# Patient Record
Sex: Male | Born: 1960 | Race: Black or African American | Hispanic: No | Marital: Single | Smoking: Current some day smoker
Health system: Southern US, Community
[De-identification: ages and names within clinical notes are randomized; demographics above are authoritative.]

## PROBLEM LIST (undated history)

## (undated) DIAGNOSIS — G4730 Sleep apnea, unspecified: Secondary | ICD-10-CM

## (undated) DIAGNOSIS — J45909 Unspecified asthma, uncomplicated: Secondary | ICD-10-CM

## (undated) DIAGNOSIS — Z789 Other specified health status: Secondary | ICD-10-CM

## (undated) HISTORY — PX: APPENDECTOMY: SHX54

---
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
## 2003-09-15 ENCOUNTER — Emergency Department (HOSPITAL_COMMUNITY): Admission: AD | Admit: 2003-09-15 | Discharge: 2003-09-15 | Payer: Self-pay | Admitting: Family Medicine

## 2003-09-17 ENCOUNTER — Emergency Department (HOSPITAL_COMMUNITY): Admission: EM | Admit: 2003-09-17 | Discharge: 2003-09-17 | Payer: Self-pay | Admitting: Emergency Medicine

## 2008-10-30 ENCOUNTER — Emergency Department (HOSPITAL_COMMUNITY): Admission: EM | Admit: 2008-10-30 | Discharge: 2008-10-30 | Payer: Self-pay | Admitting: Emergency Medicine

## 2010-12-15 ENCOUNTER — Emergency Department (HOSPITAL_BASED_OUTPATIENT_CLINIC_OR_DEPARTMENT_OTHER)
Admission: EM | Admit: 2010-12-15 | Discharge: 2010-12-15 | Disposition: A | Discharge status: Home / Self Care | Payer: Self-pay | Attending: Emergency Medicine | Admitting: Emergency Medicine

## 2010-12-15 DIAGNOSIS — J039 Acute tonsillitis, unspecified: Secondary | ICD-10-CM | POA: Insufficient documentation

## 2011-05-09 ENCOUNTER — Encounter: Payer: Self-pay | Admitting: *Deleted

## 2011-05-09 ENCOUNTER — Emergency Department (HOSPITAL_BASED_OUTPATIENT_CLINIC_OR_DEPARTMENT_OTHER)
Admission: EM | Admit: 2011-05-09 | Discharge: 2011-05-09 | Disposition: A | Discharge status: Home / Self Care | Payer: Self-pay | Attending: Emergency Medicine | Admitting: Emergency Medicine

## 2011-05-09 DIAGNOSIS — M7060 Trochanteric bursitis, unspecified hip: Secondary | ICD-10-CM

## 2011-05-09 DIAGNOSIS — M76899 Other specified enthesopathies of unspecified lower limb, excluding foot: Secondary | ICD-10-CM | POA: Insufficient documentation

## 2011-05-09 DIAGNOSIS — M549 Dorsalgia, unspecified: Secondary | ICD-10-CM | POA: Insufficient documentation

## 2011-05-09 DIAGNOSIS — A5903 Trichomonal cystitis and urethritis: Secondary | ICD-10-CM

## 2011-05-09 DIAGNOSIS — M5430 Sciatica, unspecified side: Secondary | ICD-10-CM | POA: Insufficient documentation

## 2011-05-09 DIAGNOSIS — M25559 Pain in unspecified hip: Secondary | ICD-10-CM | POA: Insufficient documentation

## 2011-05-09 DIAGNOSIS — N342 Other urethritis: Secondary | ICD-10-CM | POA: Insufficient documentation

## 2011-05-09 LAB — URINE MICROSCOPIC-ADD ON

## 2011-05-09 LAB — URINALYSIS, ROUTINE W REFLEX MICROSCOPIC
Nitrite: NEGATIVE
Specific Gravity, Urine: 1.015 (ref 1.005–1.030)
Urobilinogen, UA: 0.2 mg/dL (ref 0.0–1.0)
pH: 5.5 (ref 5.0–8.0)

## 2011-05-09 MED ORDER — HYDROCODONE-ACETAMINOPHEN 10-325 MG PO TABS: 1.0000 | ORAL_TABLET | Freq: Four times a day (QID) | ORAL | Status: AC | PRN

## 2011-05-09 MED ORDER — NAPROXEN SODIUM 220 MG PO TABS: 440.0000 mg | ORAL_TABLET | Freq: Two times a day (BID) | ORAL | Status: DC

## 2011-05-09 MED ORDER — METRONIDAZOLE 500 MG PO TABS
ORAL_TABLET | ORAL | Status: AC
  Filled 2011-05-09: qty 4

## 2011-05-09 MED ORDER — METRONIDAZOLE 500 MG PO TABS
2000.0000 mg | ORAL_TABLET | Freq: Once | ORAL | Status: AC
  Administered 2011-05-09: 2000 mg via ORAL

## 2011-05-09 NOTE — ED Provider Notes (Addendum)
History     Chief Complaint  Patient presents with  . Hip Pain  . Back Pain   Patient is a 50 y.o. male presenting with back pain. The history is provided by the patient.  Back Pain  This is a new problem. The current episode started more than 2 days ago. The problem has been gradually worsening. The pain is associated with no known injury. The pain is present in the sacro-iliac joint. The quality of the pain is described as shooting. Radiates to: right buttock and thigh. Pain severity now: moderate to severe. Exacerbated by: lying still, walking, palpation. Pertinent negatives include no numbness, no perianal numbness, no bladder incontinence, no dysuria and no weakness. He has tried NSAIDs for the symptoms. The treatment provided no relief.    History reviewed. No pertinent past medical history.  Past Surgical History  Procedure Date  . Appendectomy     History reviewed. No pertinent family history.  History  Substance Use Topics  . Smoking status: Passive Smoker  . Smokeless tobacco: Not on file  . Alcohol Use: Yes      Review of Systems  Genitourinary: Positive for discharge. Negative for bladder incontinence and dysuria.  Musculoskeletal: Positive for back pain.       Right greater trochanteric pain, improving  Neurological: Negative for weakness and numbness.  All other systems reviewed and are negative.    Physical Exam  BP 159/97  Pulse 79  Temp(Src) 98 F (36.7 C) (Oral)  Resp 18  Ht 6\' 2"  (1.88 m)  Wt 230 lb (104.327 kg)  BMI 29.53 kg/m2  SpO2 100%  Physical Exam  Constitutional: He appears well-developed and well-nourished.  HENT:  Head: Normocephalic and atraumatic.  Eyes: EOM are normal. Pupils are equal, round, and reactive to light.  Neck: Normal range of motion. Neck supple.  Cardiovascular: Normal rate and regular rhythm.   Pulmonary/Chest: Effort normal. No respiratory distress.  Abdominal: Soft. He exhibits no distension.  Genitourinary:  Uncircumcised. Discharge found.  Musculoskeletal: Normal range of motion.       Right SI joint tenderness; mild right greater trochanteric tenderness  Neurological: He is alert. Coordination normal.  Skin: Skin is warm and dry.    ED Course  Procedures  MDM       Hanley Seamen, MD 05/09/11 [phone]  Trichomoniasis noted on U/A--will treat and advised him to have partner(s) treated.   Hanley Seamen, MD 05/09/11 0425

## 2011-05-09 NOTE — ED Notes (Signed)
C/O lower back pain and right hip pain since Thursday, denies injury

## 2011-05-10 LAB — GC/CHLAMYDIA PROBE AMP, GENITAL: Chlamydia, DNA Probe: NEGATIVE

## 2011-08-05 LAB — GC/CHLAMYDIA PROBE AMP, GENITAL: Chlamydia, DNA Probe: NEGATIVE

## 2011-08-05 LAB — RPR: RPR Ser Ql: NONREACTIVE

## 2011-08-16 ENCOUNTER — Emergency Department (HOSPITAL_COMMUNITY)
Admission: EM | Admit: 2011-08-16 | Discharge: 2011-08-16 | Disposition: A | Discharge status: Home / Self Care | Payer: Self-pay | Attending: Emergency Medicine | Admitting: Emergency Medicine

## 2011-08-16 DIAGNOSIS — M549 Dorsalgia, unspecified: Secondary | ICD-10-CM | POA: Insufficient documentation

## 2011-08-18 ENCOUNTER — Ambulatory Visit (INDEPENDENT_AMBULATORY_CARE_PROVIDER_SITE_OTHER): Payer: Self-pay | Admitting: Family Medicine

## 2011-08-18 ENCOUNTER — Ambulatory Visit (HOSPITAL_BASED_OUTPATIENT_CLINIC_OR_DEPARTMENT_OTHER)
Admission: RE | Admit: 2011-08-18 | Discharge: 2011-08-18 | Disposition: A | Discharge status: Home / Self Care | Payer: Self-pay | Source: Ambulatory Visit | Attending: Family Medicine | Admitting: Family Medicine

## 2011-08-18 ENCOUNTER — Encounter: Payer: Self-pay | Admitting: Family Medicine

## 2011-08-18 VITALS — BP 149/85 | HR 93 | Temp 98.5°F | Ht 74.0 in | Wt 220.0 lb

## 2011-08-18 DIAGNOSIS — M545 Low back pain: Secondary | ICD-10-CM

## 2011-08-18 DIAGNOSIS — M549 Dorsalgia, unspecified: Secondary | ICD-10-CM

## 2011-08-18 DIAGNOSIS — M47817 Spondylosis without myelopathy or radiculopathy, lumbosacral region: Secondary | ICD-10-CM | POA: Insufficient documentation

## 2011-08-18 MED ORDER — CYCLOBENZAPRINE HCL 10 MG PO TABS: 10.0000 mg | ORAL_TABLET | Freq: Three times a day (TID) | ORAL | Status: AC | PRN

## 2011-08-18 MED ORDER — MELOXICAM 15 MG PO TABS: 15.0000 mg | ORAL_TABLET | Freq: Every day | ORAL | Status: AC

## 2011-08-18 MED ORDER — HYDROCODONE-ACETAMINOPHEN 5-500 MG PO TABS: 1.0000 | ORAL_TABLET | Freq: Four times a day (QID) | ORAL | Status: AC | PRN

## 2011-08-18 NOTE — Patient Instructions (Signed)
Your exam and history is consistent with sacroiliitis but you also have signs of an irritated nerve in your low back. Continue the prednisone as you have been. After this is gone, start meloxicam 15mg  daily with food. Take flexeril as needed for muscle spasms. Vicodin as needed for pain (no driving on this medicine). Start physical therapy for both your SI joint and irritated nerve in your low back. Strengthening of low back muscles, abdominal musculature are key for long term pain relief. Follow up with me in 1 month. If not improving, will consider further imaging (MRI) and/or other medications (neurontin, lyrica, nortriptyline) that help with pain.

## 2011-08-21 ENCOUNTER — Encounter: Payer: Self-pay | Admitting: Family Medicine

## 2011-08-21 NOTE — Progress Notes (Signed)
  Subjective:    Patient ID: Allen Sawyer, male    DOB: 1961/02/17, 50 y.o.   MRN: [id number]  PCP: None  HPI 50 yo M here with right low back, hip pain.  Patient reports off and on low back pain since age 50. Has become more intense and worsened over past 2 years. Feels pain mostly in right low back into right hip. Pain is better sleeping on his right side though. Some numbness into right leg posteriorly. No bowel/bladder dysfunction. Had x-rays on back a long time ago - questionable 'tiny fractures' in tailbone Has not done PT, had back injections. Starting to feel pain going into left buttock now too. Went to ED on 10/16 - given prednisone dose pack and norco for pain.  History reviewed. No pertinent past medical history.  Current Outpatient Prescriptions on File Prior to Visit  Medication Sig Dispense Refill  . ibuprofen (ADVIL,MOTRIN) 800 MG tablet Take 800 mg by mouth every 8 (eight) hours as needed.          Past Surgical History  Procedure Date  . Appendectomy     No Known Allergies  History   Social History  . Marital Status: Married    Spouse Name: N/A    Number of Children: N/A  . Years of Education: N/A   Occupational History  . Not on file.   Social History Main Topics  . Smoking status: Current Everyday Smoker    Types: Cigarettes  . Smokeless tobacco: Not on file   Comment: 3 cigarettes per day  . Alcohol Use: Yes  . Drug Use: Yes    Special: Marijuana  . Sexually Active: Not on file   Other Topics Concern  . Not on file   Social History Narrative  . No narrative on file    Family History  Problem Relation Age of Onset  . Hyperlipidemia Mother   . Hypertension Mother   . Hyperlipidemia Father   . Hypertension Father   . Heart attack Father   . Hyperlipidemia Sister   . Hypertension Sister   . Hypertension Brother   . Hyperlipidemia Brother   . Diabetes Brother   . Heart attack Paternal Uncle   . Sudden death Neg Hx     BP  149/85  Pulse 93  Temp(Src) 98.5 F (36.9 C) (Oral)  Ht 6\' 2"  (1.88 m)  Wt 220 lb (99.791 kg)  BMI 28.25 kg/m2   Review of Systems See HPI above.    Objective:   Physical Exam Gen: NAD Back: No gross deformity, scoliosis. TTP R lumbar paraspinal region and buttock.  TTP at R SI joint.  No midline or bony TTP.  No trochanteric TTP.  No groin or other TTP. FROM with mild pain on full flexion and extension. Strength LEs 5/5 all muscle groups.   2+ MSRs in patellar and achilles tendons, equal bilaterally. Negative SLRs. Sensation intact to light touch bilaterally. Negative logroll bilateral hips Fabers reproduces pain at [facility] joint on right.  Negative left fabers and bilateral piriformis stretches.    Assessment & Plan:  1. Low back pain - most consistent with sacroiliac dysfunction though he also describes some symptoms of lumbar radiculopathy.  Continue with prednisone dose pack then transition to mobic.  Flexeril for spasms and vicodin for severe pain.  Start PT for both of the above.  F/u in 1 month for recheck.  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

## 2011-08-22 ENCOUNTER — Ambulatory Visit: Payer: Self-pay | Admitting: Physical Therapy

## 2011-08-22 DIAGNOSIS — M549 Dorsalgia, unspecified: Secondary | ICD-10-CM | POA: Insufficient documentation

## 2011-08-22 NOTE — Assessment & Plan Note (Signed)
most consistent with sacroiliac dysfunction though he also describes some symptoms of lumbar radiculopathy.  Continue with prednisone dose pack then transition to mobic.  Flexeril for spasms and vicodin for severe pain.  Start PT for both of the above.  F/u in 1 month for recheck.  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

## 2012-02-03 ENCOUNTER — Encounter (HOSPITAL_BASED_OUTPATIENT_CLINIC_OR_DEPARTMENT_OTHER): Payer: Self-pay | Admitting: Emergency Medicine

## 2012-02-03 ENCOUNTER — Emergency Department (HOSPITAL_BASED_OUTPATIENT_CLINIC_OR_DEPARTMENT_OTHER)
Admission: EM | Admit: 2012-02-03 | Discharge: 2012-02-03 | Disposition: A | Discharge status: Home / Self Care | Payer: Self-pay | Attending: Emergency Medicine | Admitting: Emergency Medicine

## 2012-02-03 ENCOUNTER — Emergency Department (INDEPENDENT_AMBULATORY_CARE_PROVIDER_SITE_OTHER): Payer: Self-pay

## 2012-02-03 DIAGNOSIS — R05 Cough: Secondary | ICD-10-CM

## 2012-02-03 DIAGNOSIS — J3489 Other specified disorders of nose and nasal sinuses: Secondary | ICD-10-CM

## 2012-02-03 DIAGNOSIS — J40 Bronchitis, not specified as acute or chronic: Secondary | ICD-10-CM | POA: Insufficient documentation

## 2012-02-03 DIAGNOSIS — R0989 Other specified symptoms and signs involving the circulatory and respiratory systems: Secondary | ICD-10-CM

## 2012-02-03 MED ORDER — IPRATROPIUM BROMIDE 0.02 % IN SOLN
0.5000 mg | Freq: Once | RESPIRATORY_TRACT | Status: AC
  Administered 2012-02-03: 0.5 mg via RESPIRATORY_TRACT
  Filled 2012-02-03: qty 2.5

## 2012-02-03 MED ORDER — ALBUTEROL SULFATE (5 MG/ML) 0.5% IN NEBU
5.0000 mg | INHALATION_SOLUTION | Freq: Once | RESPIRATORY_TRACT | Status: AC
  Administered 2012-02-03: 5 mg via RESPIRATORY_TRACT
  Filled 2012-02-03: qty 1

## 2012-02-03 MED ORDER — ALBUTEROL SULFATE HFA 108 (90 BASE) MCG/ACT IN AERS
2.0000 | INHALATION_SPRAY | RESPIRATORY_TRACT | Status: DC | PRN
  Administered 2012-02-03: 2 via RESPIRATORY_TRACT
  Filled 2012-02-03: qty 6.7

## 2012-02-03 NOTE — Discharge Instructions (Signed)
Bronchitis Bronchitis is the body's way of reacting to injury and/or infection (inflammation) of the bronchi. Bronchi are the air tubes that extend from the windpipe into the lungs. If the inflammation becomes severe, it may cause shortness of breath. CAUSES  Inflammation may be caused by:  A virus.   Germs (bacteria).   Dust.   Allergens.   Pollutants and many other irritants.  The cells lining the bronchial tree are covered with tiny hairs (cilia). These constantly beat upward, away from the lungs, toward the mouth. This keeps the lungs free of pollutants. When these cells become too irritated and are unable to do their job, mucus begins to develop. This causes the characteristic cough of bronchitis. The cough clears the lungs when the cilia are unable to do their job. Without either of these protective mechanisms, the mucus would settle in the lungs. Then you would develop pneumonia. Smoking is a common cause of bronchitis and can contribute to pneumonia. Stopping this habit is the single most important thing you can do to help yourself. TREATMENT   Your caregiver may prescribe an antibiotic if the cough is caused by bacteria. Also, medicines that open up your airways make it easier to breathe. Your caregiver may also recommend or prescribe an expectorant. It will loosen the mucus to be coughed up. Only take over-the-counter or prescription medicines for pain, discomfort, or fever as directed by your caregiver.   Removing whatever causes the problem (smoking, for example) is critical to preventing the problem from getting worse.   Cough suppressants may be prescribed for relief of cough symptoms.   Inhaled medicines may be prescribed to help with symptoms now and to help prevent problems from returning.   For those with recurrent (chronic) bronchitis, there may be a need for steroid medicines.  SEEK IMMEDIATE MEDICAL CARE IF:   During treatment, you develop more pus-like mucus  (purulent sputum).   You become progressively more ill.   You have increased difficulty breathing, wheezing, or shortness of breath.   MAKE SURE YOU:   Understand these instructions.   Will watch your condition.   Will get help right away if you are not doing well or get worse.  Document Released: 10/17/2005 Document Revised: 10/06/2011 Document Reviewed: 08/26/2008 [employer] Patient Information 2012 [employer]. 

## 2012-02-03 NOTE — ED Provider Notes (Addendum)
History     CSN: [id number]  Arrival date & time 02/03/12  0247   First MD Initiated Contact with Patient 02/03/12 0255      Chief Complaint  Patient presents with  . Cough    (Consider location/radiation/quality/duration/timing/severity/associated sxs/prior treatment) HPI Is a 51 year old black male. He states he had the flu in December. He states that most the symptoms resolved but since then he's had persistent postnasal drip and a cough. He also become short of breath at times although he has not noticed that this is exacerbated by exertion. He has tried Mucinex without relief. The symptoms are moderate but not severe. He denies productive cough. He is a smoker but states he only smokes a few cigarettes every day.  History reviewed. No pertinent past medical history.  Past Surgical History  Procedure Date  . Appendectomy     Family History  Problem Relation Age of Onset  . Hyperlipidemia Mother   . Hypertension Mother   . Hyperlipidemia Father   . Hypertension Father   . Heart attack Father   . Hyperlipidemia Sister   . Hypertension Sister   . Hypertension Brother   . Hyperlipidemia Brother   . Diabetes Brother   . Heart attack Paternal Uncle   . Sudden death Neg Hx     History  Substance Use Topics  . Smoking status: Current Everyday Smoker    Types: Cigarettes  . Smokeless tobacco: Not on file   Comment: 3 cigarettes per day  . Alcohol Use: Yes      Review of Systems  All other systems reviewed and are negative.    Allergies  Review of patient's allergies indicates no known allergies.  Home Medications   Current Outpatient Rx  Name Route Sig Dispense Refill  . IBUPROFEN 800 MG PO TABS Oral Take 800 mg by mouth every 8 (eight) hours as needed.      . CYCLOBENZAPRINE HCL 10 MG PO TABS Oral Take 1 tablet (10 mg total) by mouth every 8 (eight) hours as needed for muscle spasms. 60 tablet 1  . MELOXICAM 15 MG PO TABS Oral Take 1 tablet (15 mg total)  by mouth daily. With food.  Start AFTER finishing prednisone. 30 tablet 1    BP 143/96  Pulse 75  Temp(Src) 98.2 F (36.8 C) (Oral)  Resp 20  Wt 215 lb (97.523 kg)  SpO2 98%  Physical Exam General: Well-developed, well-nourished male in no acute distress; appearance consistent with age of record HENT: normocephalic, atraumatic; no pharyngeal erythema or exudate; normal-appearing nasal mucosa Eyes: pupils equal round and reactive to light; extraocular muscles intact Neck: supple Heart: regular rate and rhythm Lungs: Wheezing on inspiration and expiration Abdomen: soft; nondistended Extremities: No deformity; full range of motion Neurologic: Awake, alert and oriented; motor function intact in all extremities and symmetric; no facial droop Skin: Warm and dry Psychiatric: Normal mood and affect    ED Course  Procedures (including critical care time)     MDM  3:49 AM Lungs clear after albuterol and Atrovent neb treatment.   Dg Chest 2 View  02/03/2012  *RADIOLOGY REPORT*  Clinical Data: Cough, congestion, sinus drainage.  CHEST - 2 VIEW  Comparison: 10/30/2008  Findings: Normal heart size and pulmonary vascularity.  Lungs appear clear expanded.  No focal airspace consolidation.  No blunting of costophrenic angles.  No pneumothorax.  Mild hyperinflation.  Degenerative changes in the spine.  No significant change since previous study.  IMPRESSION: No evidence of  active pulmonary disease.  Original Report Authenticated By: Marlon Pel, M.D.       Hanley Seamen, MD 02/03/12 0345  Hanley Seamen, MD 02/03/12 [phone]

## 2012-02-03 NOTE — ED Notes (Signed)
Pt c/o sinus drainage and cough since jan.

## 2014-02-28 ENCOUNTER — Emergency Department (HOSPITAL_BASED_OUTPATIENT_CLINIC_OR_DEPARTMENT_OTHER): Payer: No Typology Code available for payment source

## 2014-02-28 ENCOUNTER — Emergency Department (HOSPITAL_BASED_OUTPATIENT_CLINIC_OR_DEPARTMENT_OTHER)
Admission: EM | Admit: 2014-02-28 | Discharge: 2014-02-28 | Disposition: A | Discharge status: Home / Self Care | Payer: No Typology Code available for payment source | Attending: Emergency Medicine | Admitting: Emergency Medicine

## 2014-02-28 ENCOUNTER — Encounter (HOSPITAL_BASED_OUTPATIENT_CLINIC_OR_DEPARTMENT_OTHER): Payer: Self-pay | Admitting: Emergency Medicine

## 2014-02-28 DIAGNOSIS — J40 Bronchitis, not specified as acute or chronic: Secondary | ICD-10-CM

## 2014-02-28 DIAGNOSIS — J209 Acute bronchitis, unspecified: Secondary | ICD-10-CM | POA: Insufficient documentation

## 2014-02-28 DIAGNOSIS — Z87891 Personal history of nicotine dependence: Secondary | ICD-10-CM | POA: Insufficient documentation

## 2014-02-28 MED ORDER — AZITHROMYCIN 250 MG PO TABS: ORAL_TABLET | ORAL | Status: DC

## 2014-02-28 MED ORDER — PREDNISONE 50 MG PO TABS
60.0000 mg | ORAL_TABLET | Freq: Once | ORAL | Status: AC
  Administered 2014-02-28: 60 mg via ORAL
  Filled 2014-02-28 (×2): qty 1

## 2014-02-28 MED ORDER — PREDNISONE 20 MG PO TABS: ORAL_TABLET | ORAL | Status: DC

## 2014-02-28 MED ORDER — ALBUTEROL SULFATE HFA 108 (90 BASE) MCG/ACT IN AERS
2.0000 | INHALATION_SPRAY | Freq: Once | RESPIRATORY_TRACT | Status: AC
Start: 2014-02-28 — End: 2014-02-28
  Administered 2014-02-28: 2 via RESPIRATORY_TRACT
  Filled 2014-02-28: qty 6.7

## 2014-02-28 NOTE — ED Provider Notes (Signed)
CSN: [id number]     Arrival date & time 02/28/14  1400 History   First MD Initiated Contact with Patient 02/28/14 1412     Chief Complaint  Patient presents with  . Cough     (Consider location/radiation/quality/duration/timing/severity/associated sxs/prior Treatment) HPI Comments: Patient presents with cough and congestion. He states it's been gone on for 2-3 weeks. His cough is productive of some white sputum. He denies any chest pain. He has some shortness of breath during coughing spells. He's also noticed some wheezing. He smokes occasionally. He denies any fevers or chills. He denies any vomiting or diarrhea. He has had some problems with allergies and is been using Allegra for that. This includes rhinorrhea and postnasal drip. He denies a sore throat.  Patient is a 53 y.o. male presenting with cough.  Cough Associated symptoms: rhinorrhea and wheezing   Associated symptoms: no chest pain, no chills, no diaphoresis, no fever, no headaches, no rash and no shortness of breath     History reviewed. No pertinent past medical history. Past Surgical History  Procedure Laterality Date  . Appendectomy     Family History  Problem Relation Age of Onset  . Hyperlipidemia Mother   . Hypertension Mother   . Hyperlipidemia Father   . Hypertension Father   . Heart attack Father   . Hyperlipidemia Sister   . Hypertension Sister   . Hypertension Brother   . Hyperlipidemia Brother   . Diabetes Brother   . Heart attack Paternal Uncle   . Sudden death Neg Hx    History  Substance Use Topics  . Smoking status: Former Games developermoker  . Smokeless tobacco: Not on file     Comment: 3 cigarettes per day  . Alcohol Use: Yes    Review of Systems  Constitutional: Negative for fever, chills, diaphoresis and fatigue.  HENT: Positive for congestion, postnasal drip, rhinorrhea and sneezing.   Eyes: Negative.   Respiratory: Positive for cough and wheezing. Negative for chest tightness and shortness of  breath.   Cardiovascular: Negative for chest pain and leg swelling.  Gastrointestinal: Negative for nausea, vomiting, abdominal pain, diarrhea and blood in stool.  Genitourinary: Negative for frequency, hematuria, flank pain and difficulty urinating.  Musculoskeletal: Negative for arthralgias and back pain.  Skin: Negative for rash.  Neurological: Negative for dizziness, speech difficulty, weakness, numbness and headaches.      Allergies  Review of patient's allergies indicates no known allergies.  Home Medications   Prior to Admission medications   Medication Sig Start Date End Date Taking? Authorizing Provider  Fexofenadine-Pseudoephedrine (ALLEGRA-D PO) Take by mouth.   Yes Historical Provider, MD  ibuprofen (ADVIL,MOTRIN) 800 MG tablet Take 800 mg by mouth every 8 (eight) hours as needed.      Historical Provider, MD   BP 165/97  Pulse 88  Temp(Src) 98.2 F (36.8 C) (Oral)  Resp 18  Ht 6\' 2"  (1.88 m)  Wt 215 lb (97.523 kg)  BMI 27.59 kg/m2  SpO2 95% Physical Exam  Constitutional: He is oriented to person, place, and time. He appears well-developed and well-nourished.  HENT:  Head: Normocephalic and atraumatic.  Mouth/Throat: Oropharynx is clear and moist.  Eyes: Pupils are equal, round, and reactive to light.  Neck: Normal range of motion. Neck supple.  Cardiovascular: Normal rate, regular rhythm and normal heart sounds.   Pulmonary/Chest: Effort normal. No respiratory distress. He has wheezes (few scarce wheezes). He has no rales. He exhibits no tenderness.  Abdominal: Soft. Bowel sounds are  normal. There is no tenderness. There is no rebound and no guarding.  Musculoskeletal: Normal range of motion. He exhibits no edema.  No calf tenderness  Lymphadenopathy:    He has no cervical adenopathy.  Neurological: He is alert and oriented to person, place, and time.  Skin: Skin is warm and dry. No rash noted.  Psychiatric: He has a normal mood and affect.    ED Course   Procedures (including critical care time) Labs Review Labs Reviewed - No data to display  Imaging Review Dg Chest 2 View  02/28/2014   CLINICAL DATA:  Cough and congestion.  EXAM: CHEST  2 VIEW  COMPARISON:  02/03/2012 and prior chest radiographs  FINDINGS: The cardiomediastinal silhouette is unremarkable.  Lungs are clear.  There is no evidence of focal airspace disease, pulmonary edema, suspicious pulmonary nodule/mass, pleural effusion, or pneumothorax. No acute bony abnormalities are identified.  IMPRESSION: No active cardiopulmonary disease.   Electronically Signed   By: Laveda AbbeJeff  Hu M.D.   On: 02/28/2014 15:14     EKG Interpretation None      MDM   Final diagnoses:  Bronchitis    Patient presents with a 2 to three-week history of worsening cough and wheezing. He had some mild wheezing on exam and he was given an albuterol inhaler to use at home. He was given a five-day course of prednisone. His chest x-ray is negative for pneumonia but given the duration of symptoms, we'll go ahead and treated with Zithromax for possible atypical pneumonia or bronchitis. He was discharged in good condition I personally called the primary care physician or return here as needed if his symptoms worsen.    Rolan BuccoMelanie Angelie Kram, MD 02/28/14 [phone]

## 2014-02-28 NOTE — ED Notes (Signed)
C/o prod cough x 2-3 weeks

## 2014-02-28 NOTE — ED Notes (Signed)
MD at bedside. 

## 2014-02-28 NOTE — Discharge Instructions (Signed)

## 2015-03-17 ENCOUNTER — Emergency Department (HOSPITAL_BASED_OUTPATIENT_CLINIC_OR_DEPARTMENT_OTHER)
Admission: EM | Admit: 2015-03-17 | Discharge: 2015-03-17 | Discharge status: Against Medical Advice | Payer: No Typology Code available for payment source | Attending: Emergency Medicine | Admitting: Emergency Medicine

## 2015-03-17 ENCOUNTER — Encounter (HOSPITAL_BASED_OUTPATIENT_CLINIC_OR_DEPARTMENT_OTHER): Payer: Self-pay

## 2015-03-17 DIAGNOSIS — R062 Wheezing: Secondary | ICD-10-CM | POA: Insufficient documentation

## 2015-03-17 DIAGNOSIS — R059 Cough, unspecified: Secondary | ICD-10-CM

## 2015-03-17 DIAGNOSIS — Z8709 Personal history of other diseases of the respiratory system: Secondary | ICD-10-CM | POA: Insufficient documentation

## 2015-03-17 DIAGNOSIS — R05 Cough: Secondary | ICD-10-CM | POA: Insufficient documentation

## 2015-03-17 DIAGNOSIS — Z87891 Personal history of nicotine dependence: Secondary | ICD-10-CM | POA: Insufficient documentation

## 2015-03-17 MED ORDER — ALBUTEROL SULFATE HFA 108 (90 BASE) MCG/ACT IN AERS
2.0000 | INHALATION_SPRAY | Freq: Once | RESPIRATORY_TRACT | Status: AC
Start: 2015-03-17 — End: 2015-03-17
  Administered 2015-03-17: 2 via RESPIRATORY_TRACT
  Filled 2015-03-17: qty 6.7

## 2015-03-17 NOTE — ED Notes (Signed)
Pt observed walking out doors of ED. This RN calls registration, Wonda AmisJohnny Mae states "he just walked right past us out the doors, got in his car and left."

## 2015-03-17 NOTE — ED Provider Notes (Signed)
CSN: [id number]     Arrival date & time 03/17/15  1059 History   First MD Initiated Contact with Patient 03/17/15 1307     Chief Complaint  Patient presents with  . URI     (Consider location/radiation/quality/duration/timing/severity/associated sxs/prior Treatment) Patient is a 54 y.o. male presenting with cough.  Cough Cough characteristics:  Productive Sputum characteristics:  Clear Severity:  Moderate Onset quality:  Gradual Duration:  3 weeks Timing:  Intermittent Progression:  Unchanged Chronicity:  Recurrent Context: upper respiratory infection (3 weeks ago, these symptoms have resolved)   Relieved by: better with albuterol inhaler that he had left over. Worsened by:  Nothing tried Associated symptoms: no chest pain, no fever, no myalgias, no shortness of breath and no sinus congestion     History reviewed. No pertinent past medical history. Past Surgical History  Procedure Laterality Date  . Appendectomy     Family History  Problem Relation Age of Onset  . Hyperlipidemia Mother   . Hypertension Mother   . Hyperlipidemia Father   . Hypertension Father   . Heart attack Father   . Hyperlipidemia Sister   . Hypertension Sister   . Hypertension Brother   . Hyperlipidemia Brother   . Diabetes Brother   . Heart attack Paternal Uncle   . Sudden death Neg Hx    History  Substance Use Topics  . Smoking status: Former Games developermoker  . Smokeless tobacco: Not on file  . Alcohol Use: Yes    Review of Systems  Constitutional: Negative for fever.  Respiratory: Positive for cough. Negative for shortness of breath.   Cardiovascular: Negative for chest pain.  Musculoskeletal: Negative for myalgias.  All other systems reviewed and are negative.     Allergies  Review of patient's allergies indicates no known allergies.  Home Medications   Prior to Admission medications   Medication Sig Start Date End Date Taking? Authorizing Provider  Loratadine-Pseudoephedrine  (CLARITIN-D 24 HOUR PO) Take by mouth.   Yes Historical Provider, MD   BP 156/84 mmHg  Pulse 80  Temp(Src) 98.4 F (36.9 C) (Oral)  Resp 18  Ht 6\' 2"  (1.88 m)  Wt 225 lb (102.059 kg)  BMI 28.88 kg/m2  SpO2 98% Physical Exam  Constitutional: He is oriented to person, place, and time. He appears well-developed and well-nourished. No distress.  HENT:  Head: Normocephalic and atraumatic.  Mouth/Throat: Oropharynx is clear and moist.  Eyes: Conjunctivae are normal. Pupils are equal, round, and reactive to light. No scleral icterus.  Neck: Neck supple.  Cardiovascular: Normal rate, regular rhythm, normal heart sounds and intact distal pulses.   No murmur heard. Pulmonary/Chest: Effort normal. No stridor. No respiratory distress. He has wheezes (mild diffuse). He has no rales.  Abdominal: Soft. He exhibits no distension. There is no tenderness.  Musculoskeletal: Normal range of motion. He exhibits no edema.  Neurological: He is alert and oriented to person, place, and time.  Skin: Skin is warm and dry. No rash noted.  Psychiatric: He has a normal mood and affect. His behavior is normal.  Nursing note and vitals reviewed.   ED Course  Procedures (including critical care time) Labs Review Labs Reviewed - No data to display  Imaging Review No results found.   EKG Interpretation None      MDM   Final diagnoses:  Cough  Wheezing    54 yo male with persistent cough after a URI three weeks ago.  URI symptoms have resolved, cough has remained.  He  has some wheezing on exam.  He used an old inhaler, which helped, but he did not want to keep using it without Dr's order.  Very well appearing on exam.  No increased WOB.  Don't think he needs CXR or labs.  Will try inhaler.    Pt left after getting inhaler prior to re-eval.      Blake DivineJohn Amandajo Gonder, MD 03/18/15 [phone]

## 2015-03-17 NOTE — ED Notes (Signed)
C/o head and chest congestion with cough x 3 weeks

## 2015-05-17 ENCOUNTER — Emergency Department (HOSPITAL_BASED_OUTPATIENT_CLINIC_OR_DEPARTMENT_OTHER): Payer: No Typology Code available for payment source

## 2015-05-17 ENCOUNTER — Encounter (HOSPITAL_BASED_OUTPATIENT_CLINIC_OR_DEPARTMENT_OTHER): Payer: Self-pay | Admitting: *Deleted

## 2015-05-17 ENCOUNTER — Emergency Department (HOSPITAL_BASED_OUTPATIENT_CLINIC_OR_DEPARTMENT_OTHER)
Admission: EM | Admit: 2015-05-17 | Discharge: 2015-05-17 | Disposition: A | Discharge status: Home / Self Care | Payer: No Typology Code available for payment source | Attending: Emergency Medicine | Admitting: Emergency Medicine

## 2015-05-17 DIAGNOSIS — Z87891 Personal history of nicotine dependence: Secondary | ICD-10-CM | POA: Insufficient documentation

## 2015-05-17 DIAGNOSIS — R05 Cough: Secondary | ICD-10-CM

## 2015-05-17 DIAGNOSIS — R059 Cough, unspecified: Secondary | ICD-10-CM

## 2015-05-17 DIAGNOSIS — R61 Generalized hyperhidrosis: Secondary | ICD-10-CM | POA: Insufficient documentation

## 2015-05-17 DIAGNOSIS — J9801 Acute bronchospasm: Secondary | ICD-10-CM | POA: Insufficient documentation

## 2015-05-17 MED ORDER — ALBUTEROL SULFATE HFA 108 (90 BASE) MCG/ACT IN AERS
4.0000 | INHALATION_SPRAY | Freq: Once | RESPIRATORY_TRACT | Status: AC
  Administered 2015-05-17: 4 via RESPIRATORY_TRACT
  Filled 2015-05-17: qty 6.7

## 2015-05-17 MED ORDER — FLUTICASONE PROPIONATE 50 MCG/ACT NA SUSP: 2.0000 | Freq: Every day | NASAL | Status: DC

## 2015-05-17 MED ORDER — LORATADINE 10 MG PO TABS: 10.0000 mg | ORAL_TABLET | Freq: Every day | ORAL | Status: DC

## 2015-05-17 MED ORDER — PREDNISONE 20 MG PO TABS: 40.0000 mg | ORAL_TABLET | Freq: Every day | ORAL | Status: DC

## 2015-05-17 NOTE — ED Notes (Signed)
Pt states has chest congestion, started during the beginning of allergy season, in May, having some night sweats

## 2015-05-17 NOTE — ED Notes (Signed)
Patient transported to X-ray 

## 2015-05-17 NOTE — ED Notes (Signed)
Pt teaching done re: 3 Rx given to pt from EDP, reinforced teaching on use of MDI w/ spacer.

## 2015-05-17 NOTE — ED Notes (Signed)
MD at bedside. 

## 2015-05-17 NOTE — ED Provider Notes (Signed)
CSN: [id number]     [id number] date & time 05/17/15  [id number] History   First MD Initiated Contact with Patient 05/17/15 0703     Chief Complaint  Patient presents with  . Nasal Congestion     (Consider location/radiation/quality/duration/timing/severity/associated sxs/prior Treatment) HPI  54 year old male presents with congestion and cough for the past 2 months. Patient states the cough has white mucus. Occasionally gets sweaty but has not noticed any fevers. There is no chest pain and no shortness of breath. He states he is still active and still runs without getting short of breath. However he feels a lot of postnasal drip and the cough is bothersome. He states that every year starting around May he gets the symptoms. The patient states that last year he had very similar symptoms and was given an antibiotic and almost immediately felt better. Has tried mucinex with no relief. Former smoker. No PCP.  History reviewed. No pertinent past medical history. Past Surgical History  Procedure Laterality Date  . Appendectomy     Family History  Problem Relation Age of Onset  . Hyperlipidemia Mother   . Hypertension Mother   . Hyperlipidemia Father   . Hypertension Father   . Heart attack Father   . Hyperlipidemia Sister   . Hypertension Sister   . Hypertension Brother   . Hyperlipidemia Brother   . Diabetes Brother   . Heart attack Paternal Uncle   . Sudden death Neg Hx    History  Substance Use Topics  . Smoking status: Former Games developermoker  . Smokeless tobacco: Not on file  . Alcohol Use: Yes    Review of Systems  Constitutional: Positive for diaphoresis. Negative for fever.  HENT: Positive for congestion.   Respiratory: Positive for cough. Negative for chest tightness and shortness of breath.   Cardiovascular: Negative for chest pain.  All other systems reviewed and are negative.     Allergies  Review of patient's allergies indicates no known allergies.  Home Medications   Prior  to Admission medications   Medication Sig Start Date End Date Taking? Authorizing Provider  Loratadine-Pseudoephedrine (CLARITIN-D 24 HOUR PO) Take by mouth.    Historical Provider, MD   BP 135/80 mmHg  Pulse 81  Temp(Src) 98.1 F (36.7 C) (Oral)  Resp 18  Ht 6\' 2"  (1.88 m)  Wt 217 lb (98.431 kg)  BMI 27.85 kg/m2  SpO2 98% Physical Exam  Constitutional: He is oriented to person, place, and time. He appears well-developed and well-nourished. No distress.  HENT:  Head: Normocephalic and atraumatic.  Right Ear: External ear normal.  Left Ear: External ear normal.  Nose: Nose normal.  Eyes: Right eye exhibits no discharge. Left eye exhibits no discharge.  Neck: Neck supple.  Cardiovascular: Normal rate, regular rhythm, normal heart sounds and intact distal pulses.   Pulmonary/Chest: Effort normal. He has wheezes (diffuse expiratory wheezes).  Speaks in complete sentences without difficulty breathing  Abdominal: Soft. There is no tenderness.  Musculoskeletal: He exhibits no edema.  Neurological: He is alert and oriented to person, place, and time.  Skin: Skin is warm and dry. He is not diaphoretic.  Nursing note and vitals reviewed.   ED Course  Procedures (including critical care time) Labs Review Labs Reviewed - No data to display  Imaging Review Dg Chest 2 View  05/17/2015   CLINICAL DATA:  Cough, congestion.  Night sweats  EXAM: CHEST  2 VIEW  COMPARISON:  02/28/2014  FINDINGS: The heart size and mediastinal contours are  within normal limits. Both lungs are clear. The visualized skeletal structures are unremarkable.  IMPRESSION: No active cardiopulmonary disease.   Electronically Signed   By: Signa Kell M.D.   On: 05/17/2015 07:34     EKG Interpretation None      MDM   Final diagnoses:  Cough  Bronchospasm    Patient with subacute respiratory symptoms. Feels much better after albuterol. Most likely allergic in nature, likely some component of emphysema given  smoking history, wheezing and CXR findings. No obvious pneumonia. I do not think antibiotics are warranted. However, given his wheezing and symptoms for months, will try a prednisone burst and see if this improves symptoms. Will also recommend other treatments for allergic rhinitis. Recommend that he get a PCP, referral sheet given. Likely will need PFTs.    Pricilla Loveless, MD 05/17/15 [id number]

## 2016-12-20 ENCOUNTER — Emergency Department (HOSPITAL_BASED_OUTPATIENT_CLINIC_OR_DEPARTMENT_OTHER): Payer: No Typology Code available for payment source

## 2016-12-20 ENCOUNTER — Emergency Department (HOSPITAL_BASED_OUTPATIENT_CLINIC_OR_DEPARTMENT_OTHER)
Admission: EM | Admit: 2016-12-20 | Discharge: 2016-12-20 | Disposition: A | Discharge status: Home / Self Care | Payer: No Typology Code available for payment source | Attending: Emergency Medicine | Admitting: Emergency Medicine

## 2016-12-20 ENCOUNTER — Encounter (HOSPITAL_BASED_OUTPATIENT_CLINIC_OR_DEPARTMENT_OTHER): Payer: Self-pay

## 2016-12-20 DIAGNOSIS — Z87891 Personal history of nicotine dependence: Secondary | ICD-10-CM | POA: Insufficient documentation

## 2016-12-20 DIAGNOSIS — R12 Heartburn: Secondary | ICD-10-CM

## 2016-12-20 DIAGNOSIS — J9801 Acute bronchospasm: Secondary | ICD-10-CM | POA: Insufficient documentation

## 2016-12-20 MED ORDER — PREDNISONE 20 MG PO TABS: 40.0000 mg | ORAL_TABLET | Freq: Every day | ORAL | 0 refills | Status: DC

## 2016-12-20 MED ORDER — ALBUTEROL SULFATE 4 MG PO TABS: 2.0000 mg | ORAL_TABLET | Freq: Three times a day (TID) | ORAL | 0 refills | Status: DC | PRN

## 2016-12-20 NOTE — ED Triage Notes (Addendum)
C/o flu like sx x 2 weeks-NAD-steady gait-states was seen by PCP dx with sinusitis-rx zithromax-completed abx

## 2016-12-20 NOTE — Discharge Instructions (Signed)
For your heart burn try omeprazole 20 mg with sodium bicarbonate. [facility]. That is the generic of the brand medication Zegerid. Patient over the dietary modifications. I have listed. Heartburn can contribute to cough. Otherwise, take the medications. I have prescribed. There is no sign of infection such as pneumonia. Follow up with a primary care physician regarding all of your preventative care and long-term health needs.

## 2016-12-20 NOTE — ED Provider Notes (Signed)
[facility]   CSN: [id number] Arrival date & time: 12/20/16  1534  .By signing my name below, I, Freida Busman, attest that this documentation has been prepared under the direction and in the presence of Arthor Captain, PA-C . Electronically Signed: Freida Busman, Scribe. 12/20/2016. 7:44 PM.   History   Chief Complaint Chief Complaint  Patient presents with  . Cough   The history is provided by the patient. No language interpreter was used.     HPI Comments:  Allen Sawyer is a 56 y.o. male who presents to the Emergency Department complaining of a persistent cough x 2 weeks. He was treated with Zpack by his PCP recently but states the cough is still "intense". He notes his cough is worse at night. He has also been taking zyrtec and mucinex with little relief. Pt denies h/o asthma and smoking.   Hernia Heartburn at night  History reviewed. No pertinent past medical history.  Patient Active Problem List   Diagnosis Date Noted  . Back pain 08/22/2011    Past Surgical History:  Procedure Laterality Date  . APPENDECTOMY         Home Medications    Prior to Admission medications   Medication Sig Start Date End Date Taking? Authorizing Provider  albuterol (PROVENTIL) 4 MG tablet Take 0.5-1 tablets (2-4 mg total) by mouth 3 (three) times daily as needed (coughing and shortness of breath). 12/20/16   Arthor Captain, PA-C  predniSONE (DELTASONE) 20 MG tablet Take 2 tablets (40 mg total) by mouth daily. 12/20/16   Arthor Captain, PA-C    Family History Family History  Problem Relation Age of Onset  . Hyperlipidemia Mother   . Hypertension Mother   . Hyperlipidemia Father   . Hypertension Father   . Heart attack Father   . Hyperlipidemia Sister   . Hypertension Sister   . Hypertension Brother   . Hyperlipidemia Brother   . Diabetes Brother   . Heart attack Paternal Uncle   . Sudden death Neg Hx     Social History Social History  Substance  Use Topics  . Smoking status: Former Games developer  . Smokeless tobacco: Never Used  . Alcohol use Yes     Comment: occ     Allergies   Patient has no known allergies.   Review of Systems Review of Systems  Constitutional: Negative for fever.  Respiratory: Positive for cough.      Physical Exam Updated Vital Signs BP 123/86 (BP Location: Left Arm)   Pulse 82   Temp 98.6 F (37 C) (Oral)   Resp 18   Ht 6\' 2"  (1.88 m)   Wt 107.5 kg   SpO2 97%   BMI 30.43 kg/m   Physical Exam  Constitutional: He is oriented to person, place, and time. He appears well-developed and well-nourished. No distress.  HENT:  Head: Normocephalic and atraumatic.  Eyes: Conjunctivae are normal. No scleral icterus.  Neck: Normal range of motion. Neck supple.  Cardiovascular: Normal rate, regular rhythm and normal heart sounds.   Pulmonary/Chest: Effort normal and breath sounds normal. No respiratory distress. He has no wheezes. He has no rales.  Abdominal: Soft. He exhibits no distension. There is no tenderness.  Musculoskeletal: He exhibits no edema.  Neurological: He is alert and oriented to person, place, and time.  Skin: Skin is warm and dry. He is not diaphoretic.  Psychiatric: He has a normal mood and affect. His behavior is normal.  Nursing note and  vitals reviewed.    ED Treatments / Results  DIAGNOSTIC STUDIES:  Oxygen Saturation is 97% on RA, normal by my interpretation.    COORDINATION OF CARE:  6:03 PM Discussed treatment plan with pt at bedside and pt agreed to plan.  Labs (all labs ordered are listed, but only abnormal results are displayed) Labs Reviewed - No data to display  EKG  EKG Interpretation None       Radiology Dg Chest 2 View  Result Date: 12/20/2016 CLINICAL DATA:  Cough, congestion and sinus pressure for 2 weeks. EXAM: CHEST  2 VIEW COMPARISON:  05/17/2015 FINDINGS: The heart size and mediastinal contours are within normal limits. Both lungs are mildly  hyperexpanded but clear. No pleural effusion or pneumothorax. The visualized skeletal structures are unremarkable. IMPRESSION: No active cardiopulmonary disease. Electronically Signed   By: Amie Portlandavid  Ormond M.D.   On: 12/20/2016 17:25    Procedures Procedures (including critical care time)  Medications Ordered in ED Medications - No data to display   Initial Impression / Assessment and Plan / ED Course  I have reviewed the triage vital signs and the nursing notes.  Pertinent labs & imaging results that were available during my care of the patient were reviewed by me and considered in my medical decision making (see chart for details).     Patient with reactive airway after URI infection. Patient will be discharged with albuterol tablets and prednisone. I discussed also dietary modifications medications for treatment of reflux. Patient is advised to follow-up with her PCP for all his preventative care as well as primary care visits. Discussed return precautions.  Final Clinical Impressions(s) / ED Diagnoses   Final diagnoses:  Post-infection bronchospasm  Heartburn    New Prescriptions Discharge Medication List as of 12/20/2016  6:20 PM    START taking these medications   Details  albuterol (PROVENTIL) 4 MG tablet Take 0.5-1 tablets (2-4 mg total) by mouth 3 (three) times daily as needed (coughing and shortness of breath)., Starting Tue 12/20/2016, Print    predniSONE (DELTASONE) 20 MG tablet Take 2 tablets (40 mg total) by mouth daily., Starting Tue 12/20/2016, Print        I personally performed the services described in this documentation, which was scribed in my presence. The recorded information has been reviewed and is accurate.       Arthor Captainbigail Ronelle Smallman, PA-C 12/20/16 1945    Linwood DibblesJon Knapp, MD 12/22/16 2056

## 2017-03-11 ENCOUNTER — Emergency Department (HOSPITAL_BASED_OUTPATIENT_CLINIC_OR_DEPARTMENT_OTHER): Payer: Self-pay

## 2017-03-11 ENCOUNTER — Encounter (HOSPITAL_BASED_OUTPATIENT_CLINIC_OR_DEPARTMENT_OTHER): Payer: Self-pay | Admitting: *Deleted

## 2017-03-11 ENCOUNTER — Emergency Department (HOSPITAL_BASED_OUTPATIENT_CLINIC_OR_DEPARTMENT_OTHER)
Admission: EM | Admit: 2017-03-11 | Discharge: 2017-03-11 | Disposition: A | Discharge status: Home / Self Care | Payer: Self-pay | Attending: Emergency Medicine | Admitting: Emergency Medicine

## 2017-03-11 DIAGNOSIS — Z87891 Personal history of nicotine dependence: Secondary | ICD-10-CM | POA: Insufficient documentation

## 2017-03-11 DIAGNOSIS — R062 Wheezing: Secondary | ICD-10-CM | POA: Insufficient documentation

## 2017-03-11 DIAGNOSIS — R0982 Postnasal drip: Secondary | ICD-10-CM | POA: Insufficient documentation

## 2017-03-11 DIAGNOSIS — R05 Cough: Secondary | ICD-10-CM | POA: Insufficient documentation

## 2017-03-11 MED ORDER — IPRATROPIUM BROMIDE 0.06 % NA SOLN: 2.0000 | Freq: Four times a day (QID) | NASAL | 12 refills | Status: DC

## 2017-03-11 MED ORDER — ALBUTEROL SULFATE (2.5 MG/3ML) 0.083% IN NEBU
2.5000 mg | INHALATION_SOLUTION | Freq: Once | RESPIRATORY_TRACT | Status: AC
Start: 2017-03-11 — End: 2017-03-11
  Administered 2017-03-11: 2.5 mg via RESPIRATORY_TRACT
  Filled 2017-03-11: qty 3

## 2017-03-11 MED ORDER — PREDNISONE 50 MG PO TABS
60.0000 mg | ORAL_TABLET | Freq: Once | ORAL | Status: AC
  Administered 2017-03-11: 60 mg via ORAL
  Filled 2017-03-11: qty 1

## 2017-03-11 MED ORDER — ALBUTEROL SULFATE HFA 108 (90 BASE) MCG/ACT IN AERS: 1.0000 | INHALATION_SPRAY | Freq: Four times a day (QID) | RESPIRATORY_TRACT | 0 refills | Status: DC | PRN

## 2017-03-11 MED ORDER — PREDNISONE 20 MG PO TABS: ORAL_TABLET | ORAL | 0 refills | Status: DC

## 2017-03-11 MED ORDER — IPRATROPIUM-ALBUTEROL 0.5-2.5 (3) MG/3ML IN SOLN
3.0000 mL | Freq: Once | RESPIRATORY_TRACT | Status: AC
  Administered 2017-03-11: 3 mL via RESPIRATORY_TRACT
  Filled 2017-03-11: qty 3

## 2017-03-11 NOTE — ED Provider Notes (Signed)
[facility]   CSN: [id number] Arrival date & time: 03/11/17  0117     History   Chief Complaint Chief Complaint  Patient presents with  . Shortness of Breath    HPI Allen Sawyer is a 56 y.o. male.  The history is provided by the patient.  URI   This is a chronic problem. The current episode started more than 1 week ago. The problem has not changed since onset.There has been no fever. Associated symptoms include congestion, cough and wheezing. Pertinent negatives include no chest pain, no sneezing, no sore throat and no swollen glands. Associated symptoms comments: Drip down the back of the throat. Treatments tried: flonase. The treatment provided no relief.    History reviewed. No pertinent past medical history.  Patient Active Problem List   Diagnosis Date Noted  . Back pain 08/22/2011    Past Surgical History:  Procedure Laterality Date  . APPENDECTOMY         Home Medications    Prior to Admission medications   Medication Sig Start Date End Date Taking? Authorizing Provider  fluticasone (FLONASE) 50 MCG/ACT nasal spray Place into both nostrils daily.   Yes [provider]  loratadine (CLARITIN) 10 MG tablet Take 10 mg by mouth daily.   Yes [provider]  albuterol (PROVENTIL HFA;VENTOLIN HFA) 108 (90 Base) MCG/ACT inhaler Inhale 1-2 puffs into the lungs every 6 (six) hours as needed for wheezing or shortness of breath. 03/11/17   Asa Baudoin, MD  ipratropium (ATROVENT) 0.06 % nasal spray Place 2 sprays into both nostrils 4 (four) times daily. 03/11/17   Dhanya Bogle, MD  predniSONE (DELTASONE) 20 MG tablet 3 tabs po day one, then 2 po daily x 4 days 03/11/17   Nicanor AlconPalumbo, Helios Kohlmann, MD    Family History Family History  Problem Relation Age of Onset  . Hyperlipidemia Mother   . Hypertension Mother   . Hyperlipidemia Father   . Hypertension Father   . Heart attack Father   . Hyperlipidemia Sister   . Hypertension  Sister   . Hypertension Brother   . Hyperlipidemia Brother   . Diabetes Brother   . Heart attack Paternal Uncle   . Sudden death Neg Hx     Social History Social History  Substance Use Topics  . Smoking status: Former Games developermoker  . Smokeless tobacco: Never Used  . Alcohol use Yes     Comment: occ     Allergies   Patient has no known allergies.   Review of Systems Review of Systems  HENT: Positive for congestion and postnasal drip. Negative for sneezing and sore throat.   Respiratory: Positive for cough and wheezing. Negative for shortness of breath.   Cardiovascular: Negative for chest pain, palpitations and leg swelling.  All other systems reviewed and are negative.    Physical Exam Updated Vital Signs BP (!) 130/91 (BP Location: Right Arm)   Pulse 84   Temp 98.2 F (36.8 C) (Oral)   Resp (!) 21   Ht 6\' 2"  (1.88 m)   Wt 240 lb (108.9 kg)   SpO2 95%   BMI 30.81 kg/m   Physical Exam  Constitutional: He is oriented to person, place, and time. He appears well-developed and well-nourished. No distress.  HENT:  Head: Normocephalic and atraumatic.  Mouth/Throat: No oropharyngeal exudate.  Clear colorless post nasal drainage  Eyes: Conjunctivae are normal. Pupils are equal, round, and reactive to light.  Neck: Normal range of motion. Neck  supple.  Cardiovascular: Normal rate, regular rhythm, normal heart sounds and intact distal pulses.   Pulmonary/Chest: Effort normal and breath sounds normal. No stridor. He has no wheezes.  Abdominal: Soft. Bowel sounds are normal. He exhibits no mass. There is no tenderness. There is no rebound and no guarding.  Musculoskeletal: Normal range of motion. He exhibits no tenderness.  Neurological: He is alert and oriented to person, place, and time.  Skin: Skin is warm and dry. Capillary refill takes less than 2 seconds.  Psychiatric: He has a normal mood and affect.     ED Treatments / Results   Vitals:   03/11/17 0129  BP: (!)  130/91  Pulse: 84  Resp: (!) 21  Temp: 98.2 F (36.8 C)   Labs (all labs ordered are listed, but only abnormal results are displayed) Labs Reviewed - No data to display  EKG  EKG Interpretation  Date/Time:  Saturday Mar 11 2017 01:27:46 EDT Ventricular Rate:  85 PR Interval:    QRS Duration: 104 QT Interval:  375 QTC Calculation: 446 R Axis:   60 Text Interpretation:  Sinus rhythm Confirmed by Keavon Sensing ([id number]) on 03/11/2017 1:35:00 AM       Radiology Dg Chest 2 View  Result Date: 03/11/2017 CLINICAL DATA:  Wheezing.  Cough and congestion. EXAM: CHEST  2 VIEW COMPARISON:  12/20/2016, 05/17/2015 FINDINGS: Stable hyperinflation. The cardiomediastinal contours are normal. The lungs are clear. Pulmonary vasculature is normal. No consolidation, pleural effusion, or pneumothorax. No acute osseous abnormalities are seen. IMPRESSION: Stable chronic hyperinflation.  No acute abnormality. Electronically Signed   By: Rubye Oaks M.D.   On: 03/11/2017 01:53    Procedures Procedures (including critical care time)  Medications Ordered in ED Medications  predniSONE (DELTASONE) tablet 60 mg (not administered)  ipratropium-albuterol (DUONEB) 0.5-2.5 (3) MG/3ML nebulizer solution 3 mL (3 mLs Nebulization Given 03/11/17 0155)  albuterol (PROVENTIL) (2.5 MG/3ML) 0.083% nebulizer solution 2.5 mg (2.5 mg Nebulization Given 03/11/17 0155)     Final Clinical Impressions(s) / ED Diagnoses   Final diagnoses:  Post-nasal drip   Follow up with your doctor.  The patient is nontoxic-appearing on exam and vital signs are within normal limits.   I have reviewed the triage vital signs and the nursing notes. Pertinent labs &imaging results that were available during my care of the patient were reviewed by me and considered in my medical decision making (see chart for details). The patient is nontoxic-appearing on exam and vital signs are within normal limits. Return for fevers, chest pain  with exertion, weakness, numbness, neck pain or stiffness, inability to make or understand speech or any concerns.   After history, exam, and medical workup I feel the patient has been appropriately medically screened and is safe for discharge home. Pertinent diagnoses were discussed with the patient. Patient was given return precautions.   New Prescriptions New Prescriptions   ALBUTEROL (PROVENTIL HFA;VENTOLIN HFA) 108 (90 BASE) MCG/ACT INHALER    Inhale 1-2 puffs into the lungs every 6 (six) hours as needed for wheezing or shortness of breath.   IPRATROPIUM (ATROVENT) 0.06 % NASAL SPRAY    Place 2 sprays into both nostrils 4 (four) times daily.   PREDNISONE (DELTASONE) 20 MG TABLET    3 tabs po day one, then 2 po daily x 4 days     Adalene Gulotta, MD 03/11/17 6045

## 2017-03-11 NOTE — ED Triage Notes (Signed)
c/o congestion, cough  X 4 months  Head congestion

## 2017-09-18 ENCOUNTER — Emergency Department (HOSPITAL_BASED_OUTPATIENT_CLINIC_OR_DEPARTMENT_OTHER)
Admission: EM | Admit: 2017-09-18 | Discharge: 2017-09-18 | Disposition: A | Discharge status: Home / Self Care | Payer: No Typology Code available for payment source | Attending: Emergency Medicine | Admitting: Emergency Medicine

## 2017-09-18 ENCOUNTER — Other Ambulatory Visit: Payer: Self-pay

## 2017-09-18 ENCOUNTER — Encounter (HOSPITAL_BASED_OUTPATIENT_CLINIC_OR_DEPARTMENT_OTHER): Payer: Self-pay | Admitting: *Deleted

## 2017-09-18 DIAGNOSIS — Z87891 Personal history of nicotine dependence: Secondary | ICD-10-CM | POA: Insufficient documentation

## 2017-09-18 DIAGNOSIS — Z79899 Other long term (current) drug therapy: Secondary | ICD-10-CM | POA: Insufficient documentation

## 2017-09-18 DIAGNOSIS — J069 Acute upper respiratory infection, unspecified: Secondary | ICD-10-CM | POA: Insufficient documentation

## 2017-09-18 MED ORDER — ALBUTEROL SULFATE HFA 108 (90 BASE) MCG/ACT IN AERS: 1.0000 | INHALATION_SPRAY | Freq: Four times a day (QID) | RESPIRATORY_TRACT | 0 refills | Status: DC | PRN

## 2017-09-18 NOTE — ED Notes (Signed)
Correction on pt's symptoms. Pt states they started x 1 week ago. Pt reports some ShOB. Pt used an old albuterol MDI and had some relief. Pt reports productive cough.

## 2017-09-18 NOTE — ED Provider Notes (Signed)
[facility] EMERGENCY DEPARTMENT Provider Note   CSN: [id number] Arrival date & time: 09/18/17  1312     History   Chief Complaint Chief Complaint  Patient presents with  . URI    HPI Allen GarbeJohn Sawyer is a 56 y.o. male.  HPI 56 year old man presents today complaining of cough and congestion.  He states approximately 1 week ago he began having some nasal congestion, followed by sore throat, followed by some coughing.  He has had some bloating and decreased appetite but no fever, vomiting, or diarrhea.  He states he had some night sweats at the first couple of nights.  He has an albuterol inhaler and used it after feeling somewhat dyspneic.  He had good relief.  He is a former smoker.  He has not had a productive cough.  He denies any significant past medical history.  He states he felt somewhat bloated and constipated yesterday and took mag citrate.  He suspects he may be somewhat dehydrated secondary to this. History reviewed. No pertinent past medical history.  Patient Active Problem List   Diagnosis Date Noted  . Back pain 08/22/2011    Past Surgical History:  Procedure Laterality Date  . APPENDECTOMY         Home Medications    Prior to Admission medications   Medication Sig Start Date End Date Taking? Authorizing Provider  ibuprofen (ADVIL,MOTRIN) 400 MG tablet Take 400 mg every 6 (six) hours as needed by mouth.   Yes [provider]  albuterol (PROVENTIL HFA;VENTOLIN HFA) 108 (90 Base) MCG/ACT inhaler Inhale 1-2 puffs every 6 (six) hours as needed into the lungs for wheezing or shortness of breath. 09/18/17   Margarita Grizzleay, Saide Lanuza, MD  loratadine (CLARITIN) 10 MG tablet Take 10 mg by mouth daily.    [provider]    Family History Family History  Problem Relation Age of Onset  . Hyperlipidemia Mother   . Hypertension Mother   . Hyperlipidemia Father   . Hypertension Father   . Heart attack Father   . Hyperlipidemia Sister   . Hypertension  Sister   . Hypertension Brother   . Hyperlipidemia Brother   . Diabetes Brother   . Heart attack Paternal Uncle   . Sudden death Neg Hx     Social History Social History   Tobacco Use  . Smoking status: Former Games developermoker  . Smokeless tobacco: Never Used  Substance Use Topics  . Alcohol use: Yes    Comment: occ  . Drug use: No     Allergies   Patient has no known allergies.   Review of Systems Review of Systems  All other systems reviewed and are negative.    Physical Exam Updated Vital Signs BP (!) 158/86   Pulse 81   Temp 98.5 F (36.9 C) (Oral)   Resp 16   Ht 1.88 m (6\' 2" )   Wt 108.9 kg (240 lb)   SpO2 100%   BMI 30.81 kg/m   Physical Exam  Constitutional: He is oriented to person, place, and time. He appears well-developed.  HENT:  Head: Normocephalic and atraumatic.  Right Ear: External ear normal.  Left Ear: External ear normal.  Nose: Nose normal.  Mouth/Throat: Oropharynx is clear and moist.  Eyes: Conjunctivae and EOM are normal. Pupils are equal, round, and reactive to light.  Neck: Normal range of motion. Neck supple. No tracheal deviation present.  Cardiovascular: Normal rate, regular rhythm and normal heart sounds.  Pulmonary/Chest: Effort normal and breath  sounds normal. No respiratory distress. He has no wheezes. He has no rales.  Musculoskeletal: Normal range of motion.  Neurological: He is alert and oriented to person, place, and time.  Skin: Skin is warm and dry.  Psychiatric: He has a normal mood and affect. His behavior is normal.  Nursing note and vitals reviewed.    ED Treatments / Results  Labs (all labs ordered are listed, but only abnormal results are displayed) Labs Reviewed - No data to display  EKG  EKG Interpretation None       Radiology No results found.  Procedures Procedures (including critical care time)  Medications Ordered in ED Medications - No data to display   Initial Impression / Assessment and  Plan / ED Course  I have reviewed the triage vital signs and the nursing notes.  Pertinent labs & imaging results that were available during my care of the patient were reviewed by me and considered in my medical decision making (see chart for details).      Final Clinical Impressions(s) / ED Diagnoses   Final diagnoses:  Upper respiratory tract infection, unspecified type    ED Discharge Orders        Ordered    albuterol (PROVENTIL HFA;VENTOLIN HFA) 108 (90 Base) MCG/ACT inhaler  Every 6 hours PRN     09/18/17 1357       Margarita Grizzleay, Lamonta Cypress, MD 09/18/17 1359

## 2017-09-18 NOTE — ED Triage Notes (Signed)
Pt c/o URi symptoms x 1 month

## 2017-09-18 NOTE — Discharge Instructions (Addendum)
Recheck of your blood pressure with your doctor next week.

## 2019-09-03 ENCOUNTER — Ambulatory Visit (INDEPENDENT_AMBULATORY_CARE_PROVIDER_SITE_OTHER): Payer: Managed Care, Other (non HMO) | Admitting: Family Medicine

## 2019-09-03 ENCOUNTER — Encounter: Payer: Self-pay | Admitting: Family Medicine

## 2019-09-03 ENCOUNTER — Other Ambulatory Visit: Payer: Self-pay

## 2019-09-03 VITALS — BP 138/86 | HR 88 | Temp 98.5°F | Ht 74.0 in | Wt 224.4 lb

## 2019-09-03 DIAGNOSIS — Z1159 Encounter for screening for other viral diseases: Secondary | ICD-10-CM

## 2019-09-03 DIAGNOSIS — S46811A Strain of other muscles, fascia and tendons at shoulder and upper arm level, right arm, initial encounter: Secondary | ICD-10-CM

## 2019-09-03 DIAGNOSIS — L989 Disorder of the skin and subcutaneous tissue, unspecified: Secondary | ICD-10-CM

## 2019-09-03 DIAGNOSIS — Z125 Encounter for screening for malignant neoplasm of prostate: Secondary | ICD-10-CM

## 2019-09-03 DIAGNOSIS — M79652 Pain in left thigh: Secondary | ICD-10-CM

## 2019-09-03 DIAGNOSIS — Z114 Encounter for screening for human immunodeficiency virus [HIV]: Secondary | ICD-10-CM

## 2019-09-03 DIAGNOSIS — Z1211 Encounter for screening for malignant neoplasm of colon: Secondary | ICD-10-CM

## 2019-09-03 DIAGNOSIS — Z0000 Encounter for general adult medical examination without abnormal findings: Secondary | ICD-10-CM

## 2019-09-03 NOTE — Patient Instructions (Addendum)
Give [place] 2-3 business days to get the results of your labs back.   Keep the diet clean and stay active.  The new Shingrix vaccine (for shingles) is a 2 shot series. It can make people feel low energy, achy and almost like they have the flu for 48 hours after injection. Please plan accordingly when deciding on when to get this shot. Call our office for a nurse visit appointment to get this. The second shot of the series is less severe regarding the side effects, but it still lasts 48 hours.   If you do not hear anything about your referral in the next 1-2 weeks, call our office and ask for an update.  Put Vaseline at least twice daily on your leg. This should get better with time.   Let us know if you need anything.  Quadriceps Strain Rehab It is normal to feel mild stretching, pulling, tightness, or discomfort as you do these exercises, but you should stop right away if you feel sudden pain or your pain gets worse. Stretching and range of motion exercises These exercises warm up your muscles and joints and improve the movement and flexibility of your thigh. These exercises can also help to relieve stiffness or swelling. Exercise A: Heel slides   1. Lie on your back with both knees straight. If this causes back discomfort, bend the knee of your healthy leg, placing your foot flat on the floor. 2. Slowly slide your left / right heel back toward your buttocks until you feel a gentle stretch in the front of your knee or thigh. 3. Hold for 30 seconds. Then slowly slide your heel back to the starting position. Repeat 2 times. Complete this exercise 3 times a week. Exercise B: Quadriceps stretch, prone   1. Lie on your abdomen on a firm surface, such as a bed or padded floor. 2. Bend your left / right knee and hold your ankle. If you cannot reach your ankle or pant leg, loop a belt around your foot and grab the belt instead. 3. Gently pull your heel toward your buttocks. Your knee should not  slide out to the side. You should feel a stretch in the front of your thigh and knee. 4. Hold this position for 30 seconds. Repeat 2 times. Complete this exercise 3 times a week. Strengthening exercises These exercises build strength and endurance in your thigh. Endurance is the ability to use your muscles for a long time, even after your muscles get tired. Exercise C: Straight leg raises (quadriceps and hip flexors) Quality counts! Watch for signs that the quadriceps muscle is working to ensure that you are strengthening the correct muscles and not cheating by using healthier muscles. 1. Lie on your back with your left / right leg extended and your other knee bent. 2. Tense the muscles in the front of your left / right thigh. You should see your kneecap slide up or see increased dimpling just above the knee. 3. Tighten these muscles even more and raise your leg 4-6 inches (10-15 cm) off the floor. 4. Hold for 3 seconds. 5. Keep the thigh muscles tense as you lower your leg. 6. Relax the muscles slowly and completely after each repetition. Repeat 2 times. Complete this exercise 3 times a week. Exercise D: Straight leg raises (hip extensors) 1. Lie on your belly on a bed or a firm surface with a pillow under your hips. 2. Bend your left / right knee so your foot is straight  up in the air. 3. Tense your buttock muscles and lift your left / right thigh off the bed. Do not let your back arch. 4. Hold this position for 3 seconds. 5. Slowly return to the starting position. Let your muscles relax completely before doing another repetition. Repeat 2 times. Complete this exercise 3 times a week. Exercise E: Wall sits   Follow the directions for form closely. If you do not place your feet and knees properly, this can lead to knee pain. 1. Lean back against a smooth wall or door and walk your feet out 18-24 inches (46-61 cm) from it. Place your feet hip-width apart. 2. Slowly slide down the wall or  door until your knees bend  60-90 degrees. Keep your weight back and over your heels, not over your toes. Keep your thighs straight or pointing slightly outward. 3. Hold for 1 second. 4. Use your thigh and buttock muscles to push you back up to a standing position. Keep your weight through your heels while you do this. 5. Rest for 5 seconds in between repetitions. Repeat 2 times. Complete this exercise 3 times a week. Make sure you discuss any questions you have with your health care provider. Document Released: 10/17/2005 Document Revised: 06/23/2016 Document Reviewed: 07/21/2015 Elsevier Interactive Patient Education  [employer].

## 2019-09-03 NOTE — Progress Notes (Signed)
Chief Complaint  Patient presents with  . New Patient (Initial Visit)    needs physical/spider bite    Well Male Allen Sawyer is here for a complete physical.   His last physical was >1 year ago.  Current diet: in general, a "healthy" diet.  Current exercise: active at work, walking, goes to [employer] Weight trend: stable Daytime fatigue? No. Seat belt? Yes.    Health maintenance Shingrix- No Colonoscopy- No Tetanus- Yes HIV- No Hep C- No   2 weeks ago, he thinks he got bitten by a spider on his right lower extremity.  Is currently scaly and somewhat itchy.  He is not putting anything on it.  There is no pain, drainage, spreading redness, or fevers.  The patient has a history of intermittent right shoulder pain.  No current issues now.  Feels more like a cramp.  He wants to make sure does not related to his heart.  The patient has a history of left distal thigh pain.  No injury or change in activity.  He is not lift weights currently as he is not going to the gym consistently.  There is no injury or change in activity.  He has not tried anything for this.  Past Medical History:  Diagnosis Date  . No known health problems       Past Surgical History:  Procedure Laterality Date  . APPENDECTOMY      Medications  Takes no meds routinely.   Allergies No Known Allergies  Family History Family History  Problem Relation Age of Onset  . Hyperlipidemia Mother   . Hypertension Mother   . Hyperlipidemia Father   . Hypertension Father   . Heart attack Father   . Hyperlipidemia Sister   . Hypertension Sister   . Hypertension Brother   . Hyperlipidemia Brother   . Diabetes Brother   . Heart attack Paternal Uncle   . Sudden death Neg Hx     Review of Systems: Constitutional:  no fevers Eye:  no recent significant change in vision Ear/Nose/Mouth/Throat:  Ears:  no hearing loss Nose/Mouth/Throat:  no complaints of nasal congestion, no sore  throat Cardiovascular:  no chest pain, no palpitations Respiratory:  no cough and no shortness of breath Gastrointestinal:  no abdominal pain, no change in bowel habits GU:  Male: negative for dysuria, frequency, and incontinence and negative for prostate symptoms Musculoskeletal/Extremities: +intermittent L thigh pain; otherwise no pain, redness, or swelling of the joints Integumentary (Skin/Breast): + Lesion on right lower extremity; otherwise no abnormal skin lesions reported Neurologic:  no headaches Endocrine: No unexpected weight changes Hematologic/Lymphatic:  no abnormal bleeding  Exam BP 138/86 (BP Location: Left Arm, Patient Position: Sitting, Cuff Size: Normal)   Pulse 88   Temp 98.5 F (36.9 C) (Temporal)   Ht 6\' 2"  (1.88 m)   Wt 224 lb 6 oz (101.8 kg)   SpO2 98%   BMI 28.81 kg/m  General:  well developed, well nourished, in no apparent distress Skin: Patch of hyperkeratinization and scaling over the anterior portion of the right lower extremity midway down the tibia, no pain, erythema, drainage, or fluctuance; no significant moles, warts, or growths Head:  no masses, lesions, or tenderness Eyes:  pupils equal and round, sclera anicteric without injection Ears:  canals without lesions, TMs shiny without retraction, no obvious effusion, no erythema Nose:  nares patent, septum midline, mucosa normal Throat/Pharynx:  lips and gingiva without lesion; tongue and uvula midline; non-inflamed pharynx; no exudates  or postnasal drainage Neck: neck supple without adenopathy, thyromegaly, or masses Lungs:  clear to auscultation, breath sounds equal bilaterally, no respiratory distress Rectal: Deferred Musculoskeletal: + Tenderness over the distal L quad; no ttp over R trap but he does not that is where the pain is when he gets it; otherwise symmetrical muscle groups noted without atrophy or deformity Extremities:  no clubbing, cyanosis, or edema, no deformities, no skin  discoloration Neuro:  gait normal; deep tendon reflexes normal and symmetric Psych: well oriented with normal range of affect and appropriate judgment/insight  Assessment and Plan  Well adult exam - Plan: CBC, Comprehensive metabolic panel, Lipid panel  Screen for colon cancer - Plan: Ambulatory referral to Gastroenterology  Screening for prostate cancer - Plan: PSA  Screening for HIV (human immunodeficiency virus) - Plan: HIV Antibody (routine testing w rflx)  Encounter for hepatitis C screening test for low risk patient - Plan: Hepatitis C antibody  Skin lesion  Pain of left thigh  Strain of right trapezius muscle, initial encounter   Well 58 y.o. male. Counseled on diet and exercise. Counseled on risks and benefits of prostate cancer screening with PSA. The patient agrees to undergo testing. For the area of hyperkeratinization, use Vaseline at least twice daily over the next several weeks.  I think this will self resolve.  If it does not, he will return to the clinic. For the thigh, will provide him with stretches and exercises for the quadricep.  If no improvement, will consider physical therapy. I believe he has a history of a trapezius muscle strain.  We will hold off on any further management as he is not having issues. Immunizations, labs, and further orders as above.  Information about the shingles vaccine verbalized and written down. Follow up in 1 yr or prn. The patient voiced understanding and agreement to the plan.  [place], DO 09/03/19 2:13 PM

## 2019-09-12 NOTE — Addendum Note (Signed)
Addended by: Caffie Pinto on: 09/12/2019 09:22 AM   Modules accepted: Orders

## 2019-09-13 ENCOUNTER — Other Ambulatory Visit (INDEPENDENT_AMBULATORY_CARE_PROVIDER_SITE_OTHER): Payer: Managed Care, Other (non HMO)

## 2019-09-13 ENCOUNTER — Other Ambulatory Visit: Payer: Self-pay

## 2019-09-13 DIAGNOSIS — Z125 Encounter for screening for malignant neoplasm of prostate: Secondary | ICD-10-CM | POA: Diagnosis not present

## 2019-09-13 DIAGNOSIS — Z114 Encounter for screening for human immunodeficiency virus [HIV]: Secondary | ICD-10-CM

## 2019-09-13 DIAGNOSIS — Z0000 Encounter for general adult medical examination without abnormal findings: Secondary | ICD-10-CM

## 2019-09-13 DIAGNOSIS — Z1159 Encounter for screening for other viral diseases: Secondary | ICD-10-CM

## 2019-09-13 LAB — PSA: PSA: 0.5 ng/mL (ref 0.10–4.00)

## 2019-09-13 LAB — LIPID PANEL
Cholesterol: 146 mg/dL (ref 0–200)
HDL: 55.9 mg/dL (ref >39.00)
LDL Cholesterol: 71 mg/dL (ref 0–99)
NonHDL: 90.06
Total CHOL/HDL Ratio: 3
Triglycerides: 96 mg/dL (ref 0.0–149.0)
VLDL: 19.2 mg/dL (ref 0.0–40.0)

## 2019-09-13 LAB — COMPREHENSIVE METABOLIC PANEL
ALT: 16 U/L (ref 0–53)
AST: 18 U/L (ref 0–37)
Albumin: 4.1 g/dL (ref 3.5–5.2)
Alkaline Phosphatase: 70 U/L (ref 39–117)
BUN: 12 mg/dL (ref 6–23)
CO2: 29 mEq/L (ref 19–32)
Calcium: 9.1 mg/dL (ref 8.4–10.5)
Chloride: 105 mEq/L (ref 96–112)
Creatinine, Ser: 0.97 mg/dL (ref 0.40–1.50)
GFR: 96.75 mL/min (ref >60.00)
Glucose, Bld: 90 mg/dL (ref 70–99)
Potassium: 4.5 mEq/L (ref 3.5–5.1)
Sodium: 141 mEq/L (ref 135–145)
Total Bilirubin: 0.4 mg/dL (ref 0.2–1.2)
Total Protein: 7.7 g/dL (ref 6.0–8.3)

## 2019-09-13 LAB — CBC
HCT: 37.1 % — ABNORMAL LOW (ref 39.0–52.0)
Hemoglobin: 11.5 g/dL — ABNORMAL LOW (ref 13.0–17.0)
MCHC: 31.2 g/dL (ref 30.0–36.0)
MCV: 78.3 fl (ref 78.0–100.0)
Platelets: 199 10*3/uL (ref 150.0–400.0)
RBC: 4.73 Mil/uL (ref 4.22–5.81)
RDW: 22.4 % — ABNORMAL HIGH (ref 11.5–15.5)
WBC: 4.2 10*3/uL (ref 4.0–10.5)

## 2019-09-16 LAB — HEPATITIS C ANTIBODY
Hepatitis C Ab: NONREACTIVE
SIGNAL TO CUT-OFF: 0.01 (ref <1.00)

## 2019-09-16 LAB — HIV ANTIBODY (ROUTINE TESTING W REFLEX): HIV 1&2 Ab, 4th Generation: NONREACTIVE

## 2019-09-17 ENCOUNTER — Other Ambulatory Visit: Payer: Managed Care, Other (non HMO)

## 2019-09-17 DIAGNOSIS — R031 Nonspecific low blood-pressure reading: Secondary | ICD-10-CM

## 2019-09-18 LAB — IRON, TOTAL/TOTAL IRON BINDING CAP
%SAT: 10 % (calc) — ABNORMAL LOW (ref 20–48)
Iron: 41 ug/dL — ABNORMAL LOW (ref 50–180)
TIBC: 420 mcg/dL (calc) (ref 250–425)

## 2019-10-10 ENCOUNTER — Encounter: Payer: Self-pay | Admitting: Family Medicine

## 2019-10-15 ENCOUNTER — Encounter: Payer: Self-pay | Admitting: Family Medicine

## 2020-01-10 ENCOUNTER — Encounter: Payer: Self-pay | Admitting: Family Medicine

## 2020-01-10 ENCOUNTER — Ambulatory Visit: Payer: Managed Care, Other (non HMO) | Attending: Internal Medicine

## 2020-01-10 ENCOUNTER — Ambulatory Visit (INDEPENDENT_AMBULATORY_CARE_PROVIDER_SITE_OTHER): Payer: Managed Care, Other (non HMO) | Admitting: Family Medicine

## 2020-01-10 ENCOUNTER — Other Ambulatory Visit: Payer: Self-pay

## 2020-01-10 VITALS — BP 132/84 | HR 71 | Temp 96.9°F | Ht 74.0 in | Wt 230.0 lb

## 2020-01-10 DIAGNOSIS — R197 Diarrhea, unspecified: Secondary | ICD-10-CM | POA: Diagnosis not present

## 2020-01-10 DIAGNOSIS — J069 Acute upper respiratory infection, unspecified: Secondary | ICD-10-CM

## 2020-01-10 DIAGNOSIS — Z20822 Contact with and (suspected) exposure to covid-19: Secondary | ICD-10-CM

## 2020-01-10 NOTE — Progress Notes (Signed)
Chief Complaint  Patient presents with  . Follow-up    Chills, diarrhea and eye drainage over the weekend    Allen Sawyer here for URI complaints.  Duration: 5 days  Associated symptoms: subjective fever, sinus congestion, rhinorrhea and cough, diarrhea, abd cramping Denies: sinus pain, itchy watery eyes, ear pain, ear drainage, sore throat, wheezing, shortness of breath, myalgia and N/V Treatment to date: none Sick contacts: None that he knows of, though he works at [employer] better overall, wondering when he is safe to return to work  ROS:  Const: Denies current fevers HEENT: As noted in HPI Lungs: No SOB  Past Medical History:  Diagnosis Date  . No known health problems     BP 132/84 (BP Location: Left Arm, Patient Position: Sitting, Cuff Size: Normal)   Pulse 71   Temp (!) 96.9 F (36.1 C) (Temporal)   Ht 6\' 2"  (1.88 m)   Wt 230 lb (104.3 kg)   SpO2 98%   BMI 29.53 kg/m  General: Awake, alert, appears stated age HEENT: AT, Jay, ears patent b/l and TM's neg, nares patent w/o discharge, pharynx pink and without exudates, MMM Neck: No masses or asymmetry Heart: RRR Lungs: CTAB, no accessory muscle use Abd: BS+, S, NT, ND Psych: Age appropriate judgment and insight, normal mood and affect  Diarrhea, unspecified type  Upper respiratory tract infection, unspecified type  Rec covid testing. Info provided in AVS.  Sounds like most of s/s's resolved. If test neg, can return to work. Otherwise would need to wait 10 full days if positive.  Letter for work excusing through 3/17 provided.  F/u prn. If starting to experience irreplaceale fluid loss, shaking, or shortness of breath, seek immediate care. Pt voiced understanding and agreement to the plan.  4/17 [place], DO 01/10/20 7:38 AM

## 2020-01-10 NOTE — Patient Instructions (Addendum)
You can go to [email or web address] for testing or text [phone] or go to [facility] website and make an appt. Urgent care is another option.  You can go back Wednesday otherwise.  Continue to push fluids, practice good hand hygiene, and cover your mouth if you cough.  If you start having fevers, shaking or shortness of breath, seek immediate care.  Let us know if you need anything.

## 2020-01-11 LAB — NOVEL CORONAVIRUS, NAA: SARS-CoV-2, NAA: NOT DETECTED

## 2020-01-15 ENCOUNTER — Encounter: Payer: Self-pay | Admitting: Family Medicine

## 2020-02-02 ENCOUNTER — Other Ambulatory Visit: Payer: Self-pay

## 2020-02-02 ENCOUNTER — Encounter (HOSPITAL_BASED_OUTPATIENT_CLINIC_OR_DEPARTMENT_OTHER): Payer: Self-pay | Admitting: Emergency Medicine

## 2020-02-02 ENCOUNTER — Emergency Department (HOSPITAL_BASED_OUTPATIENT_CLINIC_OR_DEPARTMENT_OTHER)
Admission: EM | Admit: 2020-02-02 | Discharge: 2020-02-02 | Disposition: A | Discharge status: Home / Self Care | Payer: No Typology Code available for payment source | Attending: Emergency Medicine | Admitting: Emergency Medicine

## 2020-02-02 DIAGNOSIS — S3992XA Unspecified injury of lower back, initial encounter: Secondary | ICD-10-CM | POA: Diagnosis present

## 2020-02-02 DIAGNOSIS — S29019A Strain of muscle and tendon of unspecified wall of thorax, initial encounter: Secondary | ICD-10-CM

## 2020-02-02 DIAGNOSIS — X501XXA Overexertion from prolonged static or awkward postures, initial encounter: Secondary | ICD-10-CM | POA: Diagnosis not present

## 2020-02-02 DIAGNOSIS — S29012A Strain of muscle and tendon of back wall of thorax, initial encounter: Secondary | ICD-10-CM | POA: Insufficient documentation

## 2020-02-02 DIAGNOSIS — Y929 Unspecified place or not applicable: Secondary | ICD-10-CM | POA: Diagnosis not present

## 2020-02-02 DIAGNOSIS — Y939 Activity, unspecified: Secondary | ICD-10-CM | POA: Diagnosis not present

## 2020-02-02 DIAGNOSIS — R030 Elevated blood-pressure reading, without diagnosis of hypertension: Secondary | ICD-10-CM

## 2020-02-02 DIAGNOSIS — Y990 Civilian activity done for income or pay: Secondary | ICD-10-CM | POA: Insufficient documentation

## 2020-02-02 DIAGNOSIS — S39012A Strain of muscle, fascia and tendon of lower back, initial encounter: Secondary | ICD-10-CM | POA: Diagnosis not present

## 2020-02-02 DIAGNOSIS — Z87891 Personal history of nicotine dependence: Secondary | ICD-10-CM | POA: Diagnosis not present

## 2020-02-02 MED ORDER — METHOCARBAMOL 500 MG PO TABS: 500.0000 mg | ORAL_TABLET | Freq: Two times a day (BID) | ORAL | 0 refills | Status: DC

## 2020-02-02 MED ORDER — IBUPROFEN 800 MG PO TABS
800.0000 mg | ORAL_TABLET | Freq: Once | ORAL | Status: AC
  Administered 2020-02-02: 800 mg via ORAL
  Filled 2020-02-02: qty 1

## 2020-02-02 MED ORDER — NAPROXEN 500 MG PO TABS: 500.0000 mg | ORAL_TABLET | Freq: Two times a day (BID) | ORAL | 0 refills | Status: DC

## 2020-02-02 NOTE — Discharge Instructions (Addendum)
As discussed, your back pain is likely due to a strained muscle.  I am sending you home with 2 medications.  Naproxen is a pain medication.  You may take it twice a day as needed for pain.  Also sending you home with a muscle relaxer called Robaxin.  You may also take it twice a day as needed for pain.  Robaxin can cause drowsiness and do not drive or operate machinery while on the medication.  You may also purchase over-the-counter Voltaren gel and Lidoderm patches as needed for added pain relief.  If symptoms do not improve over the next few weeks, please follow-up with PCP for further evaluation.  Return to the ER for new or worsening symptoms.  Please have your blood pressure recheck by your PCP within the next week since it was elevated today.

## 2020-02-02 NOTE — ED Provider Notes (Signed)
[facility] EMERGENCY DEPARTMENT Provider Note   CSN: [id number] Arrival date & time: 02/02/20  1117     History Chief Complaint  Patient presents with  . Back Pain    Allen Sawyer is a 59 y.o. male with no significant past medical history who presents to the ED for evaluation of mid to low back pain that started earlier this morning.  Patient states he was at work and twisted his back  and felt a "pop".  Patient denies falling on his back.  Denies saddle paresthesias, bowel/bladder incontinence, lower extremity numbness/tingling, lower extremity weakness, IV drug use, fever/chills, and history of cancer.  Patient notes pain is nonradiating and worse with movement.  He describes the pain as a "muscle spasm".  He has tried Tylenol with moderate relief.  No history of chronic low back pain.  Denies abdominal pain and urinary symptoms.  History obtained from patient and past medical records. No interpreter used during encounter.      Past Medical History:  Diagnosis Date  . No known health problems     Patient Active Problem List   Diagnosis Date Noted  . Back pain 08/22/2011    Past Surgical History:  Procedure Laterality Date  . APPENDECTOMY         Family History  Problem Relation Age of Onset  . Hyperlipidemia Mother   . Hypertension Mother   . Hyperlipidemia Father   . Hypertension Father   . Heart attack Father   . Hyperlipidemia Sister   . Hypertension Sister   . Hypertension Brother   . Hyperlipidemia Brother   . Diabetes Brother   . Heart attack Paternal Uncle   . Sudden death Neg Hx     Social History   Tobacco Use  . Smoking status: Former Games developer  . Smokeless tobacco: Never Used  Substance Use Topics  . Alcohol use: Yes    Comment: occ  . Drug use: No    Home Medications Prior to Admission medications   Medication Sig Start Date End Date Taking? Authorizing Provider  methocarbamol (ROBAXIN) 500 MG tablet Take 1 tablet (500 mg total)  by mouth 2 (two) times daily. 02/02/20   Mannie Stabile, PA-C  naproxen (NAPROSYN) 500 MG tablet Take 1 tablet (500 mg total) by mouth 2 (two) times daily. 02/02/20   Mannie Stabile, PA-C    Allergies    Patient has no known allergies.  Review of Systems   Review of Systems  Constitutional: Negative for chills and fever.  Gastrointestinal: Negative for abdominal pain.  Genitourinary: Negative for dysuria.  Musculoskeletal: Positive for back pain and gait problem.  Neurological: Negative for numbness.    Physical Exam Updated Vital Signs BP (!) 153/97 (BP Location: Right Arm)   Pulse 72   Temp (!) 97.5 F (36.4 C) (Oral)   Resp 18   Ht 6\' 2"  (1.88 m)   Wt 106.6 kg   SpO2 100%   BMI 30.17 kg/m   Physical Exam Vitals and nursing note reviewed.  Constitutional:      General: He is not in acute distress.    Appearance: He is not ill-appearing.  HENT:     Head: Normocephalic.  Eyes:     Pupils: Pupils are equal, round, and reactive to light.  Cardiovascular:     Rate and Rhythm: Normal rate and regular rhythm.     Pulses: Normal pulses.     Heart sounds: Normal heart sounds. No murmur. No friction  rub. No gallop.   Pulmonary:     Effort: Pulmonary effort is normal.     Breath sounds: Normal breath sounds.  Abdominal:     General: Abdomen is flat. Bowel sounds are normal. There is no distension.     Palpations: Abdomen is soft.     Tenderness: There is no abdominal tenderness. There is no guarding or rebound.  Musculoskeletal:     Cervical back: Neck supple.     Comments: No T-spine and L-spine midline tenderness, no stepoff or deformity, reproducible thoracic/lumbar paraspinal tenderness No leg edema bilaterally Patient moves all extremities without difficulty. DP/PT pulses 2+ and equal bilaterally Sensation grossly intact bilaterally Strength of knee flexion and extension is 5/5 Plantar and dorsiflexion of ankle 5/5 Able to ambulate without difficulty     Skin:    General: Skin is warm and dry.  Neurological:     General: No focal deficit present.     Mental Status: He is alert.  Psychiatric:        Mood and Affect: Mood normal.        Behavior: Behavior normal.     ED Results / Procedures / Treatments   Labs (all labs ordered are listed, but only abnormal results are displayed) Labs Reviewed - No data to display  EKG None  Radiology No results found.  Procedures Procedures (including critical care time)  Medications Ordered in ED Medications  ibuprofen (ADVIL) tablet 800 mg (800 mg Oral Given 02/02/20 1239)    ED Course  I have reviewed the triage vital signs and the nursing notes.  Pertinent labs & imaging results that were available during my care of the patient were reviewed by me and considered in my medical decision making (see chart for details).    MDM Rules/Calculators/A&P                     59 year old male presents to the ED for an evaluation of back pain that started earlier this morning at work when he twisted his back.  No history of chronic back pain.  Denies falling directly on his back.  Stable vitals.  Patient has mildly elevated BP without any symptoms of hypertensive emergency/emergency.  Elevated BP likely due to pain.  Blood pressure has already improved while in the ED.  Patient no acute distress and non-ill-appearing.  Reproducible bilateral thoracic and lumbar paraspinal tenderness.  No midline tenderness. History without red flags (cancer, IVDU, weakness, saddle anesthesia, weight loss) and physical exam most consistent with muscular strain. Doubt cauda equina or disc herniation due to lack of saddle anesthesia/bowel or bladder incontinence or urinary retention, normal gait and reassuring physical examination without neurologic deficits.   Broad differential for back pain considered includes malignancy, disc herniation, spinal epidural abscess, spinal fracture, cauda equina, pyelonephritis, kidney  stone, AAA, AD, pancreatitis, PE and PTX. History is not supportive of kidney stone, AAA, AD, pancreatitis, PE or PTX. Patient has no CVA tenderness or urinary symptoms to suggest pyelonephritis or kidney stone.   Will manage patient conservatively at this time. NSAIDs, back exercises/stretches, heat therapy and follow up with PCP if symptoms do not resolve in 3-4 weeks. Patient offered muscle relaxer for comfort at night. Counseled on need to return to ED for fever, worsening or concerning symptoms. Strict ED precautions discussed with patient. Patient states understanding and agrees to plan. Patient discharged home in no acute distress and stable vitals  Final Clinical Impression(s) / ED Diagnoses Final diagnoses:  Strain of lumbar region, initial encounter  Thoracic myofascial strain, initial encounter  Elevated blood pressure reading    Rx / DC Orders ED Discharge Orders         Ordered    naproxen (NAPROSYN) 500 MG tablet  2 times daily     02/02/20 1226    methocarbamol (ROBAXIN) 500 MG tablet  2 times daily     02/02/20 [address] [place] 02/02/20 1248    Rolan Bucco, MD 02/02/20 1254

## 2020-02-02 NOTE — ED Triage Notes (Signed)
Patient states that he fell at work and has mid back pain since then. He took tylenol about an hour ago for the pain

## 2020-02-05 ENCOUNTER — Encounter: Payer: Self-pay | Admitting: Family Medicine

## 2020-02-05 ENCOUNTER — Ambulatory Visit (INDEPENDENT_AMBULATORY_CARE_PROVIDER_SITE_OTHER): Payer: Managed Care, Other (non HMO) | Admitting: Family Medicine

## 2020-02-05 ENCOUNTER — Other Ambulatory Visit: Payer: Self-pay

## 2020-02-05 VITALS — BP 148/84 | HR 92 | Temp 97.3°F | Ht 74.0 in | Wt 230.0 lb

## 2020-02-05 DIAGNOSIS — R030 Elevated blood-pressure reading, without diagnosis of hypertension: Secondary | ICD-10-CM | POA: Diagnosis not present

## 2020-02-05 DIAGNOSIS — M545 Low back pain, unspecified: Secondary | ICD-10-CM

## 2020-02-05 NOTE — Progress Notes (Signed)
Musculoskeletal Exam  Patient: Allen Sawyer DOB: 1961/04/04  DOS: 02/05/2020  SUBJECTIVE:  Chief Complaint:   Chief Complaint  Patient presents with  . Back Injury    feel on ice at work  . Back Pain    Allen Sawyer is a 59 y.o.  male for evaluation and treatment of his back pain.   Onset: 4 days ago; was falling off of a forklift and wrenched his lower back feeling immediate pain Location: lower Character:  aching and sharp  Progression of issue:  has improved Associated symptoms: Will have random spasms when he is walking; denies bruising, redness, or swelling Denies bowel/bladder incontinence or weakness Treatment: to date has been ice, prescription NSAIDS and muscle relaxers.   Neurovascular symptoms: no Reports he has not been sleeping due to the pain.  Past Medical History:  Diagnosis Date  . No known health problems     Objective:  VITAL SIGNS: BP (!) 148/84 (BP Location: Left Arm, Patient Position: Sitting, Cuff Size: Large)   Pulse 92   Temp (!) 97.3 F (36.3 C) (Temporal)   Ht 6\' 2"  (1.88 m)   Wt 230 lb (104.3 kg)   SpO2 98%   BMI 29.53 kg/m  Constitutional: Well formed, well developed. No acute distress. HENT: Normocephalic, atraumatic.  Thorax & Lungs:  No accessory muscle use Skin: Warm. Dry. No erythema. No rash.  Musculoskeletal: low back.   Tenderness to palpation: Yes over thoracolumbar midline Deformity: no Ecchymosis: no Straight leg test: negative for Poor hamstring flexibility b/l. Neurologic: Normal sensory function. No focal deficits noted. DTR's equal and symmetric in LE's. No clonus. Psychiatric: Normal mood. Age appropriate judgment and insight. Alert & oriented x 3.    Assessment:  Acute midline low back pain without sciatica  Elevated blood pressure reading  Plan: Stretches/exercises, heat, ice, Tylenol, NSAIDs.  Letter for work given.  I believe he will continue to improve, stretches and exercises should expedite the  process. He has not slept and is currently in pain.  He will monitor his blood pressure moving forward and if it does not remain under 140/90, he will schedule a follow-up appointment. F/u prn otherwise. The patient voiced understanding and agreement to the plan.   [place], DO 02/05/20  2:25 PM

## 2020-02-05 NOTE — Patient Instructions (Addendum)

## 2020-03-27 ENCOUNTER — Encounter: Payer: Self-pay | Admitting: Family Medicine

## 2020-03-27 ENCOUNTER — Ambulatory Visit (INDEPENDENT_AMBULATORY_CARE_PROVIDER_SITE_OTHER): Payer: Managed Care, Other (non HMO) | Admitting: Family Medicine

## 2020-03-27 ENCOUNTER — Other Ambulatory Visit: Payer: Self-pay

## 2020-03-27 VITALS — BP 138/84 | HR 74 | Temp 96.8°F | Ht 74.0 in | Wt 216.0 lb

## 2020-03-27 DIAGNOSIS — R5383 Other fatigue: Secondary | ICD-10-CM | POA: Diagnosis not present

## 2020-03-27 DIAGNOSIS — R0683 Snoring: Secondary | ICD-10-CM | POA: Diagnosis not present

## 2020-03-27 NOTE — Patient Instructions (Addendum)
If you do not hear anything about your referral in the next 1-2 weeks, call our office and ask for an update.  Sleep Hygiene Tips:  Do not watch TV or look at screens within 1 hour of going to bed. If you do, make sure there is a blue light filter (nighttime mode) involved.  Try to go to bed around the same time every night. Wake up at the same time within 1 hour of regular time. Ex: If you wake up at 7 AM for work, do not sleep past 8 AM on days that you don't work.  Do not drink alcohol before bedtime.  Do not consume caffeine-containing beverages after noon or within 9 hours of intended bedtime.  Get regular exercise/physical activity in your life, but not within 2 hours of planned bedtime.  Do not take naps.   Do not eat within 2 hours of planned bedtime.  Melatonin, 3-5 mg 30-60 minutes before planned bedtime may be helpful.   The bed should be for sleep or sex only. If after 20-30 minutes you are unable to fall asleep, get up and do something relaxing. Do this until you feel ready to go to sleep again.   Very strong work with your weight loss and improvement in diet/exercise.  Let us know if you need anything.

## 2020-03-27 NOTE — Progress Notes (Signed)
Chief Complaint  Patient presents with  . Insomnia    Subjective: Patient is a 59 y.o. male here for sleep issues.  Has a longstanding history of snoring and poor sleep. Has gotten worse over past 2 weeks. +famhx of snoring and gasping for air. Was scheduled for sleep study, but never had it done. Very tired during day, particularly in afternoon. No hx of lung dz or smoking.   Past Medical History:  Diagnosis Date  . No known health problems    Objective: BP 138/84 (BP Location: Left Arm, Patient Position: Sitting, Cuff Size: Large)   Pulse 74   Temp (!) 96.8 F (36 C) (Temporal)   Ht 6\' 2"  (1.88 m)   Wt 216 lb (98 kg)   SpO2 99%   BMI 27.73 kg/m  General: Awake, appears stated age HEENT: MMM, EOMi, Mallampati 4 Heart: RRR Lungs: CTAB, no rales, wheezes or rhonchi. No accessory muscle use Psych: Age appropriate judgment and insight, normal affect and mood  Assessment and Plan: Snoring - Plan: Ambulatory referral to Pulmonology  Fatigue, unspecified type - Plan: Ambulatory referral to Pulmonology  Likely OSA. Letter for work provided.  F/u as originally scheduled.  The patient voiced understanding and agreement to the plan.  [place], DO 03/27/20  11:57 AM

## 2020-05-29 ENCOUNTER — Encounter: Payer: Self-pay | Admitting: Family Medicine

## 2020-05-29 ENCOUNTER — Other Ambulatory Visit: Payer: Self-pay

## 2020-05-29 ENCOUNTER — Ambulatory Visit (INDEPENDENT_AMBULATORY_CARE_PROVIDER_SITE_OTHER): Payer: Managed Care, Other (non HMO) | Admitting: Family Medicine

## 2020-05-29 VITALS — BP 142/86 | HR 72 | Temp 98.3°F | Ht 74.0 in | Wt 221.1 lb

## 2020-05-29 DIAGNOSIS — R5383 Other fatigue: Secondary | ICD-10-CM | POA: Diagnosis not present

## 2020-05-29 NOTE — Patient Instructions (Addendum)
I think this is due to your likely sleep apnea.   Try to elevate the head of the bed at night. This can help.   Let us know if you need anything.

## 2020-05-29 NOTE — Progress Notes (Signed)
Chief Complaint  Patient presents with   Insomnia    Subjective: Patient is a 60 y.o. male here for fatigue.  Has home sleep study on 8/9. Has been very tired as his sleep is interrupted by his own snoring. Would like a letter for a day to rest.    Past Medical History:  Diagnosis Date   No known health problems     Objective: BP (!) 142/86 (BP Location: Left Arm, Patient Position: Sitting, Cuff Size: Normal)    Pulse 72    Temp 98.3 F (36.8 C) (Oral)    Ht 6\' 2"  (1.88 m)    Wt (!) 221 lb 2 oz (100.3 kg)    SpO2 98%    BMI 28.39 kg/m  General: Awake, appears stated age Heart: RRR, no LE edema Lungs: CTAB, no rales, wheezes or rhonchi. No accessory muscle use Psych: Age appropriate judgment and insight, normal affect and mood  Assessment and Plan: Fatigue, unspecified type  Letter for work given. BP noted, will wait to see if OSA and tx that as this will likely come down.  F/u as originally scheduled.  The patient voiced understanding and agreement to the plan.  [place], DO 05/29/20  10:24 AM

## 2020-06-08 ENCOUNTER — Other Ambulatory Visit: Payer: Self-pay

## 2020-06-08 ENCOUNTER — Ambulatory Visit (INDEPENDENT_AMBULATORY_CARE_PROVIDER_SITE_OTHER): Payer: Managed Care, Other (non HMO) | Admitting: Family Medicine

## 2020-06-08 ENCOUNTER — Encounter: Payer: Self-pay | Admitting: Family Medicine

## 2020-06-08 VITALS — BP 130/82 | HR 79 | Temp 98.1°F | Ht 74.0 in | Wt 216.4 lb

## 2020-06-08 DIAGNOSIS — R0681 Apnea, not elsewhere classified: Secondary | ICD-10-CM | POA: Diagnosis not present

## 2020-06-08 NOTE — Progress Notes (Signed)
Chief Complaint  Patient presents with  . Insomnia    Subjective: Patient is a 59 y.o. male here for insomnia.  Pt saw sleep team last week and had HST. Going over results this PM with them. They think he likely has OSA. 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 Requesting letter that states such so he can transfer to another dept.   Past Medical History:  Diagnosis Date  . No known health problems     Objective: BP 130/82 (BP Location: Left Arm, Patient Position: Sitting, Cuff Size: Large)   Pulse 79   Temp 98.1 F (36.7 C) (Oral)   Ht 6\' 2"  (1.88 m)   Wt 216 lb 6 oz (98.1 kg)   SpO2 99%   BMI 27.78 kg/m  General: Awake, appears stated age Lungs: No accessory muscle use Psych: Age appropriate judgment and insight, normal affect and mood  Assessment and Plan: Witnessed apneic spells  Will write out for work again for a few days. Letter stating his health would benefit in normal climate provided. F/u as originally scheduled.  The patient voiced understanding and agreement to the plan.  [place], DO 06/08/20  10:10 AM

## 2020-06-08 NOTE — Patient Instructions (Signed)
Let us know if you need anything.  

## 2020-07-27 ENCOUNTER — Emergency Department (HOSPITAL_BASED_OUTPATIENT_CLINIC_OR_DEPARTMENT_OTHER): Payer: Managed Care, Other (non HMO)

## 2020-07-27 ENCOUNTER — Other Ambulatory Visit: Payer: Self-pay

## 2020-07-27 ENCOUNTER — Emergency Department (HOSPITAL_BASED_OUTPATIENT_CLINIC_OR_DEPARTMENT_OTHER)
Admission: EM | Admit: 2020-07-27 | Discharge: 2020-07-27 | Disposition: A | Discharge status: Home / Self Care | Payer: Managed Care, Other (non HMO) | Attending: Emergency Medicine | Admitting: Emergency Medicine

## 2020-07-27 ENCOUNTER — Encounter (HOSPITAL_BASED_OUTPATIENT_CLINIC_OR_DEPARTMENT_OTHER): Payer: Self-pay | Admitting: Emergency Medicine

## 2020-07-27 DIAGNOSIS — Z87891 Personal history of nicotine dependence: Secondary | ICD-10-CM | POA: Diagnosis not present

## 2020-07-27 DIAGNOSIS — M545 Low back pain, unspecified: Secondary | ICD-10-CM

## 2020-07-27 DIAGNOSIS — M549 Dorsalgia, unspecified: Secondary | ICD-10-CM | POA: Diagnosis present

## 2020-07-27 HISTORY — DX: Sleep apnea, unspecified: G47.30

## 2020-07-27 MED ORDER — CYCLOBENZAPRINE HCL 10 MG PO TABS: 10.0000 mg | ORAL_TABLET | Freq: Two times a day (BID) | ORAL | 0 refills | Status: DC | PRN

## 2020-07-27 MED ORDER — METHYLPREDNISOLONE 4 MG PO TBPK: ORAL_TABLET | ORAL | 0 refills | Status: DC

## 2020-07-27 MED ORDER — KETOROLAC TROMETHAMINE 15 MG/ML IJ SOLN
15.0000 mg | Freq: Once | INTRAMUSCULAR | Status: AC
  Administered 2020-07-27: 18:00:00 15 mg via INTRAMUSCULAR
  Filled 2020-07-27: qty 1

## 2020-07-27 NOTE — ED Triage Notes (Signed)
States has had back pain since week. Pain worse when going from a sitting to standing . Pain to mid back. Denies urinary s/s, has had tried any meds OTC for pain

## 2020-07-27 NOTE — Discharge Instructions (Signed)
Your work-up has been reassuring in the emergency department today.  Your x-ray does show some degenerative changes.  Your symptoms could be consistent with a bulging disc.  We will give you a short course of steroids and muscle relaxer to take at home.  You can apply topical lidocaine patches.  Perform stretches.  Will need follow-up with orthopedic doctor as you may need further management with MRI.  Return the ER if you develop any loss of your bowel or bladder inability to urinate or numbness in your groin area.

## 2020-07-27 NOTE — ED Provider Notes (Signed)
[facility] EMERGENCY DEPARTMENT Provider Note   CSN: [id number] Arrival date & time: 07/27/20  1511     History Chief Complaint  Patient presents with  . Back Pain    Allen Sawyer is a 59 y.o. male.  HPI 59 year old male presents to the ER for evaluation of 1 week of back pain.  Patient reports that he is a fairly active and athletic person.  Patient reports he is having some back pain to his mid spine over the past several weeks.  Patient reports that he has had no known trauma to the area.  No loss of bowel or bladder, saddle paresthesias, urinary retention, history of IV drug use, history of cancer.  He is taken no medications for symptoms prior to arrival.  It is worse when he goes from a sitting to standing position also when he is sleeping at night.  Patient has had no fevers, night sweats or weight loss.  Patient does have a history of back pain but has never seen anybody for management.    Past Medical History:  Diagnosis Date  . No known health problems   . Sleep apnea     Patient Active Problem List   Diagnosis Date Noted  . Back pain 08/22/2011    Past Surgical History:  Procedure Laterality Date  . APPENDECTOMY         Family History  Problem Relation Age of Onset  . Hyperlipidemia Mother   . Hypertension Mother   . Hyperlipidemia Father   . Hypertension Father   . Heart attack Father   . Hyperlipidemia Sister   . Hypertension Sister   . Hypertension Brother   . Hyperlipidemia Brother   . Diabetes Brother   . Heart attack Paternal Uncle   . Sudden death Neg Hx     Social History   Tobacco Use  . Smoking status: Former Games developer  . Smokeless tobacco: Never Used  Substance Use Topics  . Alcohol use: Yes    Comment: occ  . Drug use: No    Home Medications Prior to Admission medications   Medication Sig Start Date End Date Taking? Authorizing Provider  cyclobenzaprine (FLEXERIL) 10 MG tablet Take 1 tablet (10 mg total) by mouth 2  (two) times daily as needed for muscle spasms. 07/27/20   Rise Mu, PA-C  methylPREDNISolone (MEDROL DOSEPAK) 4 MG TBPK tablet Take as directed 07/27/20   Rise Mu, PA-C    Allergies    Patient has no known allergies.  Review of Systems   Review of Systems  Constitutional: Negative for chills and fever.  HENT: Negative for congestion.   Eyes: Negative for discharge.  Respiratory: Negative for cough.   Genitourinary: Negative for difficulty urinating, dysuria, frequency, hematuria and urgency.  Musculoskeletal: Positive for back pain.  Skin: Negative for color change.  Neurological: Negative for weakness and numbness.  Psychiatric/Behavioral: Negative for confusion.    Physical Exam Updated Vital Signs BP (!) 151/84 (BP Location: Left Arm)   Pulse 81   Temp 98.8 F (37.1 C) (Oral)   Resp 18   Ht 6\' 2"  (1.88 m)   Wt 100.1 kg   SpO2 98%   BMI 28.34 kg/m   Physical Exam Vitals and nursing note reviewed.  Constitutional:      General: He is not in acute distress.    Appearance: He is well-developed.  HENT:     Head: Normocephalic and atraumatic.  Eyes:     General: No  scleral icterus.       Right eye: No discharge.        Left eye: No discharge.  Cardiovascular:     Pulses: Normal pulses.  Pulmonary:     Effort: No respiratory distress.  Musculoskeletal:        General: Normal range of motion.     Cervical back: Normal range of motion.     Comments: Patient with midline L-spine tenderness without any deformity step-offs noted.  There is no paraspinal musculature tenderness.  No rashes appreciated.  No CVA tenderness.  Skin:    General: Skin is warm and dry.     Capillary Refill: Capillary refill takes less than 2 seconds.     Coloration: Skin is not pale.  Neurological:     Mental Status: He is alert.     Sensory: No sensory deficit.     Motor: No weakness.  Psychiatric:        Behavior: Behavior normal.        Thought Content: Thought  content normal.        Judgment: Judgment normal.     ED Results / Procedures / Treatments   Labs (all labs ordered are listed, but only abnormal results are displayed) Labs Reviewed - No data to display  EKG None  Radiology DG Lumbar Spine Complete  Result Date: 07/27/2020 CLINICAL DATA:  Low back pain EXAM: LUMBAR SPINE - COMPLETE 4+ VIEW COMPARISON:  08/18/2011 FINDINGS: Lumbar alignment within normal limits. Vertebral body heights are maintained. Diffuse degenerative changes throughout the lumbar spine with moderate disease at L4-L5 and L5-S1. Facet degenerative changes at multiple levels. 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 Findings are progressed since 2012 comparison. Electronically Signed   By: Jasmine Pang M.D.   On: 07/27/2020 17:52    Procedures Procedures (including critical care time)  Medications Ordered in ED Medications  ketorolac (TORADOL) 15 MG/ML injection 15 mg (15 mg Intramuscular Given 07/27/20 1756)    ED Course  I have reviewed the triage vital signs and the nursing notes.  Pertinent labs & imaging results that were available during my care of the patient were reviewed by me and considered in my medical decision making (see chart for details).    MDM Rules/Calculators/A&P                          Patient with back pain.  No neurological deficits and normal neuro exam.  Patient can walk but states is painful.  No loss of bowel or bladder control.  No concern for cauda equina.  No fever, night sweats, weight loss, h/o cancer, IVDU.  RICE protocol and pain medicine indicated and discussed with patient.   Final Clinical Impression(s) / ED Diagnoses Final diagnoses:  Acute bilateral low back pain without sciatica    Rx / DC Orders ED Discharge Orders         Ordered    methylPREDNISolone (MEDROL DOSEPAK) 4 MG TBPK tablet        07/27/20 1800    cyclobenzaprine (FLEXERIL) 10 MG tablet  2 times daily PRN         07/27/20 1800           Rise Mu, PA-C 07/27/20 1805    [place], Ambrose [place], MD 07/28/20 [phone]

## 2020-07-31 ENCOUNTER — Encounter: Payer: Self-pay | Admitting: Family Medicine

## 2020-07-31 ENCOUNTER — Telehealth (INDEPENDENT_AMBULATORY_CARE_PROVIDER_SITE_OTHER): Payer: Managed Care, Other (non HMO) | Admitting: Family Medicine

## 2020-07-31 ENCOUNTER — Other Ambulatory Visit: Payer: Self-pay

## 2020-07-31 DIAGNOSIS — J0140 Acute pansinusitis, unspecified: Secondary | ICD-10-CM

## 2020-07-31 MED ORDER — FLUTICASONE PROPIONATE 50 MCG/ACT NA SUSP: 2.0000 | Freq: Every day | NASAL | 6 refills | Status: DC

## 2020-07-31 MED ORDER — AMOXICILLIN-POT CLAVULANATE 875-125 MG PO TABS: 1.0000 | ORAL_TABLET | Freq: Two times a day (BID) | ORAL | 0 refills | Status: DC

## 2020-07-31 NOTE — Progress Notes (Signed)
Virtual Visit via Video Note  I connected with Allen Sawyer on 07/31/20 at 11:20 AM EDT by a video enabled telemedicine application and verified that I am speaking with the correct person using two identifiers.  Location: Patient: home alone  Provider: office    I discussed the limitations of evaluation and management by telemedicine and the availability of in person appointments. The patient expressed understanding and agreed to proceed.  History of Present Illness: Pt is home c/o sinus congestion / pressure    Pt has taken tylenol extra strength    Past Medical History:  Diagnosis Date  . No known health problems   . Sleep apnea    Outpatient Encounter Medications as of 07/31/2020  Medication Sig  . cyclobenzaprine (FLEXERIL) 10 MG tablet Take 1 tablet (10 mg total) by mouth 2 (two) times daily as needed for muscle spasms.  Marland Kitchen amoxicillin-clavulanate (AUGMENTIN) 875-125 MG tablet Take 1 tablet by mouth 2 (two) times daily.  . fluticasone (FLONASE) 50 MCG/ACT nasal spray Place 2 sprays into both nostrils daily.  . methylPREDNISolone (MEDROL DOSEPAK) 4 MG TBPK tablet Take as directed (Patient not taking: Reported on 07/31/2020)   No facility-administered encounter medications on file as of 07/31/2020.   Observations/Objective: There were no vitals filed for this visit. No fever  Pt is in NAD, no sob  Assessment and Plan: 1. Acute non-recurrent pansinusitis abx per orders and flonase Pt will get covid test if no improvement or if symptoms worsen  - amoxicillin-clavulanate (AUGMENTIN) 875-125 MG tablet; Take 1 tablet by mouth 2 (two) times daily.  Dispense: 20 tablet; Refill: 0 - fluticasone (FLONASE) 50 MCG/ACT nasal spray; Place 2 sprays into both nostrils daily.  Dispense: 16 g; Refill: 6   Follow Up Instructions:    I discussed the assessment and treatment plan with the patient. The patient was provided an opportunity to ask questions and all were answered. The patient agreed  with the plan and demonstrated an understanding of the instructions.   The patient was advised to call back or seek an in-person evaluation if the symptoms worsen or if the condition fails to improve as anticipated.  I provided 25 minutes of non-face-to-face time during this encounter.   Donato Schultz, DO

## 2020-08-04 ENCOUNTER — Telehealth: Payer: Self-pay | Admitting: Family Medicine

## 2020-08-04 NOTE — Telephone Encounter (Signed)
Called pt to offer ED follow up care Appt, he states is feeling much better & declined.  --fyi.  -glh

## 2020-08-05 ENCOUNTER — Other Ambulatory Visit: Payer: Self-pay

## 2020-08-05 ENCOUNTER — Encounter: Payer: Self-pay | Admitting: Family Medicine

## 2020-08-05 ENCOUNTER — Ambulatory Visit (INDEPENDENT_AMBULATORY_CARE_PROVIDER_SITE_OTHER): Payer: Managed Care, Other (non HMO) | Admitting: Family Medicine

## 2020-08-05 DIAGNOSIS — M545 Low back pain, unspecified: Secondary | ICD-10-CM | POA: Diagnosis not present

## 2020-08-05 NOTE — Progress Notes (Signed)
Allen Sawyer - 59 y.o. male MRN [id number]  Date of birth: [birth year]  SUBJECTIVE:  Including CC & ROS.  Chief Complaint  Patient presents with  . Back Pain    bilateral low back x 2 weeks    Allen Sawyer is a 59 y.o. male that is presenting with acute on chronic low back pain.  He feels much improved compared to what he fell earlier.  He has had occurrences of low back pain at different points.  He has never had this severe.  Denies any radicular component.  No history of surgery..  Independent review of the lumbar spine x-ray from 9/27 shows degenerative disc changes at L5-S1 as well as facet changes.   Review of Systems See HPI   HISTORY: Past Medical, Surgical, Social, and Family History Reviewed & Updated per EMR.   Pertinent Historical Findings include:  Past Medical History:  Diagnosis Date  . No known health problems   . Sleep apnea     Past Surgical History:  Procedure Laterality Date  . APPENDECTOMY      Family History  Problem Relation Age of Onset  . Hyperlipidemia Mother   . Hypertension Mother   . Hyperlipidemia Father   . Hypertension Father   . Heart attack Father   . Hyperlipidemia Sister   . Hypertension Sister   . Hypertension Brother   . Hyperlipidemia Brother   . Diabetes Brother   . Heart attack Paternal Uncle   . Sudden death Neg Hx     Social History   Socioeconomic History  . Marital status: Married    Spouse name: Not on file  . Number of children: Not on file  . Years of education: Not on file  . Highest education level: Not on file  Occupational History  . Not on file  Tobacco Use  . Smoking status: Former Games developer  . Smokeless tobacco: Never Used  Substance and Sexual Activity  . Alcohol use: Yes    Comment: occ  . Drug use: No  . Sexual activity: Not on file  Other Topics Concern  . Not on file  Social History Narrative  . Not on file   Social Determinants of Health   Financial Resource Strain:   . Difficulty of  Paying Living Expenses: Not on file  Food Insecurity:   . Worried About Programme researcher, broadcasting/film/video in the Last Year: Not on file  . Ran Out of Food in the Last Year: Not on file  Transportation Needs:   . Lack of Transportation (Medical): Not on file  . Lack of Transportation (Non-Medical): Not on file  Physical Activity:   . Days of Exercise per Week: Not on file  . Minutes of Exercise per Session: Not on file  Stress:   . Feeling of Stress : Not on file  Social Connections:   . Frequency of Communication with Friends and Family: Not on file  . Frequency of Social Gatherings with Friends and Family: Not on file  . Attends Religious Services: Not on file  . Active Member of Clubs or Organizations: Not on file  . Attends Banker Meetings: Not on file  . Marital Status: Not on file  Intimate Partner Violence:   . Fear of Current or Ex-Partner: Not on file  . Emotionally Abused: Not on file  . Physically Abused: Not on file  . Sexually Abused: Not on file     PHYSICAL EXAM:  VS: BP (!) 147/92  Pulse 65   Ht 6\' 2"  (1.88 m)   Wt 217 lb (98.4 kg)   BMI 27.86 kg/m  Physical Exam Gen: NAD, alert, cooperative with exam, well-appearing MSK:  Back: Normal flexion extension. Normal strength resistance. Neurovascular intact     ASSESSMENT & PLAN:   Low back pain Acute on chronic in nature.  Has had improvement from his most recent exacerbation.  He does have degenerative changes appreciated on x-ray. -Counseled on home exercise therapy and supportive care. -Counseled on compression and heat. -Provided work note. -Could consider physical therapy.

## 2020-08-05 NOTE — Assessment & Plan Note (Addendum)
Acute on chronic in nature.  Has had improvement from his most recent exacerbation.  He does have degenerative changes appreciated on x-ray. -Counseled on home exercise therapy and supportive care. -Counseled on compression and heat. -Provided work note. -Could consider physical therapy.

## 2020-08-05 NOTE — Patient Instructions (Signed)
Nice to meet you Please try compression if needed  Please try heat  Please try the exercises  Please send me a message in MyChart with any questions or updates.  Please see me back in 4 weeks.   --Dr. Jordan Likes

## 2020-08-18 ENCOUNTER — Ambulatory Visit (HOSPITAL_BASED_OUTPATIENT_CLINIC_OR_DEPARTMENT_OTHER)
Admission: RE | Admit: 2020-08-18 | Discharge: 2020-08-18 | Disposition: A | Discharge status: Home / Self Care | Payer: Managed Care, Other (non HMO) | Source: Ambulatory Visit | Attending: Family Medicine | Admitting: Family Medicine

## 2020-08-18 ENCOUNTER — Other Ambulatory Visit: Payer: Self-pay

## 2020-08-18 ENCOUNTER — Encounter: Payer: Self-pay | Admitting: Family Medicine

## 2020-08-18 ENCOUNTER — Ambulatory Visit (INDEPENDENT_AMBULATORY_CARE_PROVIDER_SITE_OTHER): Payer: Managed Care, Other (non HMO) | Admitting: Family Medicine

## 2020-08-18 VITALS — BP 151/94 | HR 64 | Ht 74.0 in | Wt 220.0 lb

## 2020-08-18 DIAGNOSIS — M546 Pain in thoracic spine: Secondary | ICD-10-CM | POA: Insufficient documentation

## 2020-08-18 NOTE — Progress Notes (Signed)
Fox Salminen - 59 y.o. male MRN [id number]  Date of birth: 03/24/1961  SUBJECTIVE:  Including CC & ROS.  Chief Complaint  Patient presents with  . Back Pain    mid back    Teague Goynes is a 59 y.o. male that is presenting with acute midthoracic back pain.  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  Pain is localized to the mid back.   Review of Systems See HPI   HISTORY: Past Medical, Surgical, Social, and Family History Reviewed & Updated per EMR.   Pertinent Historical Findings include:  Past Medical History:  Diagnosis Date  . No known health problems   . Sleep apnea     Past Surgical History:  Procedure Laterality Date  . APPENDECTOMY      Family History  Problem Relation Age of Onset  . Hyperlipidemia Mother   . Hypertension Mother   . Hyperlipidemia Father   . Hypertension Father   . Heart attack Father   . Hyperlipidemia Sister   . Hypertension Sister   . Hypertension Brother   . Hyperlipidemia Brother   . Diabetes Brother   . Heart attack Paternal Uncle   . Sudden death Neg Hx     Social History   Socioeconomic History  . Marital status: Married    Spouse name: Not on file  . Number of children: Not on file  . Years of education: Not on file  . Highest education level: Not on file  Occupational History  . Not on file  Tobacco Use  . Smoking status: Former Games developer  . Smokeless tobacco: Never Used  Substance and Sexual Activity  . Alcohol use: Yes    Comment: occ  . Drug use: No  . Sexual activity: Not on file  Other Topics Concern  . Not on file  Social History Narrative  . Not on file   Social Determinants of Health   Financial Resource Strain:   . Difficulty of Paying Living Expenses: Not on file  Food Insecurity:   . Worried About Programme researcher, broadcasting/film/video in the Last Year: Not on file  . Ran Out of Food in the Last Year: Not on file  Transportation Needs:   . Lack of Transportation (Medical): Not on file  .  Lack of Transportation (Non-Medical): Not on file  Physical Activity:   . Days of Exercise per Week: Not on file  . Minutes of Exercise per Session: Not on file  Stress:   . Feeling of Stress : Not on file  Social Connections:   . Frequency of Communication with Friends and Family: Not on file  . Frequency of Social Gatherings with Friends and Family: Not on file  . Attends Religious Services: Not on file  . Active Member of Clubs or Organizations: Not on file  . Attends Banker Meetings: Not on file  . Marital Status: Not on file  Intimate Partner Violence:   . Fear of Current or Ex-Partner: Not on file  . Emotionally Abused: Not on file  . Physically Abused: Not on file  . Sexually Abused: Not on file     PHYSICAL EXAM:  VS: BP (!) 151/94   Pulse 64   Ht 6\' 2"  (1.88 m)   Wt 220 lb (99.8 kg)   BMI 28.25 kg/m  Physical Exam Gen: NAD, alert, cooperative with exam, well-appearing MSK:  Back: Normal range of motion. Tenderness palpation of the midline  spine. Neurovascular intact     ASSESSMENT & PLAN:   Acute midline thoracic back pain Pain occurring in the mid thoracic back.  Concern for compression fracture given trauma. -Counseled on home exercise therapy and supportive care. -X-ray. -Referral to physical therapy. -Provided work note.

## 2020-08-18 NOTE — Patient Instructions (Addendum)
Good to see you  Please try heat  Physical therapy will give you a call. I will call with the results from today   Please send me a message in MyChart with any questions or updates.  Please see me back in 4 weeks.   --Dr. Jordan Likes

## 2020-08-19 ENCOUNTER — Telehealth: Payer: Self-pay | Admitting: Family Medicine

## 2020-08-19 ENCOUNTER — Ambulatory Visit: Payer: Managed Care, Other (non HMO) | Admitting: Family Medicine

## 2020-08-19 DIAGNOSIS — M546 Pain in thoracic spine: Secondary | ICD-10-CM | POA: Insufficient documentation

## 2020-08-19 NOTE — Telephone Encounter (Signed)
Spoke to patient and gave result information as provided by the physician. 

## 2020-08-19 NOTE — Assessment & Plan Note (Signed)
Pain occurring in the mid thoracic back.  Concern for compression fracture given trauma. -Counseled on home exercise therapy and supportive care. -X-ray. -Referral to physical therapy. -Provided work note.

## 2020-08-19 NOTE — Telephone Encounter (Signed)
Patient called states he missed provider's call w./X-ray results and ask someone to call him back @ [phone].   Forwarding message to med asst:   Left VM for patient. If he calls back please have him speak with a nurse/CMA and inform that his xray shows degenerative changes at multiple levels of the thoracic spine.   If any questions then please take the best time and phone number to call and I will try to call him back.   Myra Rude, MD [facility] 08/19/2020, 10:19 AM  --Fausto Skillern

## 2020-08-19 NOTE — Telephone Encounter (Signed)
Left VM for patient. If he calls back please have him speak with a nurse/CMA and inform that his xray shows degenerative changes at multiple levels of the thoracic spine.   If any questions then please take the best time and phone number to call and I will try to call him back.   Myra Rude, MD [facility] 08/19/2020, 10:19 AM

## 2020-08-31 ENCOUNTER — Other Ambulatory Visit: Payer: Self-pay

## 2020-08-31 ENCOUNTER — Ambulatory Visit (INDEPENDENT_AMBULATORY_CARE_PROVIDER_SITE_OTHER): Payer: Managed Care, Other (non HMO) | Admitting: Family Medicine

## 2020-08-31 ENCOUNTER — Encounter: Payer: Self-pay | Admitting: Family Medicine

## 2020-08-31 VITALS — BP 158/93 | HR 68 | Ht 74.0 in | Wt 220.0 lb

## 2020-08-31 DIAGNOSIS — M545 Low back pain, unspecified: Secondary | ICD-10-CM

## 2020-08-31 DIAGNOSIS — M546 Pain in thoracic spine: Secondary | ICD-10-CM

## 2020-08-31 NOTE — Progress Notes (Signed)
Allen Sawyer - 59 y.o. male MRN [id number]  Date of birth: 17-Aug-1961  SUBJECTIVE:  Including CC & ROS.  Chief Complaint  Patient presents with  . Follow-up    midline thoracic back    Allen Sawyer is a 59 y.o. male that is following up for his low back and mid back pain.  His symptoms are still ongoing.  This could be exacerbated with certain movements of reaching out or reaching up.   Review of Systems See HPI   HISTORY: Past Medical, Surgical, Social, and Family History Reviewed & Updated per EMR.   Pertinent Historical Findings include:  Past Medical History:  Diagnosis Date  . No known health problems   . Sleep apnea     Past Surgical History:  Procedure Laterality Date  . APPENDECTOMY      Family History  Problem Relation Age of Onset  . Hyperlipidemia Mother   . Hypertension Mother   . Hyperlipidemia Father   . Hypertension Father   . Heart attack Father   . Hyperlipidemia Sister   . Hypertension Sister   . Hypertension Brother   . Hyperlipidemia Brother   . Diabetes Brother   . Heart attack Paternal Uncle   . Sudden death Neg Hx     Social History   Socioeconomic History  . Marital status: Married    Spouse name: Not on file  . Number of children: Not on file  . Years of education: Not on file  . Highest education level: Not on file  Occupational History  . Not on file  Tobacco Use  . Smoking status: Former Games developer  . Smokeless tobacco: Never Used  Substance and Sexual Activity  . Alcohol use: Yes    Comment: occ  . Drug use: No  . Sexual activity: Not on file  Other Topics Concern  . Not on file  Social History Narrative  . Not on file   Social Determinants of Health   Financial Resource Strain:   . Difficulty of Paying Living Expenses: Not on file  Food Insecurity:   . Worried About Programme researcher, broadcasting/film/video in the Last Year: Not on file  . Ran Out of Food in the Last Year: Not on file  Transportation Needs:   . Lack of Transportation  (Medical): Not on file  . Lack of Transportation (Non-Medical): Not on file  Physical Activity:   . Days of Exercise per Week: Not on file  . Minutes of Exercise per Session: Not on file  Stress:   . Feeling of Stress : Not on file  Social Connections:   . Frequency of Communication with Friends and Family: Not on file  . Frequency of Social Gatherings with Friends and Family: Not on file  . Attends Religious Services: Not on file  . Active Member of Clubs or Organizations: Not on file  . Attends Banker Meetings: Not on file  . Marital Status: Not on file  Intimate Partner Violence:   . Fear of Current or Ex-Partner: Not on file  . Emotionally Abused: Not on file  . Physically Abused: Not on file  . Sexually Abused: Not on file     PHYSICAL EXAM:  VS: BP (!) 158/93   Pulse 68   Ht 6\' 2"  (1.88 m)   Wt 220 lb (99.8 kg)   BMI 28.25 kg/m  Physical Exam Gen: NAD, alert, cooperative with exam, well-appearing    ASSESSMENT & PLAN:   Acute midline thoracic back  pain Previous imaging has demonstrated minor degenerative changes of thoracic spine. -Counseled on home exercise therapy and supportive care. -Provided work note. -Referral to physical therapy.  Low back pain Has had ongoing pain.  Intermittent in nature pain exacerbated with certain movements -Counseled on home exercise therapy and supportive care. -Referral to physical therapy.

## 2020-08-31 NOTE — Assessment & Plan Note (Addendum)
Previous imaging has demonstrated minor degenerative changes of thoracic spine. -Counseled on home exercise therapy and supportive care. -Provided work note. -Referral to physical therapy.

## 2020-08-31 NOTE — Patient Instructions (Signed)
Good to see you Please try heat  Please continue the exercises  Physical therapy will give you a call   Please send me a message in MyChart with any questions or updates.  Please see me back in 6-8 weeks.   --Dr. Jordan Likes

## 2020-08-31 NOTE — Assessment & Plan Note (Signed)
Has had ongoing pain.  Intermittent in nature pain exacerbated with certain movements -Counseled on home exercise therapy and supportive care. -Referral to physical therapy.

## 2020-09-02 ENCOUNTER — Ambulatory Visit: Payer: Managed Care, Other (non HMO) | Admitting: Family Medicine

## 2020-09-04 ENCOUNTER — Other Ambulatory Visit: Payer: Self-pay

## 2020-09-04 ENCOUNTER — Ambulatory Visit (INDEPENDENT_AMBULATORY_CARE_PROVIDER_SITE_OTHER): Payer: Managed Care, Other (non HMO) | Admitting: Family Medicine

## 2020-09-04 ENCOUNTER — Encounter: Payer: Self-pay | Admitting: Family Medicine

## 2020-09-04 VITALS — BP 122/82 | HR 71 | Temp 98.8°F | Ht 74.0 in | Wt 221.2 lb

## 2020-09-04 DIAGNOSIS — Z1211 Encounter for screening for malignant neoplasm of colon: Secondary | ICD-10-CM | POA: Diagnosis not present

## 2020-09-04 DIAGNOSIS — D509 Iron deficiency anemia, unspecified: Secondary | ICD-10-CM | POA: Diagnosis not present

## 2020-09-04 DIAGNOSIS — Z0000 Encounter for general adult medical examination without abnormal findings: Secondary | ICD-10-CM | POA: Diagnosis not present

## 2020-09-04 DIAGNOSIS — Z125 Encounter for screening for malignant neoplasm of prostate: Secondary | ICD-10-CM

## 2020-09-04 DIAGNOSIS — E663 Overweight: Secondary | ICD-10-CM

## 2020-09-04 NOTE — Progress Notes (Signed)
Chief Complaint  Patient presents with  . Annual Exam    Well Male Allen Sawyer is here for a complete physical.   His last physical was last year.  Current diet: in general, a "healthy" diet.  Current exercise: rehabbing his thoracic; stretching Weight trend: stable Fatigue out of ordinary? No. Seat belt? Yes.    Health maintenance Shingrix- No Colonoscopy- No Tetanus- Yes HIV- Yes Hep C- Yes   Past Medical History:  Diagnosis Date  . Sleep apnea       Past Surgical History:  Procedure Laterality Date  . APPENDECTOMY      Medications  Takes no meds routinely.    Allergies No Known Allergies  Family History Family History  Problem Relation Age of Onset  . Hyperlipidemia Mother   . Hypertension Mother   . Hyperlipidemia Father   . Hypertension Father   . Heart attack Father   . Hyperlipidemia Sister   . Hypertension Sister   . Hypertension Brother   . Hyperlipidemia Brother   . Diabetes Brother   . Heart attack Paternal Uncle   . Sudden death Neg Hx     Review of Systems: Constitutional:  no fevers Eye:  no recent significant change in vision Ear/Nose/Mouth/Throat:  Ears:  no hearing loss Nose/Mouth/Throat:  no complaints of nasal congestion, no sore throat Cardiovascular:  no chest pain Respiratory:  no shortness of breath Gastrointestinal:  no change in bowel habits GU:  Male: negative for dysuria, frequency Musculoskeletal/Extremities:  no joint pain Integumentary (Skin/Breast):  no abnormal skin lesions reported Neurologic:  no headaches Endocrine: No unexpected weight changes Hematologic/Lymphatic:  no abnormal bleeding  Exam BP 122/82 (BP Location: Left Arm, Patient Position: Sitting, Cuff Size: Normal)   Pulse 71   Temp 98.8 F (37.1 C) (Oral)   Ht 6\' 2"  (1.88 m)   Wt 221 lb 4 oz (100.4 kg)   SpO2 97%   BMI 28.41 kg/m  General:  well developed, well nourished, in no apparent distress Skin:  no significant moles, warts, or  growths Head:  no masses, lesions, or tenderness Eyes:  pupils equal and round, sclera anicteric without injection Ears:  canals without lesions, TMs shiny without retraction, no obvious effusion, no erythema Nose:  nares patent, septum midline, mucosa normal Throat/Pharynx:  lips and gingiva without lesion; tongue and uvula midline; non-inflamed pharynx; no exudates or postnasal drainage Neck: neck supple without adenopathy, thyromegaly, or masses Cardiac: RRR, no bruits, no LE edema Lungs:  clear to auscultation, breath sounds equal bilaterally, no respiratory distress Rectal: Deferred Musculoskeletal:  symmetrical muscle groups noted without atrophy or deformity Neuro:  gait normal; deep tendon reflexes normal and symmetric Psych: well oriented with normal range of affect and appropriate judgment/insight  Assessment and Plan  Well adult exam  Overweight (BMI 25.0-29.9) - Plan: Comprehensive metabolic panel, Lipid panel  Iron deficiency anemia, unspecified iron deficiency anemia type - Plan: CBC  Screen for colon cancer - Plan: Ambulatory referral to Gastroenterology  Screening for prostate cancer - Plan: PSA   Well 59 y.o. male. Counseled on diet and exercise. Counseled on risks and benefits of prostate cancer screening with PSA. The patient agrees to undergo testing. Immunizations, labs, and further orders as above. Follow up in 1 yr. The patient voiced understanding and agreement to the plan.  [address] [place], DO 09/04/20 8:20 AM

## 2020-09-04 NOTE — Patient Instructions (Signed)
Give us 2-3 business days to get the results of your labs back.   Keep the diet clean and stay active.  The new Shingrix vaccine (for shingles) is a 2 shot series. It can make people feel low energy, achy and almost like they have the flu for 48 hours after injection. Please plan accordingly when deciding on when to get this shot. Call our office for a nurse visit appointment to get this. The second shot of the series is less severe regarding the side effects, but it still lasts 48 hours.   If you do not hear anything about your referral in the next 1-2 weeks, call our office and ask for an update.  Let us know if you need anything. 

## 2020-09-05 LAB — LIPID PANEL
Cholesterol: 193 mg/dL (ref <200)
HDL: 63 mg/dL (ref >=40)
LDL Cholesterol (Calc): 108 mg/dL (calc) — ABNORMAL HIGH
Non-HDL Cholesterol (Calc): 130 mg/dL (calc) — ABNORMAL HIGH (ref <130)
Total CHOL/HDL Ratio: 3.1 (calc) (ref <5.0)
Triglycerides: 108 mg/dL (ref <150)

## 2020-09-05 LAB — CBC
HCT: 42.1 % (ref 38.5–50.0)
Hemoglobin: 14.7 g/dL (ref 13.2–17.1)
MCH: 33.5 pg — ABNORMAL HIGH (ref 27.0–33.0)
MCHC: 34.9 g/dL (ref 32.0–36.0)
MCV: 95.9 fL (ref 80.0–100.0)
MPV: 11.5 fL (ref 7.5–12.5)
Platelets: 160 10*3/uL (ref 140–400)
RBC: 4.39 10*6/uL (ref 4.20–5.80)
RDW: 12.6 % (ref 11.0–15.0)
WBC: 4.4 10*3/uL (ref 3.8–10.8)

## 2020-09-05 LAB — COMPREHENSIVE METABOLIC PANEL
AG Ratio: 1.3 (calc) (ref 1.0–2.5)
ALT: 19 U/L (ref 9–46)
AST: 19 U/L (ref 10–35)
Albumin: 4.3 g/dL (ref 3.6–5.1)
Alkaline phosphatase (APISO): 55 U/L (ref 35–144)
BUN: 15 mg/dL (ref 7–25)
CO2: 28 mmol/L (ref 20–32)
Calcium: 9.7 mg/dL (ref 8.6–10.3)
Chloride: 103 mmol/L (ref 98–110)
Creat: 1.08 mg/dL (ref 0.70–1.33)
Globulin: 3.3 g/dL (calc) (ref 1.9–3.7)
Glucose, Bld: 92 mg/dL (ref 65–99)
Potassium: 4.6 mmol/L (ref 3.5–5.3)
Sodium: 140 mmol/L (ref 135–146)
Total Bilirubin: 0.5 mg/dL (ref 0.2–1.2)
Total Protein: 7.6 g/dL (ref 6.1–8.1)

## 2020-09-05 LAB — PSA: PSA: 0.24 ng/mL (ref <=4.0)

## 2020-09-09 ENCOUNTER — Encounter: Payer: Self-pay | Admitting: Gastroenterology

## 2020-09-17 ENCOUNTER — Ambulatory Visit: Payer: Managed Care, Other (non HMO) | Attending: Family Medicine | Admitting: Physical Therapy

## 2020-09-17 ENCOUNTER — Encounter: Payer: Self-pay | Admitting: Physical Therapy

## 2020-09-17 ENCOUNTER — Other Ambulatory Visit: Payer: Self-pay

## 2020-09-17 DIAGNOSIS — G8929 Other chronic pain: Secondary | ICD-10-CM | POA: Diagnosis present

## 2020-09-17 DIAGNOSIS — M545 Low back pain, unspecified: Secondary | ICD-10-CM | POA: Insufficient documentation

## 2020-09-17 DIAGNOSIS — M6281 Muscle weakness (generalized): Secondary | ICD-10-CM | POA: Insufficient documentation

## 2020-09-17 DIAGNOSIS — M62830 Muscle spasm of back: Secondary | ICD-10-CM | POA: Diagnosis present

## 2020-09-17 DIAGNOSIS — M546 Pain in thoracic spine: Secondary | ICD-10-CM | POA: Insufficient documentation

## 2020-09-17 NOTE — Patient Instructions (Signed)
    Home exercise program created by Namon Villarin, PT.  For questions, please contact Shelbia Scinto via phone at [phone] or email at [email or web address]  [facility] [place] [address]  [address] [place], [place], [address] Phone: [phone]   Fax:  [phone]    

## 2020-09-17 NOTE — Therapy (Signed)
[facility] [address]  [address] [place], [place], [address] Phone: [phone]   Fax:  [phone]  Physical Therapy Evaluation  Patient Details  Name: Juandaniel Manfredo MRN: [id number] Date of Birth: 02-03-1961 Referring Provider (PT): Clare Gandy, MD   Encounter Date: 09/17/2020   PT End of Session - 09/17/20 0804    Visit Number 1    Number of Visits 12    Date for PT Re-Evaluation 10/29/20    Authorization Type [employer]    PT Start Time 0804    PT Stop Time 0851    PT Time Calculation (min) 47 min    Activity Tolerance Patient tolerated treatment well    Behavior During Therapy [facility] for tasks assessed/performed           Past Medical History:  Diagnosis Date  . Sleep apnea     Past Surgical History:  Procedure Laterality Date  . APPENDECTOMY      There were no vitals filed for this visit.    Subjective Assessment - 09/17/20 0807    Subjective Pt reports his proximal LEs had gotten weak from not working out for a few years. Weakness was causing his back to hurt. LBP exacerbated by fall at work in July 2021 with L knee pain also triggered by fall.    Diagnostic tests 08/18/20 - Thoracic spine x-ray: Mild thoracic spine scoliosis. Diffuse severe multileveldegenerative change again noted. No acute abnormality identified.   07/24/20 - Lumbar x-ray: Diffuse degenerative changes, most notable at L4-L5 and L5-S1.    Patient Stated Goals "to be painfree and get back to working out"    Currently in Pain? Yes    Pain Score 5    4.5-5/10   Pain Location Back    Pain Orientation Mid;Lower   Thoracic > lumbar   Pain Descriptors / Indicators Spasm    Pain Type Chronic pain    Pain Radiating Towards none currently; reports L LE radiculopathy previously with LBP but better since performing MD prescibed exercises    Pain Onset Other (comment)   ~1 yr ago   Pain Frequency Intermittent    Aggravating Factors  twisting reach     Pain Relieving Factors Ibuprofen, stretches    Effect of Pain on Daily Activities cautious with reaching out to sides    Pain Score 5    Pain Location Knee    Pain Orientation Left;Medial    Pain Descriptors / Indicators --   stiffness   Pain Type Chronic pain    Pain Onset More than a month ago   July 2021 - at time of fal at work   Pain Frequency Intermittent    Aggravating Factors  going down steps    Pain Relieving Factors not necessary    Effect of Pain on Daily Activities difficulty going down stairs              [facility] PT Assessment - 09/17/20 0804      Assessment   Medical Diagnosis Thoracic > lumbar back pain; L knee pain    Referring Provider (PT) Clare Gandy, MD    Onset Date/Surgical Date --   ~1 yr   Hand Dominance Left    Next MD Visit 11/02/20    Prior Therapy none for current problem      Precautions   Precautions None      Restrictions   Weight Bearing Restrictions No      Balance Screen  Has the patient fallen in the past 6 months Yes    How many times? 1    Has the patient had a decrease in activity level because of a fear of falling?  No    Is the patient reluctant to leave their home because of a fear of falling?  No      Home Environment   Living Environment Private residence    Living Arrangements Spouse/significant other    Type of Home Apartment    Home Access Stairs to enter    Entrance Stairs-Number of Steps 3rd floor apt    Home Layout One level      Prior Function   Level of Independence Independent    Vocation Full time employment   out of work until January   Vocation Requirements works at [employer]; MD prescribed HEP at least 3x/wk; gym 4x/wk (cardio & weights)      Cognition   Overall Cognitive Status Within Functional Limits for tasks assessed      Observation/Other Assessments   Focus on Therapeutic Outcomes (FOTO)  Lumbar - 41% (59% limitation); Predicted 58% (42% limitation)      ROM / Strength    AROM / PROM / Strength AROM;Strength      AROM   AROM Assessment Site Lumbar    Lumbar Flexion fingertips to toes - tight    Lumbar Extension 50% limited - pain    Lumbar - Right Side Bend hand to lateral joint line of knee - tight    Lumbar - Left Side Bend hand to lateral joint line of knee - pain    Lumbar - Right Rotation 70% limted - tight    Lumbar - Left Rotation 80% limited - pain & tight      Strength   Strength Assessment Site Hip;Knee;Ankle    Right/Left Hip Right;Left    Right Hip Flexion 4+/5    Right Hip Extension 4+/5    Right Hip External Rotation  4+/5    Right Hip Internal Rotation 4+/5    Right Hip ABduction 4+/5    Right Hip ADduction 4+/5    Left Hip Flexion 4-/5    Left Hip Extension 4-/5    Left Hip External Rotation 4+/5    Left Hip Internal Rotation 4/5    Left Hip ABduction 4-/5    Left Hip ADduction 4/5    Right/Left Knee Right;Left    Right Knee Flexion 4+/5    Right Knee Extension 5/5    Left Knee Flexion 4-/5    Left Knee Extension 4-/5   pain at knee   Right/Left Ankle Right;Left    Right Ankle Dorsiflexion 4+/5    Right Ankle Plantar Flexion 5/5    Left Ankle Dorsiflexion 4/5    Left Ankle Plantar Flexion 4/5      Flexibility   Soft Tissue Assessment /Muscle Length yes    Hamstrings mod tight B    Quadriceps mod tight L>R    ITB mild tight B    Piriformis mod to severe tight L>R    Obturator Internus mod tight B                      Objective measurements completed on examination: See above findings.       [facility] Adult PT Treatment/Exercise - 09/17/20 0804      Exercises   Exercises Lumbar      Lumbar Exercises: Stretches  Passive Hamstring Stretch Left;30 seconds;1 rep   pt to perform bilaterally at home   Passive Hamstring Stretch Limitations supine with strap    Single Knee to Chest Stretch Left;30 seconds;1 rep    Single Knee to Chest Stretch Limitations opp LE straight    Lower Trunk Rotation Limitations 5  x 5"    Hip Flexor Stretch Left;30 seconds;1 rep    Hip Flexor Stretch Limitations mod thomas with strap    Piriformis Stretch Left;30 seconds;1 rep    Piriformis Stretch Limitations supine KTOS    Figure 4 Stretch 30 seconds;1 rep;Supine;With overpressure    Figure 4 Stretch Limitations B LE figure-4 to chest    Other Lumbar Stretch Exercise L sidelying trunk rotation/open book 5 x 5"                  PT Education - 09/17/20 0851    Education Details PT eval findings, anticipated POC and initial HEP    Person(s) Educated Patient    Methods Explanation;Demonstration;Verbal cues;Tactile cues;Handout    Comprehension Verbalized understanding;Verbal cues required;Tactile cues required;Returned demonstration;Need further instruction            PT Short Term Goals - 09/17/20 0851      PT SHORT TERM GOAL #1   Title Patient will be independent with initial HEP    Status New    Target Date 10/01/20      PT SHORT TERM GOAL #2   Title Patient will verbalize/demonstrate understanding of neutral spine posture and proper body mechanics to reduce strain on thoracolumbar spine    Status New    Target Date 10/08/20             PT Long Term Goals - 09/17/20 0851      PT LONG TERM GOAL #1   Title Patient will be independent with ongoing/advanced HEP +/- gym program for self-management at home    Status New    Target Date 10/29/20      PT LONG TERM GOAL #2   Title Patient to demonstrate appropriate posture and body mechanics needed for daily activities including return to work in January    Status New    Target Date 10/29/20      PT LONG TERM GOAL #3   Title Patient to report back and L knee pain reduction in frequency and intensity by >/= 50-75%    Status New    Target Date 10/29/20      PT LONG TERM GOAL #4   Title Patient to improve thoracolumbar AROM to [facility] without pain provocation    Status New    Target Date 10/29/20      PT LONG TERM GOAL #5   Title Patient  will demonstrate improved B LE strength to >/= 4+/5 for improved stability and ease of mobility    Status New    Target Date 10/29/20      PT LONG TERM GOAL #6   Title Patient to report ability to perform ADLs, household, and work-related tasks without increased pain    Status New    Target Date 10/29/20                  Plan - 09/17/20 0851    Clinical Impression Statement Allen Sawyer is a 59 y/o male who presents to OP PT for chronic mid and low back pain originating ~1 yr ago and exacerbated by a fall at work in July 2021. He also reports acute L knee  pain stemming from the fall which continues to persist. Pt attributes origin of back pain to decreasing physical fitness and weight gain and denies any known injury beyond exacerbation of pain from fall. He notes back pain initially accompanied by L LE radiculopathy but states this has resolved, and LBP has decreased since performing MD prescribed HEP. Deficits include decreased lumbar ROM due to pain and tightness with greatest restriction in B rotation, significantly limited proximal LE flexibility, increased muscle tension and ttp in thoracolumbar paraspinals, and proximal LE weakness L>R. [place] will benefit from skilled PT to address above deficits to reduce myofascial pain and tightness and improve flexibility and core strength to decrease LBP pain and allow for increased participation in desired activities with decreased pain interference. Initial instruction in stretching HEP provided today with pt noting full resolution of knee pain following performance of stretches.    Personal Factors and Comorbidities Time since onset of injury/illness/exacerbation;Past/Current Experience;Fitness;Profession;Comorbidity 2    Comorbidities L knee pain, HOH    Examination-Activity Limitations Bend;Lift;Squat;Stairs;Transfers    Examination-Participation Restrictions Cleaning;Community Activity;Interpersonal Relationship;Laundry;Meal Prep;Occupation;Yard Work     Stability/Clinical Decision Making Stable/Uncomplicated    OptometristClinical Decision Making Low    Rehab Potential Excellent    PT Frequency 2x / week    PT Duration 6 weeks    PT Treatment/Interventions ADLs/Self Care Home Management;Cryotherapy;Electrical Stimulation;Iontophoresis 4mg /ml Dexamethasone;Moist Heat;Traction;Ultrasound;Gait training;Stair training;Functional mobility training;Therapeutic activities;Therapeutic exercise;Balance training;Neuromuscular re-education;Patient/family education;Manual techniques;Passive range of motion;Dry needling;Taping;Vasopneumatic Device;Spinal Manipulations;Joint Manipulations    PT Next Visit Plan Review initial HEP; progress thoracolumbar and proximal LE flexibility; introduce lumbopelvic strengthening/stabilization; manual therapy and modalities PRN; posture and body mechanics education    Consulted and Agree with Plan of Care Patient           Patient will benefit from skilled therapeutic intervention in order to improve the following deficits and impairments:     Visit Diagnosis: Pain in thoracic spine  Chronic midline low back pain without sciatica  Muscle spasm of back  Muscle weakness (generalized)     Problem List Patient Active Problem List   Diagnosis Date Noted  . Acute midline thoracic back pain 08/19/2020  . Low back pain 08/22/2011    Marry GuanJoAnne M Dream Nodal, PT, MPT 09/17/2020, 3:36 PM  [facility] [place] [address]  [address] [place], [place], [address] Phone: [phone]   Fax:  [phone]  Name: Kristine GarbeJohn Shiffler MRN: [id number] Date of Birth: 04/02/1961

## 2020-09-21 ENCOUNTER — Encounter: Payer: Self-pay | Admitting: Physical Therapy

## 2020-09-21 ENCOUNTER — Telehealth: Payer: Self-pay | Admitting: Family Medicine

## 2020-09-21 ENCOUNTER — Ambulatory Visit: Payer: Managed Care, Other (non HMO) | Admitting: Physical Therapy

## 2020-09-21 ENCOUNTER — Other Ambulatory Visit: Payer: Self-pay

## 2020-09-21 DIAGNOSIS — G8929 Other chronic pain: Secondary | ICD-10-CM

## 2020-09-21 DIAGNOSIS — M545 Low back pain, unspecified: Secondary | ICD-10-CM

## 2020-09-21 DIAGNOSIS — M546 Pain in thoracic spine: Secondary | ICD-10-CM

## 2020-09-21 DIAGNOSIS — M62830 Muscle spasm of back: Secondary | ICD-10-CM

## 2020-09-21 DIAGNOSIS — M6281 Muscle weakness (generalized): Secondary | ICD-10-CM

## 2020-09-21 NOTE — Telephone Encounter (Signed)
Received letter from [employer] short term disability (STD) requesting form completion & OV notes from 10/15 to present date (attaching req'd notes& forwarding form in doctor box for Completion) will fax back to [phone] as soon as completed by provider  --call them w/any questions @ [phone]  --glh

## 2020-09-21 NOTE — Therapy (Signed)
[facility] [address]  [address] [place], [place], [address] Phone: [phone]   Fax:  [phone]  Physical Therapy Treatment  Patient Details  Name: Allen Sawyer MRN: [id number] Date of Birth: 1961/05/06 Referring Provider (PT): Clare Gandy, MD   Encounter Date: 09/21/2020   PT End of Session - 09/21/20 1307    Visit Number 2    Number of Visits 12    Date for PT Re-Evaluation 10/29/20    Authorization Type [employer]    PT Start Time 1307    PT Stop Time 1353    PT Time Calculation (min) 46 min    Activity Tolerance Patient tolerated treatment well    Behavior During Therapy [facility] for tasks assessed/performed           Past Medical History:  Diagnosis Date  . Sleep apnea     Past Surgical History:  Procedure Laterality Date  . APPENDECTOMY      There were no vitals filed for this visit.   Subjective Assessment - 09/21/20 1310    Subjective Pt denies issues with initial HEP, noting stretches seem to help both his back and knee pain. He reports some muscle soreness from "leg day" at the gym yesterday but no pain.    Diagnostic tests 08/18/20 - Thoracic spine x-ray: Mild thoracic spine scoliosis. Diffuse severe multileveldegenerative change again noted. No acute abnormality identified.   07/24/20 - Lumbar x-ray: Diffuse degenerative changes, most notable at L4-L5 and L5-S1.    Patient Stated Goals "to be painfree and get back to working out"    Pain Score 0-No pain    Pain Location Back    Pain Onset --   ~1 yr ago   Pain Score 4    Pain Location Knee    Pain Orientation Left    Pain Descriptors / Indicators Tightness   "stiffness"   Pain Type Chronic pain    Pain Onset --   July 2021 - at time of fal at work                            [facility] Adult PT Treatment/Exercise - 09/21/20 1307      Exercises   Exercises Lumbar      Lumbar Exercises: Stretches   Passive Hamstring Stretch  Left;Right;30 seconds;1 rep    Passive Hamstring Stretch Limitations supine with strap    Single Knee to Chest Stretch Left;Right;30 seconds;1 rep    Single Knee to Chest Stretch Limitations opp LE straight    Lower Trunk Rotation 20 seconds;3 reps    Hip Flexor Stretch Left;Right;30 seconds;1 rep    Hip Flexor Stretch Limitations mod thomas with strap    ITB Stretch Left;30 seconds;1 rep    ITB Stretch Limitations supine cross-body with strap    Piriformis Stretch Left;Right;30 seconds;1 rep    Piriformis Stretch Limitations supine KTOS    Figure 4 Stretch 30 seconds;1 rep;Supine;With overpressure    Figure 4 Stretch Limitations B LE figure-4 to chest    Other Lumbar Stretch Exercise L/R sidelying trunk rotation/open book 5 x 5"      Lumbar Exercises: Aerobic   Recumbent Bike L2 x 6 min      Lumbar Exercises: Standing   Functional Squats 10 reps;5 seconds   2 sets   Functional Squats Limitations supported squat with cues to avoid knees fwd of toes & keep torso upright; 1st set -  TRX, 2nd set - counter squat      Lumbar Exercises: Supine   Ab Set 5 reps;5 seconds    Pelvic Tilt 5 reps;5 seconds    Pelvic Tilt Limitations posterior    Clam 10 reps;5 seconds    Clam Limitations TrA + green TB alt hip ABD/ER    Bent Knee Raise 10 reps;3 seconds    Bent Knee Raise Limitations brace marching    Bridge 10 reps;5 seconds    Bridge with clamshell 10 reps;5 seconds    Bridge with Ball Squeeze Limitations + green TB hip ABD isometric                  PT Education - 09/21/20 1353    Education Details HEP update - basic lumbopelvic strengthening    Person(s) Educated Patient    Methods Explanation;Demonstration;Verbal cues;Handout    Comprehension Verbalized understanding;Verbal cues required;Returned demonstration            PT Short Term Goals - 09/21/20 1314      PT SHORT TERM GOAL #1   Title Patient will be independent with initial HEP    Status On-going    Target  Date 10/01/20      PT SHORT TERM GOAL #2   Title Patient will verbalize/demonstrate understanding of neutral spine posture and proper body mechanics to reduce strain on thoracolumbar spine    Status On-going    Target Date 10/08/20             PT Long Term Goals - 09/21/20 1314      PT LONG TERM GOAL #1   Title Patient will be independent with ongoing/advanced HEP +/- gym program for self-management at home    Status On-going    Target Date 10/29/20      PT LONG TERM GOAL #2   Title Patient to demonstrate appropriate posture and body mechanics needed for daily activities including return to work in January    Status On-going    Target Date 10/29/20      PT LONG TERM GOAL #3   Title Patient to report back and L knee pain reduction in frequency and intensity by >/= 50-75%    Status On-going    Target Date 10/29/20      PT LONG TERM GOAL #4   Title Patient to improve thoracolumbar AROM to [facility] without pain provocation    Status On-going    Target Date 10/29/20      PT LONG TERM GOAL #5   Title Patient will demonstrate improved B LE strength to >/= 4+/5 for improved stability and ease of mobility    Status On-going    Target Date 10/29/20      PT LONG TERM GOAL #6   Title Patient to report ability to perform ADLs, household, and work-related tasks without increased pain    Status On-going    Target Date 10/29/20                 Plan - 09/21/20 1315    Clinical Impression Statement Stevenson report good benefit from initial HEP stretches, already noting reduction in his mid and low back pain as well as L knee pain - only c/o today is L knee stiffness. Review of initial HEP requiring up to mod cues for positioning/technique but pt noting feeling much looser with resolution on L knee stiffness following stretches. Introduced lumbopelvic strengthening, targeting good core/abdominal recruitment for lumbopelvic support - pt reporting good muscle activation/fatigue  with exercises  but no increased pain, therefore HEP updated to include basic lumbopelvic strengthening.    Comorbidities L knee pain, HOH    Rehab Potential Excellent    PT Frequency 2x / week    PT Duration 6 weeks    PT Treatment/Interventions ADLs/Self Care Home Management;Cryotherapy;Electrical Stimulation;Iontophoresis 4mg /ml Dexamethasone;Moist Heat;Traction;Ultrasound;Gait training;Stair training;Functional mobility training;Therapeutic activities;Therapeutic exercise;Balance training;Neuromuscular re-education;Patient/family education;Manual techniques;Passive range of motion;Dry needling;Taping;Vasopneumatic Device;Spinal Manipulations;Joint Manipulations    PT Next Visit Plan posture and body mechanics education; progress thoracolumbar and proximal LE flexibility; introduce lumbopelvic strengthening/stabilization; manual therapy and modalities PRN    Consulted and Agree with Plan of Care Patient           Patient will benefit from skilled therapeutic intervention in order to improve the following deficits and impairments:  Decreased activity tolerance, Decreased knowledge of precautions, Decreased range of motion, Decreased strength, Difficulty walking, Increased fascial restricitons, Increased muscle spasms, Impaired perceived functional ability, Impaired flexibility, Improper body mechanics, Postural dysfunction, Pain  Visit Diagnosis: Pain in thoracic spine  Chronic midline low back pain without sciatica  Muscle spasm of back  Muscle weakness (generalized)     Problem List Patient Active Problem List   Diagnosis Date Noted  . Acute midline thoracic back pain 08/19/2020  . Low back pain 08/22/2011    08/24/2011, PT, MPT 09/21/2020, 2:13 PM  [facility] [address]  [address] [place], [place], [place] Phone: [phone]   Fax:  [phone]  Name: Tajai Ihde MRN: Kristine Garbe Date of Birth: 09-Mar-1961

## 2020-09-21 NOTE — Patient Instructions (Signed)
    Home exercise program created by Reema Chick, PT.  For questions, please contact Shantasia Hunnell via phone at [phone] or email at [email or web address]  [facility] [place] [address]  [address] [place], [place], [address] Phone: [phone]   Fax:  [phone]    

## 2020-09-22 ENCOUNTER — Ambulatory Visit (INDEPENDENT_AMBULATORY_CARE_PROVIDER_SITE_OTHER): Payer: Managed Care, Other (non HMO) | Admitting: Family Medicine

## 2020-09-22 DIAGNOSIS — M545 Low back pain, unspecified: Secondary | ICD-10-CM | POA: Diagnosis not present

## 2020-09-22 NOTE — Assessment & Plan Note (Signed)
Has been stable. -Counseled on home exercise therapy and supportive care. -Has physical therapy scheduled. -Completed paperwork.

## 2020-09-22 NOTE — Progress Notes (Signed)
  Allen Sawyer - 59 y.o. male MRN [id number]  Date of birth: 1961/10/02  SUBJECTIVE:  Including CC & ROS.  No chief complaint on file.   Allen Sawyer is a 59 y.o. male that is following up for his ongoing back pain.  He has scheduled to be seen by physical therapy.  Symptoms are very minimal.  He has paperwork that needs to be completed.   Review of Systems See HPI   HISTORY: Past Medical, Surgical, Social, and Family History Reviewed & Updated per EMR.   Pertinent Historical Findings include:  Past Medical History:  Diagnosis Date  . Sleep apnea     Past Surgical History:  Procedure Laterality Date  . APPENDECTOMY      Family History  Problem Relation Age of Onset  . Hyperlipidemia Mother   . Hypertension Mother   . Hyperlipidemia Father   . Hypertension Father   . Heart attack Father   . Hyperlipidemia Sister   . Hypertension Sister   . Hypertension Brother   . Hyperlipidemia Brother   . Diabetes Brother   . Heart attack Paternal Uncle   . Sudden death Neg Hx     Social History   Socioeconomic History  . Marital status: Married    Spouse name: Not on file  . Number of children: Not on file  . Years of education: Not on file  . Highest education level: Not on file  Occupational History  . Not on file  Tobacco Use  . Smoking status: Former Games developer  . Smokeless tobacco: Never Used  Substance and Sexual Activity  . Alcohol use: Yes    Comment: occ  . Drug use: No  . Sexual activity: Not on file  Other Topics Concern  . Not on file  Social History Narrative  . Not on file   Social Determinants of Health   Financial Resource Strain:   . Difficulty of Paying Living Expenses: Not on file  Food Insecurity:   . Worried About Programme researcher, broadcasting/film/video in the Last Year: Not on file  . Ran Out of Food in the Last Year: Not on file  Transportation Needs:   . Lack of Transportation (Medical): Not on file  . Lack of Transportation (Non-Medical): Not on file  Physical  Activity:   . Days of Exercise per Week: Not on file  . Minutes of Exercise per Session: Not on file  Stress:   . Feeling of Stress : Not on file  Social Connections:   . Frequency of Communication with Friends and Family: Not on file  . Frequency of Social Gatherings with Friends and Family: Not on file  . Attends Religious Services: Not on file  . Active Member of Clubs or Organizations: Not on file  . Attends Banker Meetings: Not on file  . Marital Status: Not on file  Intimate Partner Violence:   . Fear of Current or Ex-Partner: Not on file  . Emotionally Abused: Not on file  . Physically Abused: Not on file  . Sexually Abused: Not on file     PHYSICAL EXAM:  VS: There were no vitals taken for this visit. Physical Exam Gen: NAD, alert, cooperative with exam, well-appearing    ASSESSMENT & PLAN:   Low back pain Has been stable. -Counseled on home exercise therapy and supportive care. -Has physical therapy scheduled. -Completed paperwork.

## 2020-09-23 ENCOUNTER — Ambulatory Visit: Payer: Managed Care, Other (non HMO)

## 2020-10-05 ENCOUNTER — Other Ambulatory Visit: Payer: Self-pay

## 2020-10-05 ENCOUNTER — Ambulatory Visit: Payer: Managed Care, Other (non HMO) | Attending: Family Medicine

## 2020-10-05 DIAGNOSIS — M545 Low back pain, unspecified: Secondary | ICD-10-CM

## 2020-10-05 DIAGNOSIS — M6281 Muscle weakness (generalized): Secondary | ICD-10-CM | POA: Diagnosis present

## 2020-10-05 DIAGNOSIS — M62830 Muscle spasm of back: Secondary | ICD-10-CM | POA: Diagnosis present

## 2020-10-05 DIAGNOSIS — G8929 Other chronic pain: Secondary | ICD-10-CM | POA: Insufficient documentation

## 2020-10-05 DIAGNOSIS — M546 Pain in thoracic spine: Secondary | ICD-10-CM | POA: Diagnosis present

## 2020-10-05 NOTE — Therapy (Addendum)
[facility] [place] [address]  [place], [place], [address] Phone: [phone]   Fax:  [phone]  Physical Therapy Treatment / Discharge Summary  Patient Details  Name: Allen Sawyer MRN: [id number] Date of Birth: 1961/06/05 Referring Provider (PT): Clearance Coots, MD   Encounter Date: 10/05/2020   PT End of Session - 10/05/20 1122    Visit Number 3    Number of Visits 12    Date for PT Re-Evaluation 10/29/20    Authorization Type Cigna    PT Start Time 1109    PT Stop Time 1155    PT Time Calculation (min) 46 min    Activity Tolerance Patient tolerated treatment well    Behavior During Therapy [facility] for tasks assessed/performed           Past Medical History:  Diagnosis Date  . Sleep apnea     Past Surgical History:  Procedure Laterality Date  . APPENDECTOMY      There were no vitals filed for this visit.   Subjective Assessment - 10/05/20 1113    Subjective Pt. doing ok.  Starting work back on 11/18/20.  Notes that he has not been having back pain this past week.    Diagnostic tests 08/18/20 - Thoracic spine x-ray: Mild thoracic spine scoliosis. Diffuse severe multileveldegenerative change again noted. No acute abnormality identified.   07/24/20 - Lumbar x-ray: Diffuse degenerative changes, most notable at L4-L5 and L5-S1.    Patient Stated Goals "to be painfree and get back to working out"    Currently in Pain? No/denies    Pain Score 0-No pain    Pain Location Back    Pain Orientation Mid;Lower    Pain Descriptors / Indicators Spasm    Pain Type Chronic pain                             [facility] Adult PT Treatment/Exercise - 10/05/20 0001      Self-Care   Self-Care Other Self-Care Comments;Posture    Posture Posture instruction to reduce lumbar strain with sitting and standing     Other Self-Care Comments  instruction in proper body mechanics with daily activities       Lumbar  Exercises: Stretches   Lower Trunk Rotation Limitations 10 x 5"    Piriformis Stretch Left;Right;30 seconds;1 rep    Piriformis Stretch Limitations supine KTOS    Figure 4 Stretch 30 seconds;1 rep;Supine;With overpressure    Figure 4 Stretch Limitations B LE figure-4 to chest      Lumbar Exercises: Aerobic   Recumbent Bike L6 x 6 min      Lumbar Exercises: Machines for Strengthening   Other Lumbar Machine Exercise B low row, mid 25# x 3" x 15 reps    reviewed as pt. performing this activity at the gym      Lumbar Exercises: Supine   Dead Bug 10 reps;3 seconds    Dead Bug Limitations LE only first set; 2nd set with LE/UE    Bridge 10 reps;5 seconds    Bridge with clamshell 10 reps;5 seconds   cues for increased ROM   Bridge with [employer] Limitations + green TB hip ABD isometric                    PT Short Term Goals - 10/05/20 1123      PT SHORT TERM GOAL #1   Title  Patient will be independent with initial HEP    Status Achieved    Target Date 10/01/20      PT SHORT TERM GOAL #2   Title Patient will verbalize/demonstrate understanding of neutral spine posture and proper body mechanics to reduce strain on thoracolumbar spine    Status Achieved    Target Date 10/08/20             PT Long Term Goals - 09/21/20 1314      PT LONG TERM GOAL #1   Title Patient will be independent with ongoing/advanced HEP +/- gym program for self-management at home    Status On-going    Target Date 10/29/20      PT LONG TERM GOAL #2   Title Patient to demonstrate appropriate posture and body mechanics needed for daily activities including return to work in January    Status On-going    Target Date 10/29/20      PT LONG TERM GOAL #3   Title Patient to report back and L knee pain reduction in frequency and intensity by >/= 50-75%    Status On-going    Target Date 10/29/20      PT LONG TERM GOAL #4   Title Patient to improve thoracolumbar AROM to [facility] without pain  provocation    Status On-going    Target Date 10/29/20      PT LONG TERM GOAL #5   Title Patient will demonstrate improved B LE strength to >/= 4+/5 for improved stability and ease of mobility    Status On-going    Target Date 10/29/20      PT LONG TERM GOAL #6   Title Patient to report ability to perform ADLs, household, and work-related tasks without increased pain    Status On-going    Target Date 10/29/20                 Plan - 10/05/20 1123    Clinical Impression Statement Pt. noting "98% improvement in my back" since starting therapy.  Returns to work on 11/18/20.  Pt. reporting no issues with the initial HEP.  STG #1 met.  Verbalizing understanding of posture and body mechanics instruction today to reduce lumbar strain.  Has adjusted his posture and has been correcting himself from slumping while sitting.  STG #2 met.    Comorbidities L knee pain, HOH    Rehab Potential Excellent    PT Frequency 2x / week    PT Duration 6 weeks    PT Treatment/Interventions ADLs/Self Care Home Management;Cryotherapy;Electrical Stimulation;Iontophoresis 2m/ml Dexamethasone;Moist Heat;Traction;Ultrasound;Gait training;Stair training;Functional mobility training;Therapeutic activities;Therapeutic exercise;Balance training;Neuromuscular re-education;Patient/family education;Manual techniques;Passive range of motion;Dry needling;Taping;Vasopneumatic Device;Spinal Manipulations;Joint Manipulations    PT Next Visit Plan Progress thoracolumbar and proximal LE flexibility; lumbopelvic strengthening/stabilization; manual therapy and modalities PRN    Consulted and Agree with Plan of Care Patient           Patient will benefit from skilled therapeutic intervention in order to improve the following deficits and impairments:  Decreased activity tolerance, Decreased knowledge of precautions, Decreased range of motion, Decreased strength, Difficulty walking, Increased fascial restricitons, Increased  muscle spasms, Impaired perceived functional ability, Impaired flexibility, Improper body mechanics, Postural dysfunction, Pain  Visit Diagnosis: Pain in thoracic spine  Chronic midline low back pain without sciatica  Muscle spasm of back  Muscle weakness (generalized)     Problem List Patient Active Problem List   Diagnosis Date Noted  . Acute midline thoracic back pain 08/19/2020  .  Low back pain 08/22/2011    Bess Harvest, PTA 10/05/20 12:09 PM   [facility] [place] [address]  [place], [place], [address] Phone: [phone]   Fax:  [phone]  Name: Virginia Francisco MRN: [id number] Date of Birth: 04-11-1961   PHYSICAL THERAPY DISCHARGE SUMMARY  Visits from Start of Care: 3  Current functional level related to goals / functional outcomes:   Refer to above clinical impression for status as of last visit on 10/05/2020. Patient no showed for next 2 scheduled visits and failed to respond to calls to reschedule. He has not returned to PT in >30 days, therefore will proceed with discharge from PT for this episode.   Remaining deficits:   Unable to formally assess due to failure to return to PT.   Education / Equipment:   HEP, Chief Financial Officer  Plan: Patient agrees to discharge.  Patient goals were partially met. Patient is being discharged due to not returning since the last visit.  ?????     Percival Spanish, PT, MPT 11/05/20, 3:43 PM  [facility] [address]  [place], [place], [address] Phone: [phone]   Fax:  [phone]

## 2020-10-05 NOTE — Patient Instructions (Signed)

## 2020-10-07 ENCOUNTER — Ambulatory Visit: Payer: Managed Care, Other (non HMO)

## 2020-10-19 ENCOUNTER — Encounter: Payer: Self-pay | Admitting: Family Medicine

## 2020-11-02 ENCOUNTER — Other Ambulatory Visit: Payer: Self-pay

## 2020-11-02 ENCOUNTER — Telehealth: Payer: Self-pay | Admitting: Family Medicine

## 2020-11-02 ENCOUNTER — Ambulatory Visit (INDEPENDENT_AMBULATORY_CARE_PROVIDER_SITE_OTHER): Payer: Managed Care, Other (non HMO) | Admitting: Family Medicine

## 2020-11-02 DIAGNOSIS — M545 Low back pain, unspecified: Secondary | ICD-10-CM

## 2020-11-02 NOTE — Telephone Encounter (Signed)
Completed paperwork.   Myra Rude, MD [facility] 11/02/2020, 3:18 PM

## 2020-11-02 NOTE — Assessment & Plan Note (Signed)
Has had improvement with his symptoms. Reports no pain today.  - counseled on home exercise therapy  - work note provided  - f/u PRN.

## 2020-11-02 NOTE — Telephone Encounter (Signed)
Pt came back after OV,states his Emp denied his WC clm & they need provider to fill out forms & return so they can reinstate his medical insurance which termed while he was out of work(not making premium pmts)  --Placing forms in provider tray  -glh

## 2020-11-02 NOTE — Patient Instructions (Signed)
Good to see you Please continue the exercises   Please send me a message in MyChart with any questions or updates.  Please see us back as needed.   --Dr. Taline Nass  

## 2020-11-02 NOTE — Progress Notes (Signed)
  Allen Sawyer - 60 y.o. male MRN [id number]  Date of birth: 02/13/1961  SUBJECTIVE:  Including CC & ROS.  No chief complaint on file.   Allen Sawyer is a 60 y.o. male that is following up for her back pain. Has has completed his physical therapy. He has no pain today.    Review of Systems See HPI   HISTORY: Past Medical, Surgical, Social, and Family History Reviewed & Updated per EMR.   Pertinent Historical Findings include:  Past Medical History:  Diagnosis Date  . Sleep apnea     Past Surgical History:  Procedure Laterality Date  . APPENDECTOMY      Family History  Problem Relation Age of Onset  . Hyperlipidemia Mother   . Hypertension Mother   . Hyperlipidemia Father   . Hypertension Father   . Heart attack Father   . Hyperlipidemia Sister   . Hypertension Sister   . Hypertension Brother   . Hyperlipidemia Brother   . Diabetes Brother   . Heart attack Paternal Uncle   . Sudden death Neg Hx     Social History   Socioeconomic History  . Marital status: Married    Spouse name: Not on file  . Number of children: Not on file  . Years of education: Not on file  . Highest education level: Not on file  Occupational History  . Not on file  Tobacco Use  . Smoking status: Former Games developer  . Smokeless tobacco: Never Used  Substance and Sexual Activity  . Alcohol use: Yes    Comment: occ  . Drug use: No  . Sexual activity: Not on file  Other Topics Concern  . Not on file  Social History Narrative  . Not on file   Social Determinants of Health   Financial Resource Strain: Not on file  Food Insecurity: Not on file  Transportation Needs: Not on file  Physical Activity: Not on file  Stress: Not on file  Social Connections: Not on file  Intimate Partner Violence: Not on file     PHYSICAL EXAM:  VS: Ht 6\' 2"  (1.88 m)   Wt 220 lb (99.8 kg)   BMI 28.25 kg/m  Physical Exam Gen: NAD, alert, cooperative with exam, well-appearing   ASSESSMENT & PLAN:   Low  back pain Has had improvement with his symptoms. Reports no pain today.  - counseled on home exercise therapy  - work note provided  - f/u PRN.

## 2020-11-03 ENCOUNTER — Telehealth: Payer: Self-pay | Admitting: Family Medicine

## 2020-11-03 NOTE — Telephone Encounter (Signed)
Pt left message that he needed a RTW letter & FMLA forms completed to return to work.   --Called pt advised Dr. Jordan Likes gave him the RTW letter Monday w/his after visit summary, also that his FMLA forms are complete & ready for him to pick up.  Pt coming today for forms & will look at home for RTW letter.  -glh

## 2020-11-04 ENCOUNTER — Encounter: Payer: Managed Care, Other (non HMO) | Admitting: Gastroenterology

## 2021-03-08 ENCOUNTER — Other Ambulatory Visit: Payer: Self-pay

## 2021-03-08 ENCOUNTER — Ambulatory Visit (INDEPENDENT_AMBULATORY_CARE_PROVIDER_SITE_OTHER): Payer: Self-pay | Admitting: Family Medicine

## 2021-03-08 ENCOUNTER — Encounter: Payer: Self-pay | Admitting: Family Medicine

## 2021-03-08 VITALS — BP 152/92 | HR 70 | Temp 98.3°F | Ht 74.0 in | Wt 233.1 lb

## 2021-03-08 DIAGNOSIS — F4321 Adjustment disorder with depressed mood: Secondary | ICD-10-CM

## 2021-03-08 DIAGNOSIS — J302 Other seasonal allergic rhinitis: Secondary | ICD-10-CM

## 2021-03-08 DIAGNOSIS — F1010 Alcohol abuse, uncomplicated: Secondary | ICD-10-CM

## 2021-03-08 DIAGNOSIS — R030 Elevated blood-pressure reading, without diagnosis of hypertension: Secondary | ICD-10-CM

## 2021-03-08 MED ORDER — LEVOCETIRIZINE DIHYDROCHLORIDE 5 MG PO TABS: 5.0000 mg | ORAL_TABLET | Freq: Every evening | ORAL | 2 refills | Status: DC

## 2021-03-08 MED ORDER — FLUTICASONE PROPIONATE 50 MCG/ACT NA SUSP: 2.0000 | Freq: Every day | NASAL | 6 refills | Status: DC

## 2021-03-08 NOTE — Progress Notes (Signed)
Chief Complaint  Patient presents with  . Sore Throat  . Depression    Allen Sawyer here for URI complaints.  Duration: 3 days  Associated symptoms: sinus congestion, rhinorrhea, itchy watery eyes, ear fullness and swollen LN on L portion of neck Denies: sinus pain, ear pain, ear drainage, sore throat, wheezing, shortness of breath, myalgia and fevers, coughing Treatment to date: NSAIDs Sick contacts: No   Alcohol use Pt has been drinking 1/2 a fifth nightly for the last 2 months.  It has caused his blood pressure to increase and he has gained weight.  He has been drinking to cope with depression as 4 close family members/friends passed away.  He is not following with a counselor or psychologist and is not interested in any medication.  No homicidal or suicidal ideation.  He is not self-medicating with anything else.  Past Medical History:  Diagnosis Date  . Sleep apnea     BP (!) 152/92 (BP Location: Left Arm, Patient Position: Sitting, Cuff Size: Large)   Pulse 70   Temp 98.3 F (36.8 C) (Oral)   Ht 6\' 2"  (1.88 m)   Wt 233 lb 2 oz (105.7 kg)   SpO2 95%   BMI 29.93 kg/m  General: Awake, alert, appears stated age HEENT: AT, [place], ears patent b/l and TM's neg, nares patent w/o discharge, pharynx pink and without exudates, MMM Neck: No masses or asymmetry Heart: RRR Lungs: CTAB, no accessory muscle use Psych: Age appropriate judgment and insight, normal mood and affect  Seasonal allergies - Plan: levocetirizine (XYZAL) 5 MG tablet, fluticasone (FLONASE) 50 MCG/ACT nasal spray  Alcohol abuse, daily use  Situational depression  Elevated blood pressure reading  1. Start Xyzal and intranasal corticosteroid.  This is likely the reason for his submandibular gland enlargement/swelling. 2/3.  She had decreased LOC intake by half and again by half in 2-week intervals.  Counseling information provided.  Declined medication at this time. 4. Monitor blood pressure at home.  If  continues to be elevated even after coming off of alcohol, he will let me know. F/u prn. If starting to experience fevers, shaking, or shortness of breath, seek immediate care. Pt voiced understanding and agreement to the plan.  [place], DO 03/08/21 10:55 AM

## 2021-03-08 NOTE — Patient Instructions (Addendum)
Claritin (loratadine), Allegra (fexofenadine), Zyrtec (cetirizine) which is also equivalent to Xyzal (levocetirizine); these are listed in order from weakest to strongest. Generic, and therefore cheaper, options are in the parentheses.   Flonase (fluticasone); nasal spray that is over the counter. 2 sprays each nostril, once daily. Aim towards the same side eye when you spray.  There are available OTC, and the generic versions, which may be cheaper, are in parentheses. Show this to a pharmacist if you have trouble finding any of these items.  Ibuprofen 400-600 mg (2-3 over the counter strength tabs) every 6 hours as needed for pain.  Start drinking 1/4 of the fifth of vodka for the next few weeks. After that, cut another 1/2 off of that quantity for the final 2 weeks and then stop. Let me know if you are having issues.  Please consider counseling. Contact [phone] to schedule an appointment or inquire about cost/insurance coverage.  Check your blood pressures 2-3 times per week, alternating the time of day you check it. If it is high, considering waiting 1-2 minutes and rechecking. If it gets higher, your anxiety is likely creeping up and we should avoid rechecking.   Let us know if you need anything.

## 2021-05-15 ENCOUNTER — Emergency Department (HOSPITAL_BASED_OUTPATIENT_CLINIC_OR_DEPARTMENT_OTHER): Payer: 59

## 2021-05-15 ENCOUNTER — Encounter (HOSPITAL_BASED_OUTPATIENT_CLINIC_OR_DEPARTMENT_OTHER): Payer: Self-pay

## 2021-05-15 ENCOUNTER — Emergency Department (HOSPITAL_BASED_OUTPATIENT_CLINIC_OR_DEPARTMENT_OTHER)
Admission: EM | Admit: 2021-05-15 | Discharge: 2021-05-15 | Disposition: A | Discharge status: Home / Self Care | Payer: 59 | Attending: Emergency Medicine | Admitting: Emergency Medicine

## 2021-05-15 ENCOUNTER — Other Ambulatory Visit: Payer: Self-pay

## 2021-05-15 DIAGNOSIS — R059 Cough, unspecified: Secondary | ICD-10-CM

## 2021-05-15 DIAGNOSIS — Z20822 Contact with and (suspected) exposure to covid-19: Secondary | ICD-10-CM | POA: Insufficient documentation

## 2021-05-15 DIAGNOSIS — B349 Viral infection, unspecified: Secondary | ICD-10-CM

## 2021-05-15 DIAGNOSIS — R5383 Other fatigue: Secondary | ICD-10-CM

## 2021-05-15 DIAGNOSIS — Z87891 Personal history of nicotine dependence: Secondary | ICD-10-CM | POA: Insufficient documentation

## 2021-05-15 LAB — RESP PANEL BY RT-PCR (FLU A&B, COVID) ARPGX2
Influenza A by PCR: NEGATIVE
Influenza B by PCR: NEGATIVE
SARS Coronavirus 2 by RT PCR: NEGATIVE

## 2021-05-15 MED ORDER — METHOCARBAMOL 500 MG PO TABS: 500.0000 mg | ORAL_TABLET | Freq: Two times a day (BID) | ORAL | 0 refills | Status: DC

## 2021-05-15 MED ORDER — BENZONATATE 100 MG PO CAPS: 100.0000 mg | ORAL_CAPSULE | Freq: Three times a day (TID) | ORAL | 0 refills | Status: DC

## 2021-05-15 MED ORDER — ONDANSETRON 4 MG PO TBDP: 4.0000 mg | ORAL_TABLET | Freq: Three times a day (TID) | ORAL | 0 refills | Status: DC | PRN

## 2021-05-15 NOTE — ED Provider Notes (Signed)
[facility] EMERGENCY DEPARTMENT Provider Note   CSN: [id number] Arrival date & time: 05/15/21  1008     History Chief Complaint  Patient presents with   Headache    Allen Sawyer is a 60 y.o. male.  HPI Patient is 60 year old male with past medical history significant for sleep apnea and seasonal allergies  Patient presented to the ER today with sinus congestion, postnasal drainage, swollen lymph nodes in the neck, mild dry cough and fatigue for the past 3 days.  He states he has felt somewhat fatigued over the past week or 2 but noticed yesterday that he had some chills and felt more fatigued.  He was concerned for COVID works in a small work environment with many other workers some with mask some without.  He denies any chest pain or shortness of breath.  No lightheadedness or dizziness.  No vomiting but does endorse some watery diarrhea and nausea over the past 3 days.  No fevers.  Does endorse some occasional chills.  Some diffuse myalgias as well.  Denies any neck pain or stiffness.  States that his back has some swollen lymph nodes in them.  No other significant associated symptoms.  No aggravating mitigating factors.    Past Medical History:  Diagnosis Date   Sleep apnea     Patient Active Problem List   Diagnosis Date Noted   Seasonal allergies 03/08/2021   Acute midline thoracic back pain 08/19/2020   Low back pain 08/22/2011    Past Surgical History:  Procedure Laterality Date   APPENDECTOMY         Family History  Problem Relation Age of Onset   Hyperlipidemia Mother    Hypertension Mother    Hyperlipidemia Father    Hypertension Father    Heart attack Father    Hyperlipidemia Sister    Hypertension Sister    Hypertension Brother    Hyperlipidemia Brother    Diabetes Brother    Heart attack Paternal Uncle    Sudden death Neg Hx     Social History   Tobacco Use   Smoking status: Former   Smokeless tobacco: Never  Substance Use  Topics   Alcohol use: Yes    Comment: occ   Drug use: No    Home Medications Prior to Admission medications   Medication Sig Start Date End Date Taking? Authorizing Provider  benzonatate (TESSALON) 100 MG capsule Take 1 capsule (100 mg total) by mouth every 8 (eight) hours. 05/15/21  Yes Kandis Henry S, PA  methocarbamol (ROBAXIN) 500 MG tablet Take 1 tablet (500 mg total) by mouth 2 (two) times daily. 05/15/21  Yes Jamiaya Bina S, PA  ondansetron (ZOFRAN ODT) 4 MG disintegrating tablet Take 1 tablet (4 mg total) by mouth every 8 (eight) hours as needed for nausea or vomiting. 05/15/21  Yes Cuahutemoc Attar S, PA  fluticasone (FLONASE) 50 MCG/ACT nasal spray Place 2 sprays into both nostrils daily. 03/08/21   Sharlene Dory, DO  levocetirizine (XYZAL) 5 MG tablet Take 1 tablet (5 mg total) by mouth every evening. 03/08/21   Sharlene Dory, DO    Allergies    Patient has no allergy information on record.  Review of Systems   Review of Systems  Constitutional:  Positive for chills and fatigue. Negative for fever.  HENT:  Positive for congestion.   Eyes:  Negative for pain.  Respiratory:  Positive for cough. Negative for shortness of breath.   Cardiovascular:  Negative for chest  pain and leg swelling.  Gastrointestinal:  Positive for diarrhea and nausea. Negative for abdominal pain, constipation and vomiting.  Genitourinary:  Negative for dysuria.  Musculoskeletal:  Positive for myalgias.  Skin:  Negative for rash.  Neurological:  Negative for dizziness and headaches.   Physical Exam Updated Vital Signs BP (!) 155/89 (BP Location: Right Arm)   Pulse 71   Temp 98.1 F (36.7 C) (Oral)   Resp 16   Ht 6\' 2"  (1.88 m)   Wt 106.6 kg   SpO2 99%   BMI 30.17 kg/m   Physical Exam Vitals and nursing note reviewed.  Constitutional:      General: He is not in acute distress.    Comments: Pleasant well-appearing 60 year old.  In no acute distress.  Sitting comfortably in  bed.  Able answer questions appropriately follow commands. No increased work of breathing. Speaking in full sentences.  HENT:     Head: Normocephalic and atraumatic.     Nose: Nose normal.     Mouth/Throat:     Comments: Posterior pharynx with moist mucosa, no erythema, some nasal drainage and posterior pharynx. Eyes:     General: No scleral icterus. Cardiovascular:     Rate and Rhythm: Normal rate and regular rhythm.     Pulses: Normal pulses.     Heart sounds: Normal heart sounds.  Pulmonary:     Effort: Pulmonary effort is normal. No respiratory distress.     Breath sounds: No wheezing.  Abdominal:     General: There is no distension.     Palpations: Abdomen is soft.     Tenderness: There is no abdominal tenderness. There is no guarding.  Musculoskeletal:     Cervical back: Normal range of motion.     Right lower leg: No edema.     Left lower leg: No edema.  Skin:    General: Skin is warm and dry.     Capillary Refill: Capillary refill takes less than 2 seconds.  Neurological:     Mental Status: He is alert. Mental status is at baseline.     Comments: Good strength in all 4 extremities.  Walks without difficulty.  ANO x3.  Psychiatric:        Mood and Affect: Mood normal.        Behavior: Behavior normal.    ED Results / Procedures / Treatments   Labs (all labs ordered are listed, but only abnormal results are displayed) Labs Reviewed  RESP PANEL BY RT-PCR (FLU A&B, COVID) [id number]    EKG None  Radiology No results found.  Procedures Procedures   Medications Ordered in ED Medications - No data to display  ED Course  I have reviewed the triage vital signs and the nursing notes.  Pertinent labs & imaging results that were available during my care of the patient were reviewed by me and considered in my medical decision making (see chart for details).    MDM Rules/Calculators/A&P                          Patient is 60 year old male presenting today with  symptoms consistent with viral process.  Specifics in HPI.  Symptoms started 3 days ago although he has had some mild fatigue over the past couple weeks.  Seems that he is working quite long hours also in a dusty environment.  Physical exam is unremarkable  Will obtain x-ray to rule out pneumonia given somewhat prolonged fatigue however cough  for only about 3 days.  Suspect COVID or other viral process.  Will treat conservatively.  X-ray without abnormality.   Discharged home with Zofran, benzonatate, Robaxin.  Patient agreeable to plan.   Return precautions given.   Allen Sawyer was evaluated in Emergency Department on 05/15/2021 for the symptoms described in the history of present illness. He was evaluated in the context of the global COVID-19 pandemic, which necessitated consideration that the patient might be at risk for infection with the SARS-CoV-2 virus that causes COVID-19. Institutional protocols and algorithms that pertain to the evaluation of patients at risk for COVID-19 are in a state of rapid change based on information released by regulatory bodies including the CDC and federal and state organizations. These policies and algorithms were followed during the patient's care in the ED.   Final Clinical Impression(s) / ED Diagnoses Final diagnoses:  Cough  Fatigue, unspecified type  Viral illness  Suspected COVID-19 virus infection    Rx / DC Orders ED Discharge Orders          Ordered    ondansetron (ZOFRAN ODT) 4 MG disintegrating tablet  Every 8 hours PRN        05/15/21 1108    benzonatate (TESSALON) 100 MG capsule  Every 8 hours        05/15/21 1108    methocarbamol (ROBAXIN) 500 MG tablet  2 times daily        05/15/21 1108             Solon Augusta Nelson, [place] 05/15/21 1539    Gwyneth Sprout, MD 05/16/21 2221

## 2021-05-15 NOTE — ED Notes (Signed)
Nasal swab collected

## 2021-05-15 NOTE — Discharge Instructions (Addendum)
Your COVID test is pending at this time.  In the meantime please take Tylenol and ibuprofen as discussed below, use benzonatate as needed for cough and Zofran as needed for nausea this will also help with the diarrhea.  Bland foods including toast, applesauce, bananas, rice, rice cakes, etc. can be helpful for decreasing diarrhea as well.  May also use loperamide over-the-counter.  Your xray was without any evidence of pneumonia or other concerning finding.   I have also given you a muscle relaxer to use as needed for aches at night -- it causes drowsiness.   Please use Tylenol or ibuprofen for pain.  You may use 600 mg ibuprofen every 6 hours or 1000 mg of Tylenol every 6 hours.  You may choose to alternate between the 2.  This would be most effective.  Not to exceed 4 g of Tylenol within 24 hours.  Not to exceed 3200 mg ibuprofen 24 hours.   Please follow-up with your primary care doctor if your symptoms continue.

## 2021-05-15 NOTE — ED Triage Notes (Signed)
Seasonal allergy symptoms, Head congestion, HA, Post nasal drainage, lymph nodes enlarged.  Nonproductive mild cough.  Wants to rule out COVID

## 2021-06-21 ENCOUNTER — Other Ambulatory Visit: Payer: Self-pay

## 2021-06-21 ENCOUNTER — Ambulatory Visit
Admission: EM | Admit: 2021-06-21 | Discharge: 2021-06-21 | Disposition: A | Discharge status: Home / Self Care | Payer: 59 | Attending: Emergency Medicine | Admitting: Emergency Medicine

## 2021-06-21 DIAGNOSIS — J069 Acute upper respiratory infection, unspecified: Secondary | ICD-10-CM

## 2021-06-21 MED ORDER — AZITHROMYCIN 250 MG PO TABS: 250.0000 mg | ORAL_TABLET | Freq: Every day | ORAL | 0 refills | Status: DC

## 2021-06-21 MED ORDER — BENZONATATE 200 MG PO CAPS: 200.0000 mg | ORAL_CAPSULE | Freq: Three times a day (TID) | ORAL | 0 refills | Status: AC | PRN

## 2021-06-21 MED ORDER — AZITHROMYCIN 250 MG PO TABS: ORAL_TABLET | ORAL | 0 refills | Status: DC

## 2021-06-21 NOTE — ED Provider Notes (Signed)
[facility]    CSN: [id number] Arrival date & time: 06/21/21  1031      History   Chief Complaint Chief Complaint  Patient presents with   Headache    HPI Allen Sawyer is a 60 y.o. male history of allergic rhinitis, presenting today for evaluation of hot and cold chills, cough and congestion.  Reports that approximately 2 weeks ago he began to feel under the weather and was evaluated and screened for COVID, COVID test has been negative.  Reports symptoms were improving, but over the past 4 days has had worsening symptoms again reports postnasal drainage, throat irritation as well as cough and congestion in chest.  Denies any significant sinus symptoms.  Denies known fevers, but reports frequently feeling sweaty and warm.  Reports recent COVID test which was negative.  HPI  Past Medical History:  Diagnosis Date   Sleep apnea     Patient Active Problem List   Diagnosis Date Noted   Seasonal allergies 03/08/2021   Acute midline thoracic back pain 08/19/2020   Low back pain 08/22/2011    Past Surgical History:  Procedure Laterality Date   APPENDECTOMY         Home Medications    Prior to Admission medications   Medication Sig Start Date End Date Taking? Authorizing Provider  benzonatate (TESSALON) 200 MG capsule Take 1 capsule (200 mg total) by mouth 3 (three) times daily as needed for up to 7 days for cough. 06/21/21 06/28/21 Yes Dammon Makarewicz C, PA-C  azithromycin (ZITHROMAX) 250 MG tablet Take first 2 tablets together, then 1 every day until finished. 06/21/21   Trellis Vanoverbeke C, PA-C  fluticasone (FLONASE) 50 MCG/ACT nasal spray Place 2 sprays into both nostrils daily. 03/08/21   Sharlene Dory, DO  levocetirizine (XYZAL) 5 MG tablet Take 1 tablet (5 mg total) by mouth every evening. 03/08/21   Sharlene Dory, DO  methocarbamol (ROBAXIN) 500 MG tablet Take 1 tablet (500 mg total) by mouth 2 (two) times daily. 05/15/21   Gailen Shelter, PA   ondansetron (ZOFRAN ODT) 4 MG disintegrating tablet Take 1 tablet (4 mg total) by mouth every 8 (eight) hours as needed for nausea or vomiting. 05/15/21   Gailen Shelter, PA    Family History Family History  Problem Relation Age of Onset   Hyperlipidemia Mother    Hypertension Mother    Hyperlipidemia Father    Hypertension Father    Heart attack Father    Hyperlipidemia Sister    Hypertension Sister    Hypertension Brother    Hyperlipidemia Brother    Diabetes Brother    Heart attack Paternal Uncle    Sudden death Neg Hx     Social History Social History   Tobacco Use   Smoking status: Former   Smokeless tobacco: Never  Substance Use Topics   Alcohol use: Yes    Comment: occ   Drug use: No     Allergies   Patient has no allergy information on record.   Review of Systems Review of Systems  Constitutional:  Positive for fatigue. Negative for activity change, appetite change, chills and fever.  HENT:  Positive for congestion and rhinorrhea. Negative for ear pain, sore throat and trouble swallowing.   Eyes:  Negative for discharge and redness.  Respiratory:  Positive for cough. Negative for chest tightness and shortness of breath.   Cardiovascular:  Negative for chest pain.  Gastrointestinal:  Negative for abdominal pain, diarrhea, nausea  and vomiting.  Musculoskeletal:  Negative for myalgias.  Skin:  Negative for rash.  Neurological:  Negative for dizziness, light-headedness and headaches.    Physical Exam Triage Vital Signs ED Triage Vitals  Enc Vitals Group     BP 06/21/21 1124 (!) 153/91     Pulse Rate 06/21/21 1124 76     Resp 06/21/21 1124 18     Temp 06/21/21 1124 97.6 F (36.4 C)     Temp Source 06/21/21 1124 Oral     SpO2 06/21/21 1124 97 %     Weight --      Height --      Head Circumference --      Peak Flow --      Pain Score 06/21/21 1123 0     Pain Loc --      Pain Edu? --      Excl. in GC? --    No data found.  Updated Vital  Signs BP (!) 153/91 (BP Location: Right Arm)   Pulse 76   Temp 97.6 F (36.4 C) (Oral)   Resp 18   SpO2 97%   Visual Acuity Right Eye Distance:   Left Eye Distance:   Bilateral Distance:    Right Eye Near:   Left Eye Near:    Bilateral Near:     Physical Exam Vitals and nursing note reviewed.  Constitutional:      Appearance: He is well-developed.     Comments: No acute distress  HENT:     Head: Normocephalic and atraumatic.     Ears:     Comments: Bilateral ears without tenderness to palpation of external auricle, tragus and mastoid, EAC's without erythema or swelling, TM's with good bony landmarks and cone of light. Non erythematous.      Nose: Nose normal.     Mouth/Throat:     Comments: Oral mucosa pink and moist, no tonsillar enlargement or exudate. Posterior pharynx patent and nonerythematous, no uvula deviation or swelling. Normal phonation.  Eyes:     Conjunctiva/sclera: Conjunctivae normal.  Cardiovascular:     Rate and Rhythm: Normal rate.  Pulmonary:     Effort: Pulmonary effort is normal. No respiratory distress.     Comments: Breathing comfortably at rest, CTABL, no wheezing, rales or other adventitious sounds auscultated  Abdominal:     General: There is no distension.  Musculoskeletal:        General: Normal range of motion.     Cervical back: Neck supple.  Skin:    General: Skin is warm and dry.  Neurological:     Mental Status: He is alert and oriented to person, place, and time.      UC Treatments / Results  Labs (all labs ordered are listed, but only abnormal results are displayed) Labs Reviewed - No data to display  EKG   Radiology No results found.  Procedures Procedures (including critical care time)  Medications Ordered in UC Medications - No data to display  Initial Impression / Assessment and Plan / UC Course  I have reviewed the triage vital signs and the nursing notes.  Pertinent labs & imaging results that were  available during my care of the patient were reviewed by me and considered in my medical decision making (see chart for details).     Suspect likely viral URI with cough, but given recent double sickening will cover for atypicals with azithromycin, continue symptomatic and supportive care as well.  Reports recent/prior COVID test negative.  Will defer further testing for now.  Rest fluids and continue to monitor symptoms.  Discussed strict return precautions. Patient verbalized understanding and is agreeable with plan.  Final Clinical Impressions(s) / UC Diagnoses   Final diagnoses:  Viral URI with cough     Discharge Instructions      Continue Claritin-D, Flonase May try over-the-counter Mucinex Tessalon every 8 hours for cough Begin azithromycin course-2 tabs today, 1 tab for the following 4 days Rest and fluids Honey and hot tea Follow-up if not improving or worse    ED Prescriptions     Medication Sig Dispense Auth. Provider   benzonatate (TESSALON) 200 MG capsule Take 1 capsule (200 mg total) by mouth 3 (three) times daily as needed for up to 7 days for cough. 28 capsule Reizy Dunlow C, PA-C   azithromycin (ZITHROMAX) 250 MG tablet  (Status: Discontinued) Take 1 tablet (250 mg total) by mouth daily. Take first 2 tablets together, then 1 every day until finished. 6 tablet Zniyah Midkiff C, PA-C   azithromycin (ZITHROMAX) 250 MG tablet Take first 2 tablets together, then 1 every day until finished. 6 tablet Roshawnda Pecora, [place] C, PA-C      PDMP not reviewed this encounter.   Sims Laday, [place] C, PA-C 06/21/21 1200

## 2021-06-21 NOTE — ED Triage Notes (Signed)
Pt states he had Covid symptoms last week but tested negative for Covid last week (about 4 days ago)  Current symptoms: Sweating, headaches

## 2021-06-21 NOTE — Discharge Instructions (Addendum)
Continue Claritin-D, Flonase May try over-the-counter Mucinex Tessalon every 8 hours for cough Begin azithromycin course-2 tabs today, 1 tab for the following 4 days Rest and fluids Honey and hot tea Follow-up if not improving or worse

## 2021-07-29 ENCOUNTER — Ambulatory Visit (INDEPENDENT_AMBULATORY_CARE_PROVIDER_SITE_OTHER): Payer: Self-pay | Admitting: Family Medicine

## 2021-07-29 ENCOUNTER — Other Ambulatory Visit: Payer: Self-pay

## 2021-07-29 ENCOUNTER — Encounter: Payer: Self-pay | Admitting: Family Medicine

## 2021-07-29 VITALS — Ht 74.0 in | Wt 235.0 lb

## 2021-07-29 DIAGNOSIS — M5412 Radiculopathy, cervical region: Secondary | ICD-10-CM

## 2021-07-29 MED ORDER — PREDNISONE 5 MG PO TABS: ORAL_TABLET | ORAL | 0 refills | Status: DC

## 2021-07-29 NOTE — Assessment & Plan Note (Signed)
Acutely occurring in symptoms most suggestive of radicular type pain.  He does have to look up on a regular basis which seem to exacerbate it. -Counseled on home exercise therapy and supportive care. -Prednisone. -Provided work note. -Could consider imaging or physical therapy.

## 2021-07-29 NOTE — Patient Instructions (Signed)
Good to see you Please try heat  Please try the exercises   Please send me a message in MyChart with any questions or updates.  Please see me back in 4 weeks .   --Dr. Mihir Flanigan  

## 2021-07-29 NOTE — Progress Notes (Signed)
  Rumaldo Sawyer - 60 y.o. male MRN [id number]  Date of birth: [birth year]  SUBJECTIVE:  Including CC & ROS.  No chief complaint on file.   Allen Sawyer is a 60 y.o. male that is presenting with acute altered sensation in his hand and pain down his right arm.  Is exacerbated with looking up.  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   Review of Systems See HPI   HISTORY: Past Medical, Surgical, Social, and Family History Reviewed & Updated per EMR.   Pertinent Historical Findings include:  Past Medical History:  Diagnosis Date   Sleep apnea     Past Surgical History:  Procedure Laterality Date   APPENDECTOMY      Family History  Problem Relation Age of Onset   Hyperlipidemia Mother    Hypertension Mother    Hyperlipidemia Father    Hypertension Father    Heart attack Father    Hyperlipidemia Sister    Hypertension Sister    Hypertension Brother    Hyperlipidemia Brother    Diabetes Brother    Heart attack Paternal Uncle    Sudden death Neg Hx     Social History   Socioeconomic History   Marital status: Married    Spouse name: Not on file   Number of children: Not on file   Years of education: Not on file   Highest education level: Not on file  Occupational History   Not on file  Tobacco Use   Smoking status: Former   Smokeless tobacco: Never  Substance and Sexual Activity   Alcohol use: Yes    Comment: occ   Drug use: No   Sexual activity: Not on file  Other Topics Concern   Not on file  Social History Narrative   Not on file   Social Determinants of Health   Financial Resource Strain: Not on file  Food Insecurity: Not on file  Transportation Needs: Not on file  Physical Activity: Not on file  Stress: Not on file  Social Connections: Not on file  Intimate Partner Violence: Not on file     PHYSICAL EXAM:  VS: Ht 6\' 2"  (1.88 m)   Wt 235 lb (106.6 kg)   BMI 30.17 kg/m  Physical Exam Gen: NAD, alert, cooperative  with exam, well-appearing      ASSESSMENT & PLAN:   Cervical radiculopathy Acutely occurring in symptoms most suggestive of radicular type pain.  He does have to look up on a regular basis which seem to exacerbate it. -Counseled on home exercise therapy and supportive care. -Prednisone. -Provided work note. -Could consider imaging or physical therapy.

## 2021-08-10 ENCOUNTER — Ambulatory Visit (INDEPENDENT_AMBULATORY_CARE_PROVIDER_SITE_OTHER): Payer: Self-pay | Admitting: Family Medicine

## 2021-08-10 ENCOUNTER — Ambulatory Visit (HOSPITAL_BASED_OUTPATIENT_CLINIC_OR_DEPARTMENT_OTHER): Payer: Self-pay

## 2021-08-10 ENCOUNTER — Other Ambulatory Visit: Payer: Self-pay

## 2021-08-10 ENCOUNTER — Encounter (HOSPITAL_BASED_OUTPATIENT_CLINIC_OR_DEPARTMENT_OTHER): Payer: Self-pay

## 2021-08-10 ENCOUNTER — Encounter: Payer: Self-pay | Admitting: Family Medicine

## 2021-08-10 VITALS — Ht 74.0 in | Wt 235.0 lb

## 2021-08-10 DIAGNOSIS — M5412 Radiculopathy, cervical region: Secondary | ICD-10-CM

## 2021-08-10 MED ORDER — GABAPENTIN 300 MG PO CAPS: 300.0000 mg | ORAL_CAPSULE | Freq: Three times a day (TID) | ORAL | 1 refills | Status: DC

## 2021-08-10 NOTE — Assessment & Plan Note (Signed)
Acutely worsening.  Worse with activities at work. -Counseled on home exercise therapy and supportive care. -Referral to physical therapy. -Gabapentin. -X-rays. -Completed paperwork

## 2021-08-10 NOTE — Progress Notes (Signed)
  Allen Sawyer - 60 y.o. male MRN [id number]  Date of birth: 31-May-1961  SUBJECTIVE:  Including CC & ROS.  No chief complaint on file.   Allen Sawyer is a 60 y.o. male that is presenting with acute worsening of his right arm pain.  He does have pain that extends down his upper back as well.  It is worse with repetitive activities and with neck and extension.  He does get relief with lying down at night.    Review of Systems See HPI   HISTORY: Past Medical, Surgical, Social, and Family History Reviewed & Updated per EMR.   Pertinent Historical Findings include:  Past Medical History:  Diagnosis Date   Sleep apnea     Past Surgical History:  Procedure Laterality Date   APPENDECTOMY      Family History  Problem Relation Age of Onset   Hyperlipidemia Mother    Hypertension Mother    Hyperlipidemia Father    Hypertension Father    Heart attack Father    Hyperlipidemia Sister    Hypertension Sister    Hypertension Brother    Hyperlipidemia Brother    Diabetes Brother    Heart attack Paternal Uncle    Sudden death Neg Hx     Social History   Socioeconomic History   Marital status: Married    Spouse name: Not on file   Number of children: Not on file   Years of education: Not on file   Highest education level: Not on file  Occupational History   Not on file  Tobacco Use   Smoking status: Former   Smokeless tobacco: Never  Substance and Sexual Activity   Alcohol use: Yes    Comment: occ   Drug use: No   Sexual activity: Not on file  Other Topics Concern   Not on file  Social History Narrative   Not on file   Social Determinants of Health   Financial Resource Strain: Not on file  Food Insecurity: Not on file  Transportation Needs: Not on file  Physical Activity: Not on file  Stress: Not on file  Social Connections: Not on file  Intimate Partner Violence: Not on file     PHYSICAL EXAM:  VS: Ht 6\' 2"  (1.88 m)   Wt 235 lb (106.6 kg)   BMI 30.17 kg/m   Physical Exam Gen: NAD, alert, cooperative with exam, well-appearing      ASSESSMENT & PLAN:   Cervical radiculopathy Acutely worsening.  Worse with activities at work. -Counseled on home exercise therapy and supportive care. -Referral to physical therapy. -Gabapentin. -X-rays. -Completed paperwork

## 2021-08-10 NOTE — Patient Instructions (Signed)
Good to see you Please start with one pill of gabapentin at night. You can increase to 2 or 3 times daily as you tolerate.  I will call with the xray results.  Please try physical therapy   Please send me a message in MyChart with any questions or updates.  Please see me back in 4 weeks.   --Dr. Jordan Likes

## 2021-08-11 ENCOUNTER — Ambulatory Visit: Payer: Self-pay | Admitting: Physical Therapy

## 2021-08-11 NOTE — Therapy (Signed)
[facility] [address]  [address] [place], [place], [address] Phone: [phone]   Fax:  [phone]  Patient Details  Name: Allen Sawyer MRN: [id number] Date of Birth: Apr 10, 1961 Referring Provider:  Myra Rude, MD  Encounter Date: 08/11/2021   Patient arrived on time for his evaluation, reporting an incident at work that caused his condition.  He does not currently have health insurance, his health insurance coverage starts next week and he also wants to initiate a workman's comp claim, so we decided to reschedule his evaluation to next week to avoid potentially a large bill that is not covered.    Jena Gauss PT, DPT 08/11/2021, 9:05 AM  [facility] [address]  [address] [place], [place], [address] Phone: [phone]   Fax:  [phone]

## 2021-08-19 ENCOUNTER — Ambulatory Visit: Payer: Self-pay | Attending: Family Medicine | Admitting: Physical Therapy

## 2021-08-23 ENCOUNTER — Emergency Department (HOSPITAL_BASED_OUTPATIENT_CLINIC_OR_DEPARTMENT_OTHER): Payer: Self-pay

## 2021-08-23 ENCOUNTER — Other Ambulatory Visit: Payer: Self-pay

## 2021-08-23 ENCOUNTER — Emergency Department (HOSPITAL_BASED_OUTPATIENT_CLINIC_OR_DEPARTMENT_OTHER)
Admission: EM | Admit: 2021-08-23 | Discharge: 2021-08-23 | Disposition: A | Discharge status: Home / Self Care | Payer: Self-pay | Attending: Emergency Medicine | Admitting: Emergency Medicine

## 2021-08-23 ENCOUNTER — Encounter (HOSPITAL_BASED_OUTPATIENT_CLINIC_OR_DEPARTMENT_OTHER): Payer: Self-pay

## 2021-08-23 DIAGNOSIS — M79604 Pain in right leg: Secondary | ICD-10-CM | POA: Insufficient documentation

## 2021-08-23 DIAGNOSIS — Z87891 Personal history of nicotine dependence: Secondary | ICD-10-CM | POA: Insufficient documentation

## 2021-08-23 DIAGNOSIS — M25561 Pain in right knee: Secondary | ICD-10-CM | POA: Insufficient documentation

## 2021-08-23 MED ORDER — IBUPROFEN 800 MG PO TABS
800.0000 mg | ORAL_TABLET | Freq: Once | ORAL | Status: AC
  Administered 2021-08-23: 800 mg via ORAL
  Filled 2021-08-23: qty 1

## 2021-08-23 MED ORDER — DICLOFENAC SODIUM 1 % EX GEL: 2.0000 g | Freq: Four times a day (QID) | CUTANEOUS | 1 refills | Status: DC

## 2021-08-23 MED ORDER — ACETAMINOPHEN 325 MG PO TABS
650.0000 mg | ORAL_TABLET | Freq: Once | ORAL | Status: AC
  Administered 2021-08-23: 650 mg via ORAL
  Filled 2021-08-23: qty 2

## 2021-08-23 NOTE — ED Provider Notes (Signed)
[facility] EMERGENCY DEPARTMENT Provider Note   CSN: [id number] Arrival date & time: 08/23/21  4503     History Chief Complaint  Patient presents with   Leg Pain    Allen Sawyer is a 60 y.o. male with no significant past medical history presents with 1 week of lateral right knee pain.  Patient reported a pain is 10 out of 10 when he is having his legs dangle, or walking, as 6-7 out of 10, throbbing in nature when he is at rest.  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  Patient denies any recent travel.  Patient denies history of cancer, or other coagulopathies.  Patient has taken ibuprofen for the pain which he does report relieves the pain, however does not last very long.  Patient has not had any imaging of the affected leg.  Patient reports that he had had similar pain at some point in the past when he was worried.  Patient reports that he saw his general practitioner who was somewhat worried about a blood clot and told him to get examined.  Patient denies chest pain, shortness of breath, hemoptysis, anticoagulation.   Leg Pain     Past Medical History:  Diagnosis Date   Sleep apnea     Patient Active Problem List   Diagnosis Date Noted   Cervical radiculopathy 07/29/2021   Seasonal allergies 03/08/2021   Acute midline thoracic back pain 08/19/2020   Low back pain 08/22/2011    Past Surgical History:  Procedure Laterality Date   APPENDECTOMY         Family History  Problem Relation Age of Onset   Hyperlipidemia Mother    Hypertension Mother    Hyperlipidemia Father    Hypertension Father    Heart attack Father    Hyperlipidemia Sister    Hypertension Sister    Hypertension Brother    Hyperlipidemia Brother    Diabetes Brother    Heart attack Paternal Uncle    Sudden death Neg Hx     Social History   Tobacco Use   Smoking  status: Former   Smokeless tobacco: Never  Substance Use Topics   Alcohol use: Yes    Comment: occ   Drug use: No    Home Medications Prior to Admission medications   Medication Sig Start Date End Date Taking? Authorizing Provider  diclofenac Sodium (VOLTAREN) 1 % GEL Apply 2 g topically 4 (four) times daily. 08/23/21  Yes Aramis Weil H, PA-C  azithromycin (ZITHROMAX) 250 MG tablet Take first 2 tablets together, then 1 every day until finished. 06/21/21   Wieters, Hallie C, PA-C  fluticasone (FLONASE) 50 MCG/ACT nasal spray Place 2 sprays into both nostrils daily. 03/08/21   Sharlene Dory, DO  gabapentin (NEURONTIN) 300 MG capsule Take 1 capsule (300 mg total) by mouth 3 (three) times daily. 08/10/21   Myra Rude, MD  levocetirizine (XYZAL) 5 MG tablet Take 1 tablet (5 mg total) by mouth every evening. 03/08/21   Sharlene Dory, DO  methocarbamol (ROBAXIN) 500 MG tablet Take 1 tablet (500 mg total) by mouth 2 (two) times daily. 05/15/21   Gailen Shelter, PA  ondansetron (ZOFRAN ODT) 4 MG disintegrating tablet Take 1 tablet (4 mg total) by mouth every 8 (eight) hours as needed for nausea or vomiting. 05/15/21   Gailen Shelter, PA  predniSONE (DELTASONE) 5 MG tablet Take 6 pills for first day, 5 pills second day, 4 pills third day, 3 pills fourth day, 2 pills the fifth day, and 1 pill sixth day. 07/29/21   Myra Rude, MD    Allergies    Patient has no known allergies.  Review of Systems   Review of Systems  Musculoskeletal:  Positive for arthralgias.  All other systems reviewed and are negative.  Physical Exam Updated Vital Signs BP (!) 155/85 (BP Location: Left Arm)   Pulse 68   Temp 98.3 F (36.8 C) (Oral)   Resp 18   Ht 6\' 2"  (1.88 m)   Wt 106.6 kg   SpO2 98%   BMI 30.17 kg/m   Physical Exam Vitals and nursing note reviewed.  Constitutional:      General: He is not in acute distress.    Appearance: Normal appearance.  HENT:      Head: Normocephalic and atraumatic.  Eyes:     General:        Right eye: No discharge.        Left eye: No discharge.  Cardiovascular:     Rate and Rhythm: Normal rate and regular rhythm.     Pulses: Normal pulses.  Pulmonary:     Effort: Pulmonary effort is normal. No respiratory distress.  Musculoskeletal:        General: No deformity.     Comments: Tenderness to palpation the lateral aspect of the right knee at the top of the tibial plateau/top of the fibular head.  No tenderness to palpation of the patellar or quadriceps tendon.  N negative balloon ballottement.  No varus or valgus laxity, no anterior posterior drawer laxity.  Gait is normal with some pain.  Strength 5 out of 5 with some pain to flexion extension of the knee joint.  Skin:    General: Skin is warm and dry.  Neurological:     Mental Status: He is alert and oriented to person, place, and time.  Psychiatric:        Mood and Affect: Mood normal.        Behavior: Behavior normal.    ED Results / Procedures / Treatments   Labs (all labs ordered are listed, but only abnormal results are displayed) Labs Reviewed - No data to display  EKG None  Radiology Venous Img Lower Unilateral Right  Result Date: 08/23/2021 CLINICAL DATA:  Right leg pain over the last week. EXAM: Right LOWER EXTREMITY VENOUS DOPPLER ULTRASOUND TECHNIQUE: Gray-scale sonography with compression, as well as color and duplex ultrasound, were performed to evaluate the deep venous system(s) from the level of the common femoral vein through the popliteal and proximal calf veins. COMPARISON:  None. FINDINGS: VENOUS Normal compressibility of the common femoral, superficial femoral, and popliteal veins, as well as the visualized calf veins. Visualized portions of profunda femoral vein and great saphenous vein unremarkable. No filling defects to suggest DVT on grayscale or color Doppler imaging. Doppler waveforms show normal direction of venous flow, normal  respiratory plasticity and response to augmentation. Limited views of the contralateral common femoral vein are unremarkable. OTHER None. Limitations: none IMPRESSION: Normal venous evaluation of the right lower extremity.  No DVT. Electronically Signed   By: 08/25/2021 M.D.   On: 08/23/2021 12:30   DG Knee Complete 4 Views Right  Result Date: 08/23/2021 CLINICAL DATA:  Anterolateral RIGHT knee pain with slight swelling, question injury at the gym EXAM: RIGHT KNEE -  COMPLETE 4+ VIEW COMPARISON:  None FINDINGS: Osseous mineralization low normal. Joint spaces preserved. No acute fracture, dislocation, or bone destruction. No joint effusion. IMPRESSION: No acute osseous abnormalities. Electronically Signed   By: Ulyses Southward M.D.   On: 08/23/2021 11:35    Procedures Procedures   Medications Ordered in ED Medications  ibuprofen (ADVIL) tablet 800 mg (800 mg Oral Given 08/23/21 1109)  acetaminophen (TYLENOL) tablet 650 mg (650 mg Oral Given 08/23/21 1109)    ED Course  I have reviewed the triage vital signs and the nursing notes.  Pertinent labs & imaging results that were available during my care of the patient were reviewed by me and considered in my medical decision making (see chart for details).    MDM Rules/Calculators/A&P                         I discussed this case with my attending physician who cosigned this note including patient's presenting symptoms, physical exam, and planned diagnostics and interventions. Attending physician stated agreement with plan or made changes to plan which were implemented.   Examination of the right knee reveals some tenderness to palpation at the lateral aspect of the right tibial head, right fibular head.  Consistent with inflammation of the subsartorial bursa.  Patient is now laxity of collateral ligaments.  Patient does not have a joint effusion.  Patient has no tenderness of the patellar or quadriceps tendons.  Patient has full flexion extension  strength of the right knee with some pain, full range of motion of the right knee with some pain.  There is some swelling and there was concern from PCP Will obtain an ultrasound of the affected area today to rule out DVT.  Low clinical suspicion for DVT.  No shortness of breath, chest pain, hemoptysis.  He does sit for a long time as a Estate agent however he does get out throughout his day.  No history of cancer, or other coagulopathy.  Radiographic imaging of the right knee does not reveal any acute findings, fracture, dislocation.  Ultrasound does not show DVT.  Will give patient prescription for Voltaren gel, encouraged scheduled Tylenol, ibuprofen use.  Encouraged ice, compression, elevation, rest.  Return precautions given.  Patient discharged in stable condition. Final Clinical Impression(s) / ED Diagnoses Final diagnoses:  Acute pain of right knee    Rx / DC Orders ED Discharge Orders          Ordered    diclofenac Sodium (VOLTAREN) 1 % GEL  4 times daily        08/23/21 1247             Cara Aguino, [place], PA-C 08/23/21 1248    Milagros Loll, MD 08/24/21 2035

## 2021-08-23 NOTE — Discharge Instructions (Signed)
Please use Tylenol or ibuprofen for pain.  You may use 600 mg ibuprofen every 6 hours or 1000 mg of Tylenol every 6 hours.  You may choose to alternate between the 2.  This would be most effective.  Not to exceed 4 g of Tylenol within 24 hours.  Not to exceed 3200 mg ibuprofen 24 hours.  You can use the Voltaren gel directly to the affected area where there is pain up to 4 times a day.  If you continue to experience pain please follow-up with the orthopedic doctor whose contact information I am attaching today.  I also encouraged rest, ice to the affected area, compression, elevation as needed if you are having flareups of the pain.

## 2021-08-23 NOTE — ED Triage Notes (Signed)
Pt been having right leg pain x 1 week to lateral right knee. Slight swelling and warmth noted.

## 2021-08-26 ENCOUNTER — Ambulatory Visit: Payer: Self-pay | Admitting: Family Medicine

## 2021-09-07 ENCOUNTER — Ambulatory Visit: Payer: Self-pay | Admitting: Family Medicine

## 2021-09-21 ENCOUNTER — Emergency Department (HOSPITAL_BASED_OUTPATIENT_CLINIC_OR_DEPARTMENT_OTHER)
Admission: EM | Admit: 2021-09-21 | Discharge: 2021-09-21 | Disposition: A | Discharge status: Home / Self Care | Payer: Self-pay | Attending: Emergency Medicine | Admitting: Emergency Medicine

## 2021-09-21 ENCOUNTER — Other Ambulatory Visit: Payer: Self-pay

## 2021-09-21 ENCOUNTER — Encounter (HOSPITAL_BASED_OUTPATIENT_CLINIC_OR_DEPARTMENT_OTHER): Payer: Self-pay

## 2021-09-21 DIAGNOSIS — M549 Dorsalgia, unspecified: Secondary | ICD-10-CM | POA: Insufficient documentation

## 2021-09-21 DIAGNOSIS — R109 Unspecified abdominal pain: Secondary | ICD-10-CM | POA: Insufficient documentation

## 2021-09-21 DIAGNOSIS — R350 Frequency of micturition: Secondary | ICD-10-CM | POA: Insufficient documentation

## 2021-09-21 DIAGNOSIS — Z87891 Personal history of nicotine dependence: Secondary | ICD-10-CM | POA: Insufficient documentation

## 2021-09-21 LAB — URINALYSIS, MICROSCOPIC (REFLEX)

## 2021-09-21 LAB — CBG MONITORING, ED: Glucose-Capillary: 91 mg/dL (ref 70–99)

## 2021-09-21 LAB — URINALYSIS, ROUTINE W REFLEX MICROSCOPIC
Bilirubin Urine: NEGATIVE
Glucose, UA: NEGATIVE mg/dL
Ketones, ur: NEGATIVE mg/dL
Leukocytes,Ua: NEGATIVE
Nitrite: NEGATIVE
Protein, ur: NEGATIVE mg/dL
Specific Gravity, Urine: 1.02 (ref 1.005–1.030)
pH: 5.5 (ref 5.0–8.0)

## 2021-09-21 MED ORDER — METHOCARBAMOL 500 MG PO TABS: 500.0000 mg | ORAL_TABLET | Freq: Two times a day (BID) | ORAL | 0 refills | Status: DC

## 2021-09-21 MED ORDER — NAPROXEN 500 MG PO TABS: 500.0000 mg | ORAL_TABLET | Freq: Two times a day (BID) | ORAL | 0 refills | Status: DC

## 2021-09-21 NOTE — ED Provider Notes (Signed)
[facility] EMERGENCY DEPARTMENT Provider Note   CSN: [id number] Arrival date & time: 09/21/21  0758     History Chief Complaint  Patient presents with   Flank Pain    Allen Sawyer is a 60 y.o. male.  HPI    60 year old male comes in with chief complaint of back pain. He has been having back pain over the left flank region for the last month or so.  Pain worse with certain activities.  Patient denies any burning with urination, blood in the urine -but does indicate that he is having some increased urinary frequency.  He has taken Tylenol without significant relief.  No trauma.  Patient does go to the gym regularly now and works as a Museum/gallery exhibitions officer.  Denies any trauma while performing those activities.  Past Medical History:  Diagnosis Date   Sleep apnea     Patient Active Problem List   Diagnosis Date Noted   Cervical radiculopathy 07/29/2021   Seasonal allergies 03/08/2021   Acute midline thoracic back pain 08/19/2020   Low back pain 08/22/2011    Past Surgical History:  Procedure Laterality Date   APPENDECTOMY         Family History  Problem Relation Age of Onset   Hyperlipidemia Mother    Hypertension Mother    Hyperlipidemia Father    Hypertension Father    Heart attack Father    Hyperlipidemia Sister    Hypertension Sister    Hypertension Brother    Hyperlipidemia Brother    Diabetes Brother    Heart attack Paternal Uncle    Sudden death Neg Hx     Social History   Tobacco Use   Smoking status: Former   Smokeless tobacco: Never  Substance Use Topics   Alcohol use: Yes    Comment: occ   Drug use: No    Home Medications Prior to Admission medications   Medication Sig Start Date End Date Taking? Authorizing Provider  methocarbamol (ROBAXIN) 500 MG tablet Take 1 tablet (500 mg total) by mouth 2 (two) times daily. 09/21/21  Yes Halah Whiteside, Janey Genta, MD  naproxen (NAPROSYN) 500 MG tablet Take 1 tablet (500 mg total) by mouth 2 (two) times  daily. 09/21/21  Yes Derwood Kaplan, MD  azithromycin (ZITHROMAX) 250 MG tablet Take first 2 tablets together, then 1 every day until finished. 06/21/21   Wieters, Hallie C, PA-C  diclofenac Sodium (VOLTAREN) 1 % GEL Apply 2 g topically 4 (four) times daily. 08/23/21   Prosperi, Christian H, PA-C  fluticasone (FLONASE) 50 MCG/ACT nasal spray Place 2 sprays into both nostrils daily. 03/08/21   Sharlene Dory, DO  gabapentin (NEURONTIN) 300 MG capsule Take 1 capsule (300 mg total) by mouth 3 (three) times daily. 08/10/21   Myra Rude, MD  levocetirizine (XYZAL) 5 MG tablet Take 1 tablet (5 mg total) by mouth every evening. 03/08/21   Sharlene Dory, DO  ondansetron (ZOFRAN ODT) 4 MG disintegrating tablet Take 1 tablet (4 mg total) by mouth every 8 (eight) hours as needed for nausea or vomiting. 05/15/21   Blanchie Dessert, Rodrigo Ran, PA  predniSONE (DELTASONE) 5 MG tablet Take 6 pills for first day, 5 pills second day, 4 pills third day, 3 pills fourth day, 2 pills the fifth day, and 1 pill sixth day. 07/29/21   Myra Rude, MD    Allergies    Patient has no known allergies.  Review of Systems   Review of Systems  Constitutional:  Positive  for activity change.  Genitourinary:  Positive for frequency. Negative for dysuria.  Musculoskeletal:  Positive for back pain.  Neurological:  Negative for numbness.   Physical Exam Updated Vital Signs BP (!) 147/101   Pulse 65   Temp 98.1 F (36.7 C) (Oral)   Resp 18   Ht 6\' 2"  (1.88 m)   Wt 108.9 kg   SpO2 97%   BMI 30.81 kg/m   Physical Exam Vitals and nursing note reviewed.  Constitutional:      Appearance: He is well-developed.  HENT:     Head: Atraumatic.  Cardiovascular:     Rate and Rhythm: Normal rate.  Pulmonary:     Effort: Pulmonary effort is normal.  Musculoskeletal:     Cervical back: Neck supple.     Comments: Reproducible tenderness in the flank region, L side  Skin:    General: Skin is warm.   Neurological:     Mental Status: He is alert and oriented to person, place, and time.    ED Results / Procedures / Treatments   Labs (all labs ordered are listed, but only abnormal results are displayed) Labs Reviewed  URINALYSIS, ROUTINE W REFLEX MICROSCOPIC - Abnormal; Notable for the following components:      Result Value   Color, Urine STRAW (*)    Hgb urine dipstick TRACE (*)    All other components within normal limits  URINALYSIS, MICROSCOPIC (REFLEX) - Abnormal; Notable for the following components:   Bacteria, UA RARE (*)    All other components within normal limits  CBG MONITORING, ED    EKG None  Radiology No results found.  Procedures Procedures   Medications Ordered in ED Medications - No data to display  ED Course  I have reviewed the triage vital signs and the nursing notes.  Pertinent labs & imaging results that were available during my care of the patient were reviewed by me and considered in my medical decision making (see chart for details).    MDM Rules/Calculators/A&P                           60 year old male comes in with chief complaint of flank pain.  Pain is worse with certain activities and movement.  He is pain-free when laying flat.  On exam, there was reproducible flank pain.  No spasms appreciated.  Patient is having urinary frequency, but no burning with urination.  Clinically does not appear to be kidney stones.  UA ordered and is also reassuring.  It appears to be a musculoskeletal etiology, advised RICE treatment for now.  Final Clinical Impression(s) / ED Diagnoses Final diagnoses:  Musculoskeletal back pain    Rx / DC Orders ED Discharge Orders          Ordered    naproxen (NAPROSYN) 500 MG tablet  2 times daily        09/21/21 1049    methocarbamol (ROBAXIN) 500 MG tablet  2 times daily        09/21/21 1050             09/23/21, MD 09/21/21 1055

## 2021-09-21 NOTE — Discharge Instructions (Addendum)
We saw you in the ER for back pain.  Please take the Naprosyn prescribed for the next 2 days, take the muscle relaxant as needed, and see your primary care doctor for further pain control.

## 2021-09-21 NOTE — ED Triage Notes (Signed)
Pt reports left flank pain and urinary frequency for one month.  Denies n/v, denies fever.  Tylenol in use for pain with some improvement.

## 2021-09-28 ENCOUNTER — Other Ambulatory Visit: Payer: Self-pay

## 2021-09-28 ENCOUNTER — Encounter (HOSPITAL_BASED_OUTPATIENT_CLINIC_OR_DEPARTMENT_OTHER): Payer: Self-pay | Admitting: *Deleted

## 2021-09-28 ENCOUNTER — Emergency Department (HOSPITAL_BASED_OUTPATIENT_CLINIC_OR_DEPARTMENT_OTHER)
Admission: EM | Admit: 2021-09-28 | Discharge: 2021-09-28 | Disposition: A | Discharge status: Home / Self Care | Payer: Self-pay | Attending: Emergency Medicine | Admitting: Emergency Medicine

## 2021-09-28 DIAGNOSIS — Z87891 Personal history of nicotine dependence: Secondary | ICD-10-CM | POA: Insufficient documentation

## 2021-09-28 DIAGNOSIS — X500XXA Overexertion from strenuous movement or load, initial encounter: Secondary | ICD-10-CM | POA: Insufficient documentation

## 2021-09-28 DIAGNOSIS — T148XXA Other injury of unspecified body region, initial encounter: Secondary | ICD-10-CM

## 2021-09-28 DIAGNOSIS — Z7952 Long term (current) use of systemic steroids: Secondary | ICD-10-CM | POA: Insufficient documentation

## 2021-09-28 DIAGNOSIS — J0111 Acute recurrent frontal sinusitis: Secondary | ICD-10-CM | POA: Insufficient documentation

## 2021-09-28 DIAGNOSIS — Z0279 Encounter for issue of other medical certificate: Secondary | ICD-10-CM | POA: Insufficient documentation

## 2021-09-28 DIAGNOSIS — S39012A Strain of muscle, fascia and tendon of lower back, initial encounter: Secondary | ICD-10-CM | POA: Insufficient documentation

## 2021-09-28 MED ORDER — AMOXICILLIN-POT CLAVULANATE 875-125 MG PO TABS: 1.0000 | ORAL_TABLET | Freq: Two times a day (BID) | ORAL | 0 refills | Status: AC

## 2021-09-28 NOTE — ED Provider Notes (Signed)
[facility] EMERGENCY DEPARTMENT Provider Note   CSN: [id number] Arrival date & time: 09/28/21  1249     History Chief Complaint  Patient presents with   Letter for School/Work    Allen Sawyer is a 60 y.o. male.  Past medical history of sleep apnea who presents to the emergency department requesting work note and concern for sinus infection.  Regarding the work note he states he was seen in the emergency department on 09/21/2021 for musculoskeletal back pain.  He had a full work-up which was negative and was discharged with presumed musculoskeletal back pain given muscle relaxant.  He states since he was seen his symptoms have resolved.  He states that he has been working out without issue.  He does state that his work is requiring him to be signed off in order to return.  They require sign off that he can perform his job duties, which includes lifting 80 pounds.  He states that he has been at the gym multiple times since and has lifted more than this without any issue.  He has no ongoing symptoms.  Regarding his sinuses, he states that he has obstructive sleep apnea which requires CPAP at night.  He states that he is not good about cleaning his CPAP machine and thinks he may have gotten sick due to this.  He complains of frontal sinus pressure and pain had been ongoing for a week.  He denies fevers, sore throat, cough, ear pain.  HPI     Past Medical History:  Diagnosis Date   Sleep apnea     Patient Active Problem List   Diagnosis Date Noted   Cervical radiculopathy 07/29/2021   Seasonal allergies 03/08/2021   Acute midline thoracic back pain 08/19/2020   Low back pain 08/22/2011    Past Surgical History:  Procedure Laterality Date   APPENDECTOMY         Family History  Problem Relation Age of Onset   Hyperlipidemia Mother    Hypertension Mother    Hyperlipidemia Father    Hypertension Father    Heart attack Father    Hyperlipidemia Sister     Hypertension Sister    Hypertension Brother    Hyperlipidemia Brother    Diabetes Brother    Heart attack Paternal Uncle    Sudden death Neg Hx     Social History   Tobacco Use   Smoking status: Former   Smokeless tobacco: Never  Building services engineer Use: Never used  Substance Use Topics   Alcohol use: Yes    Comment: occ   Drug use: No    Home Medications Prior to Admission medications   Medication Sig Start Date End Date Taking? Authorizing Provider  azithromycin (ZITHROMAX) 250 MG tablet Take first 2 tablets together, then 1 every day until finished. 06/21/21   Wieters, Hallie C, PA-C  diclofenac Sodium (VOLTAREN) 1 % GEL Apply 2 g topically 4 (four) times daily. 08/23/21   Prosperi, Christian H, PA-C  fluticasone (FLONASE) 50 MCG/ACT nasal spray Place 2 sprays into both nostrils daily. 03/08/21   Sharlene Dory, DO  gabapentin (NEURONTIN) 300 MG capsule Take 1 capsule (300 mg total) by mouth 3 (three) times daily. 08/10/21   Myra Rude, MD  levocetirizine (XYZAL) 5 MG tablet Take 1 tablet (5 mg total) by mouth every evening. 03/08/21   Sharlene Dory, DO  methocarbamol (ROBAXIN) 500 MG tablet Take 1 tablet (500 mg total) by mouth 2 (two)  times daily. 09/21/21   Derwood Kaplan, MD  naproxen (NAPROSYN) 500 MG tablet Take 1 tablet (500 mg total) by mouth 2 (two) times daily. 09/21/21   Derwood Kaplan, MD  ondansetron (ZOFRAN ODT) 4 MG disintegrating tablet Take 1 tablet (4 mg total) by mouth every 8 (eight) hours as needed for nausea or vomiting. 05/15/21   Blanchie Dessert, Rodrigo Ran, PA  predniSONE (DELTASONE) 5 MG tablet Take 6 pills for first day, 5 pills second day, 4 pills third day, 3 pills fourth day, 2 pills the fifth day, and 1 pill sixth day. 07/29/21   Myra Rude, MD    Allergies    Patient has no known allergies.  Review of Systems   Review of Systems  Constitutional:  Negative for fever.  HENT:  Positive for sinus pressure and sinus pain.  Negative for ear pain, rhinorrhea and sore throat.   All other systems reviewed and are negative.  Physical Exam Updated Vital Signs BP (!) 157/97 (BP Location: Right Arm)   Pulse 80   Temp 98 F (36.7 C) (Oral)   Resp 18   Ht 6\' 2"  (1.88 m)   Wt 108.9 kg   SpO2 98%   BMI 30.82 kg/m   Physical Exam Vitals and nursing note reviewed.  Constitutional:      General: He is not in acute distress.    Appearance: Normal appearance. He is not toxic-appearing.  HENT:     Head: Normocephalic and atraumatic.     Nose:     Right Sinus: Frontal sinus tenderness present.     Left Sinus: Frontal sinus tenderness present.     Mouth/Throat:     Mouth: Mucous membranes are moist.     Pharynx: Oropharynx is clear. No posterior oropharyngeal erythema.  Eyes:     General: No scleral icterus.    Extraocular Movements: Extraocular movements intact.     Pupils: Pupils are equal, round, and reactive to light.  Cardiovascular:     Pulses: Normal pulses.  Pulmonary:     Effort: Pulmonary effort is normal. No respiratory distress.     Breath sounds: Normal breath sounds.  Musculoskeletal:        General: No swelling, tenderness or signs of injury. Normal range of motion.  Skin:    General: Skin is warm and dry.     Findings: No rash.  Neurological:     General: No focal deficit present.     Mental Status: He is alert and oriented to person, place, and time.  Psychiatric:        Mood and Affect: Mood normal.        Behavior: Behavior normal.        Thought Content: Thought content normal.        Judgment: Judgment normal.    ED Results / Procedures / Treatments   Labs (all labs ordered are listed, but only abnormal results are displayed) Labs Reviewed - No data to display  EKG None  Radiology No results found.  Procedures Procedures   Medications Ordered in ED Medications - No data to display  ED Course  I have reviewed the triage vital signs and the nursing  notes.  Pertinent labs & imaging results that were available during my care of the patient were reviewed by me and considered in my medical decision making (see chart for details).    MDM Rules/Calculators/A&P 60 year old male who presents emergency department with sinus pain and need for work sign off.  Provided work sign off patient needed. He does have frontal sinus pressure with palpation.  No maxillary sinus pressure.  He does sound somewhat congested.  He has had no nasal discharge, cough, body aches or fever significant for viral infection.  Will prescribe him with Augmentin to cover for bacterial sinusitis. Vitals are stable, he is safe for discharge Final Clinical Impression(s) / ED Diagnoses Final diagnoses:  Acute recurrent frontal sinusitis  Musculoskeletal strain    Rx / DC Orders ED Discharge Orders          Ordered    amoxicillin-clavulanate (AUGMENTIN) 875-125 MG tablet  2 times daily        09/28/21 1555             Cristopher Peru, PA-C 09/28/21 1805    Alvira Monday, MD 09/29/21 1227

## 2021-09-28 NOTE — Discharge Instructions (Addendum)
In the emergency department today for a work note and sinus pain.  While you are here we filled out your return to work note.  I have also given you a short course of antibiotics for your sinus infection.  Please return to emergency department if you begin to have fever or worsening sinus pain and pressure.

## 2021-09-28 NOTE — ED Triage Notes (Signed)
Here for a work note.

## 2021-10-01 ENCOUNTER — Telehealth: Payer: Self-pay | Admitting: Family Medicine

## 2021-10-01 ENCOUNTER — Ambulatory Visit: Payer: Self-pay | Admitting: Family Medicine

## 2021-10-01 NOTE — Telephone Encounter (Signed)
Patient states he needs a work note stating he can go back to work "without any restrictions". He would like the note to be sent to his Mychart. Please advice.

## 2021-10-04 NOTE — Telephone Encounter (Signed)
Pt states is wanting letter to be sent to today to Fax number [phone].

## 2021-10-04 NOTE — Telephone Encounter (Signed)
Please send letter by fax (fax number below) and also mychart message of letter  ASAP.

## 2021-10-04 NOTE — Telephone Encounter (Signed)
Appt scheduled

## 2021-10-06 ENCOUNTER — Ambulatory Visit (INDEPENDENT_AMBULATORY_CARE_PROVIDER_SITE_OTHER): Payer: Self-pay | Admitting: Family Medicine

## 2021-10-06 ENCOUNTER — Encounter: Payer: Self-pay | Admitting: Family Medicine

## 2021-10-06 VITALS — BP 130/84 | HR 69 | Temp 98.4°F | Ht 74.0 in | Wt 238.1 lb

## 2021-10-06 DIAGNOSIS — M7918 Myalgia, other site: Secondary | ICD-10-CM

## 2021-10-06 NOTE — Progress Notes (Signed)
Chief Complaint  Patient presents with   Follow-up    ER Needs work note     Subjective: Patient is a 60 y.o. male here for ER f/u.  Pt seen in ED on 11/22 for L gluteal pain. He was seen again for similar issue on 11/29 in addition to sinusitis. He is still a little sore. Started after he made a specific move/doing sit ups. Doing better now, needs letter to return to work.   Past Medical History:  Diagnosis Date   Sleep apnea     Objective: BP 130/84   Pulse 69   Temp 98.4 F (36.9 C) (Oral)   Ht 6\' 2"  (1.88 m)   Wt 238 lb 2 oz (108 kg)   SpO2 97%   BMI 30.57 kg/m  General: Awake, appears stated age MSK: no ttp over lumbar spine region, mild ttp over L greater troch (reports better than prior) Neuro: Gait steady, fast paced, DTR's equal and symmetric, 5/5 strength in LE's b/l Lungs: No accessory muscle use Psych: Age appropriate judgment and insight, normal affect and mood  Assessment and Plan: Buttock pain  Letter to return to work w/o restrictions 10/06/21 today. Tylenol, stretches/exercises, heat, ice if things flare again. F/u as originally scheduled.  The patient voiced understanding and agreement to the plan.  14/7/22 [place], DO 10/06/21  7:09 AM

## 2021-10-06 NOTE — Patient Instructions (Signed)

## 2021-10-29 ENCOUNTER — Other Ambulatory Visit: Payer: Self-pay

## 2021-10-29 ENCOUNTER — Ambulatory Visit
Admission: EM | Admit: 2021-10-29 | Discharge: 2021-10-29 | Disposition: A | Discharge status: Home / Self Care | Payer: Self-pay | Attending: Emergency Medicine | Admitting: Emergency Medicine

## 2021-10-29 DIAGNOSIS — I10 Essential (primary) hypertension: Secondary | ICD-10-CM

## 2021-10-29 DIAGNOSIS — J329 Chronic sinusitis, unspecified: Secondary | ICD-10-CM

## 2021-10-29 DIAGNOSIS — J310 Chronic rhinitis: Secondary | ICD-10-CM

## 2021-10-29 DIAGNOSIS — H6591 Unspecified nonsuppurative otitis media, right ear: Secondary | ICD-10-CM

## 2021-10-29 MED ORDER — AZITHROMYCIN 250 MG PO TABS: 250.0000 mg | ORAL_TABLET | Freq: Every day | ORAL | 0 refills | Status: DC

## 2021-10-29 MED ORDER — IPRATROPIUM BROMIDE 0.06 % NA SOLN: 2.0000 | Freq: Four times a day (QID) | NASAL | 2 refills | Status: DC

## 2021-10-29 MED ORDER — FLUTICASONE PROPIONATE 50 MCG/ACT NA SUSP: 2.0000 | Freq: Every day | NASAL | 2 refills | Status: DC

## 2021-10-29 MED ORDER — AMLODIPINE BESYLATE 5 MG PO TABS: 5.0000 mg | ORAL_TABLET | Freq: Every day | ORAL | 2 refills | Status: DC

## 2021-10-29 NOTE — ED Provider Notes (Signed)
[facility]    CSN: [id number] Arrival date & time: 10/29/21  0903    HISTORY  No chief complaint on file.  HPI Allen Sawyer is a 60 y.o. male. Patient states he is retired from [employer] in the [employer]	[place].  States that yesterday began having chills and sinus congestion.  Patient states he has been prescribed Flonase and allergy medicines many times over the years however he does not like to take medicine so never continues using them once resolved.  Patient states he is not using them now.  Patient states he is never been diagnosed or treated for high blood pressure.  Blood pressure is very elevated on arrival today.  Patient states he exercises regularly, has a low-salt diet, does not eat fried fatty foods, tries to maintain his weight.  Patient states he has a cousin who died from hypertensive kidney failure recently.  Patient states he is not tried any remedies for his current chills and sinus congestion.  Is afebrile at this time, well-appearing.  Heart rate is normal, respirations are normal, oxygen is normal.  The history is provided by the patient.  Past Medical History:  Diagnosis Date   Sleep apnea    Patient Active Problem List   Diagnosis Date Noted   Cervical radiculopathy 07/29/2021   Seasonal allergies 03/08/2021   Acute midline thoracic back pain 08/19/2020   Low back pain 08/22/2011   Past Surgical History:  Procedure Laterality Date   APPENDECTOMY      Home Medications    Prior to Admission medications   Medication Sig Start Date End Date Taking? Authorizing Provider  diclofenac Sodium (VOLTAREN) 1 % GEL Apply 2 g topically 4 (four) times daily. 08/23/21   Prosperi, Christian H, PA-C  gabapentin (NEURONTIN) 300 MG capsule Take 1 capsule (300 mg total) by mouth 3 (three) times daily. 08/10/21   Myra Rude, MD  levocetirizine (XYZAL) 5 MG tablet Take 1 tablet (5 mg total) by mouth every evening. 03/08/21   Sharlene Dory, DO   methocarbamol (ROBAXIN) 500 MG tablet Take 1 tablet (500 mg total) by mouth 2 (two) times daily. 09/21/21   Derwood Kaplan, MD  naproxen (NAPROSYN) 500 MG tablet Take 1 tablet (500 mg total) by mouth 2 (two) times daily. 09/21/21   Derwood Kaplan, MD  ondansetron (ZOFRAN ODT) 4 MG disintegrating tablet Take 1 tablet (4 mg total) by mouth every 8 (eight) hours as needed for nausea or vomiting. 05/15/21   Gailen Shelter, PA   Family History Family History  Problem Relation Age of Onset   Hyperlipidemia Mother    Hypertension Mother    Hyperlipidemia Father    Hypertension Father    Heart attack Father    Hyperlipidemia Sister    Hypertension Sister    Hypertension Brother    Hyperlipidemia Brother    Diabetes Brother    Heart attack Paternal Uncle    Sudden death Neg Hx    Social History Social History   Tobacco Use   Smoking status: Former   Smokeless tobacco: Never  Building services engineer Use: Never used  Substance Use Topics   Alcohol use: Yes    Comment: occ   Drug use: No   Allergies   Patient has no known allergies.  Review of Systems Review of Systems Pertinent findings noted in history of present illness.   Physical Exam Triage Vital Signs ED Triage Vitals  Enc Vitals Group  BP 08/27/21 0827 (!) 147/82     Pulse Rate 08/27/21 0827 72     Resp 08/27/21 0827 18     Temp 08/27/21 0827 98.3 F (36.8 C)     Temp Source 08/27/21 0827 Oral     SpO2 08/27/21 0827 98 %     Weight --      Height --      Head Circumference --      Peak Flow --      Pain Score 08/27/21 0826 5     Pain Loc --      Pain Edu? --      Excl. in GC? --   No data found.  Updated Vital Signs BP (!) 165/95 (BP Location: Left Arm)    Pulse 72    Temp 98.6 F (37 C) (Oral)    Resp 20    SpO2 96%   Physical Exam Vitals and nursing note reviewed.  Constitutional:      General: He is not in acute distress.    Appearance: Normal appearance. He is not ill-appearing.  HENT:      Head: Normocephalic and atraumatic.     Salivary Glands: Right salivary gland is not diffusely enlarged or tender. Left salivary gland is not diffusely enlarged or tender.     Right Ear: Ear canal and external ear normal. No drainage. A middle ear effusion (Mildly suppurative) is present. There is no impacted cerumen. Tympanic membrane is bulging. Tympanic membrane is not injected or erythematous.     Left Ear: Ear canal and external ear normal. No drainage. A middle ear effusion is present. There is no impacted cerumen. Tympanic membrane is bulging. Tympanic membrane is not injected or erythematous.     Ears:     Comments: Bilateral EACs normal, left TM bulging with clear fluid, right TM bulging with milky fluid    Nose: Rhinorrhea present. No nasal deformity, septal deviation, signs of injury, nasal tenderness, mucosal edema or congestion. Rhinorrhea is clear.     Right Nostril: Occlusion present. No foreign body, epistaxis or septal hematoma.     Left Nostril: Occlusion present. No foreign body, epistaxis or septal hematoma.     Right Turbinates: Enlarged, swollen and pale.     Left Turbinates: Enlarged, swollen and pale.     Right Sinus: No maxillary sinus tenderness or frontal sinus tenderness.     Left Sinus: No maxillary sinus tenderness or frontal sinus tenderness.     Mouth/Throat:     Lips: Pink. No lesions.     Mouth: Mucous membranes are moist. No oral lesions.     Pharynx: Oropharynx is clear. Uvula midline. No posterior oropharyngeal erythema or uvula swelling.     Tonsils: No tonsillar exudate. 0 on the right. 0 on the left.     Comments: Postnasal drip Eyes:     General: Lids are normal.        Right eye: No discharge.        Left eye: No discharge.     Extraocular Movements: Extraocular movements intact.     Conjunctiva/sclera: Conjunctivae normal.     Right eye: Right conjunctiva is not injected.     Left eye: Left conjunctiva is not injected.  Neck:     Trachea: Trachea  and phonation normal.  Cardiovascular:     Rate and Rhythm: Normal rate and regular rhythm.     Pulses: Normal pulses.     Heart sounds: Normal heart sounds. No murmur  heard.   No friction rub. No gallop.  Pulmonary:     Effort: Pulmonary effort is normal. No accessory muscle usage, prolonged expiration or respiratory distress.     Breath sounds: Normal breath sounds. No stridor, decreased air movement or transmitted upper airway sounds. No decreased breath sounds, wheezing, rhonchi or rales.  Chest:     Chest wall: No tenderness.  Musculoskeletal:        General: Normal range of motion.     Cervical back: Normal range of motion and neck supple. Normal range of motion.  Lymphadenopathy:     Cervical: No cervical adenopathy.  Skin:    General: Skin is warm and dry.     Findings: No erythema or rash.  Neurological:     General: No focal deficit present.     Mental Status: He is alert and oriented to person, place, and time.  Psychiatric:        Mood and Affect: Mood normal.        Behavior: Behavior normal.    Visual Acuity Right Eye Distance:   Left Eye Distance:   Bilateral Distance:    Right Eye Near:   Left Eye Near:    Bilateral Near:     UC Couse / Diagnostics / Procedures:    EKG  Radiology No results found.  Procedures Procedures (including critical care time)  UC Diagnoses / Final Clinical Impressions(s)   I have reviewed the triage vital signs and the nursing notes.  Pertinent labs & imaging results that were available during my care of the patient were reviewed by me and considered in my medical decision making (see chart for details).   Final diagnoses:  Essential hypertension  Rhinosinusitis  Right otitis media with effusion   Discussed with patient his 3 biggest risk factors for heart attack, stroke and kidney failure which includes race, his age and his sex.  Recommend patient begin amlodipine, patient is in agreement with plan.  Patient advised to  resume his allergy medications at this time, given slightly abnormal finding on otic exam, recommend he begin azithromycin preventatively for possible brewing otitis media.  ED Prescriptions     Medication Sig Dispense Auth. Provider   amLODipine (NORVASC) 5 MG tablet Take 1 tablet (5 mg total) by mouth daily. 30 tablet Theadora Rama Scales, PA-C   azithromycin (ZITHROMAX) 250 MG tablet Take 1 tablet (250 mg total) by mouth daily. Take first 2 tablets together, then 1 every day until finished. 6 tablet Theadora Rama Scales, PA-C   ipratropium (ATROVENT) 0.06 % nasal spray Place 2 sprays into both nostrils 4 (four) times daily. As needed for nasal congestion, runny nose 15 mL Theadora Rama Scales, PA-C   fluticasone (FLONASE) 50 MCG/ACT nasal spray Place 2 sprays into both nostrils daily. 18 mL Theadora Rama Scales, PA-C      PDMP not reviewed this encounter.  Pending results:  Labs Reviewed - No data to display  Medications Ordered in UC: Medications - No data to display  Disposition Upon Discharge:  Condition: stable for discharge home Home: take medications as prescribed; routine discharge instructions as discussed; follow up as advised.  Patient presented with an acute illness with associated systemic symptoms and significant discomfort requiring urgent management. In my opinion, this is a condition that a prudent lay person (someone who possesses an average knowledge of health and medicine) may potentially expect to result in complications if not addressed urgently such as respiratory distress, impairment of bodily function or  dysfunction of bodily organs.   Routine symptom specific, illness specific and/or disease specific instructions were discussed with the patient and/or caregiver at length.   As such, the patient has been evaluated and assessed, work-up was performed and treatment was provided in alignment with urgent care protocols and evidence based medicine.   Patient/parent/caregiver has been advised that the patient may require follow up for further testing and treatment if the symptoms continue in spite of treatment, as clinically indicated and appropriate.  If the patient was tested for COVID-19, Influenza and/or RSV, then the patient/parent/guardian was advised to isolate at home pending the results of his/her diagnostic coronavirus test and potentially longer if theyre positive. I have also advised pt that if his/her COVID-19 test returns positive, it's recommended to self-isolate for at least 10 days after symptoms first appeared AND until fever-free for 24 hours without fever reducer AND other symptoms have improved or resolved. Discussed self-isolation recommendations as well as instructions for household member/close contacts as per the [facility] and [place] DHHS, and also gave patient the COVID packet with this information.  Patient/parent/caregiver has been advised to return to the [facility] or PCP in 3-5 days if no better; to PCP or the Emergency Department if new signs and symptoms develop, or if the current signs or symptoms continue to change or worsen for further workup, evaluation and treatment as clinically indicated and appropriate  The patient will follow up with their current PCP if and as advised. If the patient does not currently have a PCP we will assist them in obtaining one.   The patient may need specialty follow up if the symptoms continue, in spite of conservative treatment and management, for further workup, evaluation, consultation and treatment as clinically indicated and appropriate.  Patient/parent/caregiver verbalized understanding and agreement of plan as discussed.  All questions were addressed during visit.  Please see discharge instructions below for further details of plan.  Discharge Instructions:   Discharge Instructions      For your blood pressure, please begin amlodipine.  This is a calcium channel blocker, it works by  reducing the muscular resistance of the arteries in your arms and limbs that causes increased pressure.  I am glad that you already exercise daily, watch the salt in your diet, avoid fried, fatty foods and keep yourself in good shape, I commend you for your wonderful efforts and hope you never stop.  At this point, I think it is time that you think your kidneys and your heart further excellent service and agree to give him a break by starting this medication.  I really appreciate your understanding of how important it is to medications to help you achieve and maintain blood pressure control.  I have sent a prescription for amlodipine 5 mg to your pharmacy, please take 1 tablet daily every morning.  It is medication causes unwanted side effects, please do not hesitate to return for repeat evaluation and allow me to help you find an alternative that is better tolerated.  Common side effect of this medication is increased swelling in the lower extremities, this is due to the peripheral arteries being less constricted so sometimes fluid can escape from the arteries and pool around the feet and ankles.  Typically this is seen in patients who have other comorbidities such as diabetes, heart failure, poor vein function, do not believe that you suffer from any of these.  For your rhinosinusitis (nasal and sinus congestion) I recommend that you begin Flonase 2 sprays in  each nare every morning.  Additionally, for your acute symptoms, I recommend that you use Atrovent nasal spray in addition to the Flonase.  After you spray 2 sprays of Flonase in the morning, spray and additional 2 sprays of Atrovent in each nare.  Please do so again 3 more times throughout the day for total of 4 treatments per day.  Over the next few days, you may begin to feel like this is overkill so please feel free to reduce the frequency of dosing down to a comfortable level where your sinus passages are dry, you are not having any sniffling and not  coughing or frequently clearing your throat due to postnasal drip.  When your upper respiratory tract is calm, uninflamed, efficient, you are less prone to getting infections such as the bacterial infection that is brewing in your right ear at this time.  You are also less prone to acquiring viruses such as rhinovirus, adenovirus, parainfluenza, influenza and COVID.  Think of Flonase nasal spray as being preventive not and interventive.  For the possible ear infection in your right ear, recommend you begin a 5-day course of azithromycin, please take 2 tablets today then take 1 tablet daily thereafter until complete.  This medication will stay in your system for a full 5 days more provided with good coverage while you are working on drying out your nasal passages and getting your breathing improved.  Please do start back on your CPAP as it is possible, I think you will feel better when she get a really good night sleep.  Thank you so much for visiting urgent care today.  It was a pleasure to meet you.  If you need anything else, please do not hesitate to let me know.      This office note has been dictated using Teaching laboratory technician.  Unfortunately, and despite my best efforts, this method of dictation can sometimes lead to occasional typographical or grammatical errors.  I apologize in advance if this occurs.     Theadora Rama Scales, [place] 10/29/21 [phone]

## 2021-10-29 NOTE — Discharge Instructions (Addendum)
For your blood pressure, please begin amlodipine.  This is a calcium channel blocker, it works by reducing the muscular resistance of the arteries in your arms and limbs that causes increased pressure.  I am glad that you already exercise daily, watch the salt in your diet, avoid fried, fatty foods and keep yourself in good shape, I commend you for your wonderful efforts and hope you never stop.  At this point, I think it is time that you think your kidneys and your heart further excellent service and agree to give him a break by starting this medication.  I really appreciate your understanding of how important it is to medications to help you achieve and maintain blood pressure control.  I have sent a prescription for amlodipine 5 mg to your pharmacy, please take 1 tablet daily every morning.  It is medication causes unwanted side effects, please do not hesitate to return for repeat evaluation and allow me to help you find an alternative that is better tolerated.  Common side effect of this medication is increased swelling in the lower extremities, this is due to the peripheral arteries being less constricted so sometimes fluid can escape from the arteries and pool around the feet and ankles.  Typically this is seen in patients who have other comorbidities such as diabetes, heart failure, poor vein function, do not believe that you suffer from any of these.  For your rhinosinusitis (nasal and sinus congestion) I recommend that you begin Flonase 2 sprays in each nare every morning.  Additionally, for your acute symptoms, I recommend that you use Atrovent nasal spray in addition to the Flonase.  After you spray 2 sprays of Flonase in the morning, spray and additional 2 sprays of Atrovent in each nare.  Please do so again 3 more times throughout the day for total of 4 treatments per day.  Over the next few days, you may begin to feel like this is overkill so please feel free to reduce the frequency of dosing down to  a comfortable level where your sinus passages are dry, you are not having any sniffling and not coughing or frequently clearing your throat due to postnasal drip.  When your upper respiratory tract is calm, uninflamed, efficient, you are less prone to getting infections such as the bacterial infection that is brewing in your right ear at this time.  You are also less prone to acquiring viruses such as rhinovirus, adenovirus, parainfluenza, influenza and COVID.  Think of Flonase nasal spray as being preventive not and interventive.  For the possible ear infection in your right ear, recommend you begin a 5-day course of azithromycin, please take 2 tablets today then take 1 tablet daily thereafter until complete.  This medication will stay in your system for a full 5 days more provided with good coverage while you are working on drying out your nasal passages and getting your breathing improved.  Please do start back on your CPAP as it is possible, I think you will feel better when she get a really good night sleep.  Thank you so much for visiting urgent care today.  It was a pleasure to meet you.  If you need anything else, please do not hesitate to let me know.

## 2021-10-29 NOTE — ED Triage Notes (Signed)
Pr reports having chills and sinus congestion.  Started: Burgess Estelle

## 2021-11-22 ENCOUNTER — Ambulatory Visit: Payer: Managed Care, Other (non HMO) | Admitting: Family Medicine

## 2021-11-22 ENCOUNTER — Encounter: Payer: Self-pay | Admitting: Family Medicine

## 2021-11-22 VITALS — BP 137/84 | HR 73 | Temp 98.3°F | Resp 12 | Ht 74.0 in | Wt 232.8 lb

## 2021-11-22 DIAGNOSIS — J309 Allergic rhinitis, unspecified: Secondary | ICD-10-CM

## 2021-11-22 DIAGNOSIS — R5383 Other fatigue: Secondary | ICD-10-CM

## 2021-11-22 MED ORDER — LEVOCETIRIZINE DIHYDROCHLORIDE 5 MG PO TABS: 5.0000 mg | ORAL_TABLET | Freq: Every evening | ORAL | 1 refills | Status: DC

## 2021-11-22 NOTE — Progress Notes (Signed)
Acute Office Visit  Subjective:    Patient ID: Allen Sawyer, male    DOB: 1961/04/14, 61 y.o.   MRN: [id number]  Chief Complaint  Patient presents with   Hypertension    Having dizziness and weakness in both legs since being but on medication.    HPI Patient is in today for fatigue, dizziness, weakness.   Patient states that he was started on amlodipine about a month ago after going to urgent care - reports the night before he had been drinking with friends and was feeling hung-over at appointment. Reports that since then he has felt like his "strength has been wiped away and energy level drops often." He is usually very active and goes to the gym regularly. He is also feeling dizzy/lightheaded multiple times per day. He denies any chest pain, shortness of breath, wheezing, edema.   He also reports that when he is at work and home he has frequent nasal congestion, post nasal drip, sinus pressure - always with clear mucus. States he always feels like he is having colds or sinus problems. Reports he works in a very dusty environment and typically does not wear a mask. Additionally, he wonders if his apartment may have mold. Reports that when he is out of town for multiple days in a row all of his sinus symptoms resolve. Flonase does tend to help as well, but he is not on any allergy medicine. He would like to get allergy tested. He denies any trouble breathing, fevers, rashes, ear pain, sore throat.    Past Medical History:  Diagnosis Date   Sleep apnea     Past Surgical History:  Procedure Laterality Date   APPENDECTOMY      Family History  Problem Relation Age of Onset   Hyperlipidemia Mother    Hypertension Mother    Hyperlipidemia Father    Hypertension Father    Heart attack Father    Hyperlipidemia Sister    Hypertension Sister    Hypertension Brother    Hyperlipidemia Brother    Diabetes Brother    Heart attack Paternal Uncle    Sudden death Neg Hx     Social  History   Socioeconomic History   Marital status: Married    Spouse name: Not on file   Number of children: Not on file   Years of education: Not on file   Highest education level: Not on file  Occupational History   Not on file  Tobacco Use   Smoking status: Former   Smokeless tobacco: Never  Vaping Use   Vaping Use: Never used  Substance and Sexual Activity   Alcohol use: Yes    Comment: occ   Drug use: No   Sexual activity: Not on file  Other Topics Concern   Not on file  Social History Narrative   Not on file   Social Determinants of Health   Financial Resource Strain: Not on file  Food Insecurity: Not on file  Transportation Needs: Not on file  Physical Activity: Not on file  Stress: Not on file  Social Connections: Not on file  Intimate Partner Violence: Not on file    Outpatient Medications Prior to Visit  Medication Sig Dispense Refill   amLODipine (NORVASC) 5 MG tablet Take 1 tablet (5 mg total) by mouth daily. 30 tablet 2   fluticasone (FLONASE) 50 MCG/ACT nasal spray Place 2 sprays into both nostrils daily. 18 mL 2   azithromycin (ZITHROMAX) 250 MG tablet Take 1  tablet (250 mg total) by mouth daily. Take first 2 tablets together, then 1 every day until finished. 6 tablet 0   diclofenac Sodium (VOLTAREN) 1 % GEL Apply 2 g topically 4 (four) times daily. 50 g 1   gabapentin (NEURONTIN) 300 MG capsule Take 1 capsule (300 mg total) by mouth 3 (three) times daily. 90 capsule 1   ipratropium (ATROVENT) 0.06 % nasal spray Place 2 sprays into both nostrils 4 (four) times daily. As needed for nasal congestion, runny nose 15 mL 2   levocetirizine (XYZAL) 5 MG tablet Take 1 tablet (5 mg total) by mouth every evening. 30 tablet 2   methocarbamol (ROBAXIN) 500 MG tablet Take 1 tablet (500 mg total) by mouth 2 (two) times daily. 10 tablet 0   naproxen (NAPROSYN) 500 MG tablet Take 1 tablet (500 mg total) by mouth 2 (two) times daily. 10 tablet 0   ondansetron (ZOFRAN ODT)  4 MG disintegrating tablet Take 1 tablet (4 mg total) by mouth every 8 (eight) hours as needed for nausea or vomiting. 20 tablet 0   No facility-administered medications prior to visit.    No Known Allergies  Review of Systems All review of systems negative except what is listed in the HPI     Objective:    Physical Exam Vitals reviewed.  Constitutional:      Appearance: Normal appearance.  HENT:     Head: Normocephalic and atraumatic.     Right Ear: Tympanic membrane normal.     Left Ear: Tympanic membrane normal.     Nose: Congestion present.     Mouth/Throat:     Mouth: Mucous membranes are moist.     Pharynx: Oropharynx is clear. No oropharyngeal exudate or posterior oropharyngeal erythema.  Eyes:     Extraocular Movements: Extraocular movements intact.     Conjunctiva/sclera: Conjunctivae normal.  Cardiovascular:     Rate and Rhythm: Normal rate and regular rhythm.  Pulmonary:     Effort: Pulmonary effort is normal.     Breath sounds: Normal breath sounds.  Musculoskeletal:     Cervical back: Normal range of motion and neck supple.     Right lower leg: No edema.     Left lower leg: No edema.  Skin:    General: Skin is warm and dry.  Neurological:     General: No focal deficit present.     Mental Status: He is alert and oriented to person, place, and time. Mental status is at baseline.  Psychiatric:        Mood and Affect: Mood normal.        Behavior: Behavior normal.        Thought Content: Thought content normal.        Judgment: Judgment normal.    BP 137/84 (BP Location: Left Arm, Cuff Size: Large)    Pulse 73    Temp 98.3 F (36.8 C) (Oral)    Resp 12    Ht 6\' 2"  (1.88 m)    Wt 232 lb 12.8 oz (105.6 kg)    SpO2 98%    BMI 29.89 kg/m  Wt Readings from Last 3 Encounters:  11/22/21 232 lb 12.8 oz (105.6 kg)  10/06/21 238 lb 2 oz (108 kg)  09/28/21 240 lb 1.3 oz (108.9 kg)    Health Maintenance Due  Topic Date Due   COLONOSCOPY (Pts 45-8yrs Insurance  coverage will need to be confirmed)  Never done   Zoster Vaccines- Shingrix (1 of 2)  Never done   COVID-19 Vaccine (3 - Booster for Pfizer series) 03/30/2020    There are no preventive care reminders to display for this patient.   No results found for: TSH Lab Results  Component Value Date   WBC 4.4 09/04/2020   HGB 14.7 09/04/2020   HCT 42.1 09/04/2020   MCV 95.9 09/04/2020   PLT 160 09/04/2020   Lab Results  Component Value Date   NA 140 09/04/2020   K 4.6 09/04/2020   CO2 28 09/04/2020   GLUCOSE 92 09/04/2020   BUN 15 09/04/2020   CREATININE 1.08 09/04/2020   BILITOT 0.5 09/04/2020   ALKPHOS 70 09/13/2019   AST 19 09/04/2020   ALT 19 09/04/2020   PROT 7.6 09/04/2020   ALBUMIN 4.1 09/13/2019   CALCIUM 9.7 09/04/2020   GFR 96.75 09/13/2019   Lab Results  Component Value Date   CHOL 193 09/04/2020   Lab Results  Component Value Date   HDL 63 09/04/2020   Lab Results  Component Value Date   LDLCALC 108 (H) 09/04/2020   Lab Results  Component Value Date   TRIG 108 09/04/2020   Lab Results  Component Value Date   CHOLHDL 3.1 09/04/2020   No results found for: HGBA1C     Assessment & Plan:   1. Fatigue, unspecified type Try stopping the amlodipine Check your blood pressure a few times a week and let us know if staying >140/90 Start using your CPAP regularly Blood work today to check for other potential causes of fatigue Follow-up in 2 weeks so we can see if your symptoms are improved and recheck your blood pressure   - CBC with Differential/Platelet - Comprehensive metabolic panel - TSH  2. Allergic rhinitis, unspecified seasonality, unspecified trigger Start Xyzal daily Continue Flonase Referral to Allergist for testing  Try to wear a mask when in dusty environments at work  - levocetirizine (XYZAL) 5 MG tablet; Take 1 tablet (5 mg total) by mouth every evening.  Dispense: 90 tablet; Refill: 1 - Ambulatory referral to Allergy    Follow-up  in 2 weeks or sooner if needed.    Clayborne Danaaylor B Andriana Casa, NP

## 2021-11-22 NOTE — Patient Instructions (Addendum)
For fatigue/dizziness: Try stopping the amlodipine Check your blood pressure a few times a week and let us know if staying >140/90 Start using your CPAP regularly Blood work today to check for other potential causes of fatigue Follow-up in 2 weeks so we can see if your symptoms are improved and recheck your blood pressure   For your drainage/allergy symptoms: Start Xyzal daily Continue Flonase Referral to Allergist for testing

## 2021-11-23 LAB — CBC WITH DIFFERENTIAL/PLATELET
Basophils Absolute: 0 10*3/uL (ref 0.0–0.1)
Basophils Relative: 0.7 % (ref 0.0–3.0)
Eosinophils Absolute: 0.1 10*3/uL (ref 0.0–0.7)
Eosinophils Relative: 1.6 % (ref 0.0–5.0)
HCT: 43.8 % (ref 39.0–52.0)
Hemoglobin: 14.5 g/dL (ref 13.0–17.0)
Lymphocytes Relative: 44.3 % (ref 12.0–46.0)
Lymphs Abs: 2 10*3/uL (ref 0.7–4.0)
MCHC: 33.1 g/dL (ref 30.0–36.0)
MCV: 97.4 fl (ref 78.0–100.0)
Monocytes Absolute: 0.4 10*3/uL (ref 0.1–1.0)
Monocytes Relative: 10 % (ref 3.0–12.0)
Neutro Abs: 1.9 10*3/uL (ref 1.4–7.7)
Neutrophils Relative %: 43.4 % (ref 43.0–77.0)
Platelets: 165 10*3/uL (ref 150.0–400.0)
RBC: 4.5 Mil/uL (ref 4.22–5.81)
RDW: 12.7 % (ref 11.5–15.5)
WBC: 4.5 10*3/uL (ref 4.0–10.5)

## 2021-11-23 LAB — COMPREHENSIVE METABOLIC PANEL
ALT: 21 U/L (ref 0–53)
AST: 23 U/L (ref 0–37)
Albumin: 4.3 g/dL (ref 3.5–5.2)
Alkaline Phosphatase: 62 U/L (ref 39–117)
BUN: 20 mg/dL (ref 6–23)
CO2: 31 mEq/L (ref 19–32)
Calcium: 9.9 mg/dL (ref 8.4–10.5)
Chloride: 104 mEq/L (ref 96–112)
Creatinine, Ser: 1.24 mg/dL (ref 0.40–1.50)
GFR: 64.07 mL/min (ref >60.00)
Glucose, Bld: 102 mg/dL — ABNORMAL HIGH (ref 70–99)
Potassium: 4.5 mEq/L (ref 3.5–5.1)
Sodium: 141 mEq/L (ref 135–145)
Total Bilirubin: 0.5 mg/dL (ref 0.2–1.2)
Total Protein: 7.5 g/dL (ref 6.0–8.3)

## 2021-11-23 LAB — TSH: TSH: 2.3 u[IU]/mL (ref 0.35–5.50)

## 2021-11-24 ENCOUNTER — Ambulatory Visit: Payer: Self-pay | Admitting: Family Medicine

## 2021-11-29 ENCOUNTER — Encounter: Payer: Self-pay | Admitting: Family Medicine

## 2021-12-02 ENCOUNTER — Encounter: Payer: Self-pay | Admitting: Family Medicine

## 2021-12-02 ENCOUNTER — Ambulatory Visit (INDEPENDENT_AMBULATORY_CARE_PROVIDER_SITE_OTHER): Payer: Managed Care, Other (non HMO) | Admitting: Family Medicine

## 2021-12-02 VITALS — BP 123/77 | HR 80 | Ht 74.0 in | Wt 233.0 lb

## 2021-12-02 DIAGNOSIS — R5383 Other fatigue: Secondary | ICD-10-CM | POA: Diagnosis not present

## 2021-12-02 LAB — IBC PANEL
Iron: 166 ug/dL — ABNORMAL HIGH (ref 42–165)
Saturation Ratios: 47.4 % (ref 20.0–50.0)
TIBC: 350 ug/dL (ref 250.0–450.0)
Transferrin: 250 mg/dL (ref 212.0–360.0)

## 2021-12-02 LAB — TESTOSTERONE: Testosterone: 349.97 ng/dL (ref 300.00–890.00)

## 2021-12-02 LAB — VITAMIN B12: Vitamin B-12: 374 pg/mL (ref 211–911)

## 2021-12-02 NOTE — Patient Instructions (Signed)
Blood pressure is well controlled without amlodipine - continue to hold off on this since it was causing your dizzy spells.  Focus on healthy diet, exercise, hydration, and using CPAP every night.  Adding labs to check for other causes of your fatigue.

## 2021-12-02 NOTE — Progress Notes (Signed)
Acute Office Visit  Subjective:    Patient ID: Allen Sawyer, male    DOB: November 05, 1960, 61 y.o.   MRN: [id number]  CC: fatigue    HPI Patient is in today for fatigue.   Patient was seen on 11/22/2021 for dizziness after starting amlodipine.  He was told to stop for 2 weeks and monitor blood pressure at home.  States he has had no more dizzy episodes since stopping amlodipine but has not been checking his blood pressure regularly.  Denies any headaches, vision changes, chest pain.  He is also reporting some sinus-like symptoms at the time but reports sinuses are doing much better now and is no longer an issue.  He is mostly here because he is concerned that for the past 4 weeks or so he has been feeling more fatigued than usual.  States every afternoon around 2 PM he will feel very tired.  He just recently started using his CPAP 4 nights ago and he does think that is helping somewhat.  He did have some neck pain the past 3 nights that kept him up from sleeping but it has resolved now so is hoping he will sleep better tonight.  He is very concerned because he is normally very active and now with his fatigue he does not even want to go to the gym to exercise.  Reports he has a family history of iron deficiency.  He denies any weight change, chest pain, shortness of breath, headaches, vision changes, dizziness/lightheadedness, GI/GU symptoms.    Past Medical History:  Diagnosis Date   Sleep apnea     Past Surgical History:  Procedure Laterality Date   APPENDECTOMY      Family History  Problem Relation Age of Onset   Hyperlipidemia Mother    Hypertension Mother    Hyperlipidemia Father    Hypertension Father    Heart attack Father    Hyperlipidemia Sister    Hypertension Sister    Hypertension Brother    Hyperlipidemia Brother    Diabetes Brother    Heart attack Paternal Uncle    Sudden death Neg Hx     Social History   Socioeconomic History   Marital status: Married     Spouse name: Not on file   Number of children: Not on file   Years of education: Not on file   Highest education level: Not on file  Occupational History   Not on file  Tobacco Use   Smoking status: Former   Smokeless tobacco: Never  Vaping Use   Vaping Use: Never used  Substance and Sexual Activity   Alcohol use: Yes    Comment: occ   Drug use: No   Sexual activity: Not on file  Other Topics Concern   Not on file  Social History Narrative   Not on file   Social Determinants of Health   Financial Resource Strain: Not on file  Food Insecurity: Not on file  Transportation Needs: Not on file  Physical Activity: Not on file  Stress: Not on file  Social Connections: Not on file  Intimate Partner Violence: Not on file    Outpatient Medications Prior to Visit  Medication Sig Dispense Refill   amLODipine (NORVASC) 5 MG tablet Take 1 tablet (5 mg total) by mouth daily. (Patient not taking: Reported on 12/02/2021) 30 tablet 2   fluticasone (FLONASE) 50 MCG/ACT nasal spray Place 2 sprays into both nostrils daily. (Patient not taking: Reported on 12/02/2021) 18 mL 2  levocetirizine (XYZAL) 5 MG tablet Take 1 tablet (5 mg total) by mouth every evening. (Patient not taking: Reported on 12/02/2021) 90 tablet 1   No facility-administered medications prior to visit.    No Known Allergies  Review of Systems All review of systems negative except what is listed in the HPI     Objective:    Physical Exam Vitals reviewed.  Constitutional:      Appearance: Normal appearance.  Cardiovascular:     Rate and Rhythm: Normal rate and regular rhythm.  Pulmonary:     Effort: Pulmonary effort is normal.     Breath sounds: Normal breath sounds.  Skin:    General: Skin is warm and dry.  Neurological:     General: No focal deficit present.     Mental Status: He is alert and oriented to person, place, and time. Mental status is at baseline.  Psychiatric:        Mood and Affect: Mood normal.         Behavior: Behavior normal.        Thought Content: Thought content normal.        Judgment: Judgment normal.    BP 123/77    Pulse 80    Ht 6\' 2"  (1.88 m)    Wt 233 lb (105.7 kg)    BMI 29.92 kg/m  Wt Readings from Last 3 Encounters:  12/02/21 233 lb (105.7 kg)  11/22/21 232 lb 12.8 oz (105.6 kg)  10/06/21 238 lb 2 oz (108 kg)    Health Maintenance Due  Topic Date Due   COLONOSCOPY (Pts 45-64yrs Insurance coverage will need to be confirmed)  Never done   Zoster Vaccines- Shingrix (1 of 2) Never done   COVID-19 Vaccine (3 - Booster for Pfizer series) 03/30/2020    There are no preventive care reminders to display for this patient.   Lab Results  Component Value Date   TSH 2.30 11/22/2021   Lab Results  Component Value Date   WBC 4.5 11/22/2021   HGB 14.5 11/22/2021   HCT 43.8 11/22/2021   MCV 97.4 11/22/2021   PLT 165.0 11/22/2021   Lab Results  Component Value Date   NA 141 11/22/2021   K 4.5 11/22/2021   CO2 31 11/22/2021   GLUCOSE 102 (H) 11/22/2021   BUN 20 11/22/2021   CREATININE 1.24 11/22/2021   BILITOT 0.5 11/22/2021   ALKPHOS 62 11/22/2021   AST 23 11/22/2021   ALT 21 11/22/2021   PROT 7.5 11/22/2021   ALBUMIN 4.3 11/22/2021   CALCIUM 9.9 11/22/2021   GFR 64.07 11/22/2021   Lab Results  Component Value Date   CHOL 193 09/04/2020   Lab Results  Component Value Date   HDL 63 09/04/2020   Lab Results  Component Value Date   LDLCALC 108 (H) 09/04/2020   Lab Results  Component Value Date   TRIG 108 09/04/2020   Lab Results  Component Value Date   CHOLHDL 3.1 09/04/2020   No results found for: HGBA1C     Assessment & Plan:   1. Fatigue, unspecified type Blood pressure is well controlled without amlodipine - continue to hold off on this since it was causing your dizzy spells. Monitor BP at home and let 13/02/2020 know if staying >140/80. Focus on healthy diet, exercise, hydration, and using CPAP every night.  Adding labs to check for other  causes of your fatigue.   - IBC panel - B12 - Testosterone    Follow-up  pending results    Clayborne Danaaylor B Raffaela Ladley, NP

## 2021-12-12 ENCOUNTER — Emergency Department (HOSPITAL_BASED_OUTPATIENT_CLINIC_OR_DEPARTMENT_OTHER)
Admission: EM | Admit: 2021-12-12 | Discharge: 2021-12-12 | Disposition: A | Discharge status: Home / Self Care | Payer: Managed Care, Other (non HMO) | Attending: Emergency Medicine | Admitting: Emergency Medicine

## 2021-12-12 ENCOUNTER — Encounter (HOSPITAL_BASED_OUTPATIENT_CLINIC_OR_DEPARTMENT_OTHER): Payer: Self-pay | Admitting: Emergency Medicine

## 2021-12-12 ENCOUNTER — Other Ambulatory Visit: Payer: Self-pay

## 2021-12-12 DIAGNOSIS — R0981 Nasal congestion: Secondary | ICD-10-CM | POA: Insufficient documentation

## 2021-12-12 MED ORDER — DEXAMETHASONE 4 MG PO TABS
10.0000 mg | ORAL_TABLET | Freq: Once | ORAL | Status: AC
  Administered 2021-12-12: 10 mg via ORAL
  Filled 2021-12-12: qty 3

## 2021-12-12 NOTE — ED Notes (Signed)
Pt refuses Nasal swab stating "It takes me out of work for 7 days automatically" and that he gets sinus infections every year around this time and "he"s sure this is what it is."

## 2021-12-12 NOTE — ED Provider Notes (Signed)
[facility] EMERGENCY DEPARTMENT Provider Note   CSN: [id number] Arrival date & time: 12/12/21  1418     History  Chief Complaint  Patient presents with   Nasal Congestion    Allen Sawyer is a 61 y.o. male.  The history is provided by the patient.  URI Presenting symptoms: congestion   Congestion:    Location:  Nasal Severity:  Mild Onset quality:  Gradual Duration:  1 day Timing:  Constant Chronicity:  Recurrent Relieved by:  Nothing Worsened by:  Nothing Associated symptoms: no arthralgias, no headaches, no myalgias, no neck pain, no sinus pain, no sneezing, no swollen glands and no wheezing   Risk factors: no sick contacts       Home Medications Prior to Admission medications   Medication Sig Start Date End Date Taking? Authorizing Provider  amLODipine (NORVASC) 5 MG tablet Take 1 tablet (5 mg total) by mouth daily. Patient not taking: Reported on 12/02/2021 10/29/21 01/27/22  Theadora Rama Scales, PA-C  fluticasone [facility]) 50 MCG/ACT nasal spray Place 2 sprays into both nostrils daily. Patient not taking: Reported on 12/02/2021 10/29/21   Theadora Rama Scales, PA-C  levocetirizine (XYZAL) 5 MG tablet Take 1 tablet (5 mg total) by mouth every evening. Patient not taking: Reported on 12/02/2021 11/22/21   Clayborne Dana, NP      Allergies    Patient has no known allergies.    Review of Systems   Review of Systems  HENT:  Positive for congestion. Negative for sinus pain and sneezing.   Respiratory:  Negative for wheezing.   Musculoskeletal:  Negative for arthralgias, myalgias and neck pain.  Neurological:  Negative for headaches.   Physical Exam Updated Vital Signs BP (!) 151/74 (BP Location: Left Arm)    Pulse 76    Temp 98.3 F (36.8 C) (Oral)    Resp 20    SpO2 100%  Physical Exam Vitals and nursing note reviewed.  Constitutional:      General: He is not in acute distress.    Appearance: He is well-developed.  HENT:     Head: Normocephalic  and atraumatic.     Nose: Congestion present.  Eyes:     Conjunctiva/sclera: Conjunctivae normal.  Cardiovascular:     Rate and Rhythm: Normal rate and regular rhythm.     Heart sounds: No murmur heard. Pulmonary:     Effort: Pulmonary effort is normal. No respiratory distress.     Breath sounds: Normal breath sounds.  Abdominal:     Palpations: Abdomen is soft.     Tenderness: There is no abdominal tenderness.  Musculoskeletal:        General: No swelling.     Cervical back: Neck supple.  Skin:    General: Skin is warm and dry.     Capillary Refill: Capillary refill takes less than 2 seconds.  Neurological:     Mental Status: He is alert.  Psychiatric:        Mood and Affect: Mood normal.    ED Results / Procedures / Treatments   Labs (all labs ordered are listed, but only abnormal results are displayed) Labs Reviewed - No data to display  EKG None  Radiology No results found.  Procedures Procedures    Medications Ordered in ED Medications  dexamethasone (DECADRON) tablet 10 mg (has no administration in time range)    ED Course/ Medical Decision Making/ A&P  Medical Decision Making Risk Prescription drug management.   Allen Sawyer is here for nasal congestion.  History of the same/allergies.  Normal vitals.  No fever.  Denies any other infectious symptoms.  No sick contacts.  Nasal congestion started yesterday after working in dusty area.  Feels like his allergies have flared.  Differential diagnosis includes allergy versus viral process.  Patient did not want viral testing.  Was given a dose of Decadron.  Overall appears well.  Discharged in good condition.  This chart was dictated using voice recognition software.  Despite best efforts to proofread,  errors can occur which can change the documentation meaning.         Final Clinical Impression(s) / ED Diagnoses Final diagnoses:  Nasal congestion    Rx / DC Orders ED  Discharge Orders     None         Virgina Norfolk, DO 12/12/21 1532

## 2021-12-12 NOTE — ED Triage Notes (Signed)
Pt reports nasal congestion and runny nose. Hx of recurrent sinusitis this time of year and symptoms feel like they normally do with sinus infection. Denies cough, sore throat, or anything besides nasal symptoms.

## 2021-12-21 NOTE — Progress Notes (Unsigned)
NEW PATIENT Date of Service/Encounter:  12/21/21 Referring provider: Clayborne Dana, NP Primary care provider: Sharlene Dory, DO  Subjective:  Allen Sawyer is a 61 y.o. male with a PMHx of cervical radiculopathy presenting today for evaluation of chronic rhinitis. History obtained from: chart review and {Persons; PED relatives w/patient:19415::"patient"}.   Chronic rhinitis: started *** Symptoms include: {Blank multiple:19196:a:"***","nasal congestion","rhinorrhea","post nasal drainage","sneezing","watery eyes","itchy eyes","itchy nose"}  Occurs {Blank single:19197::"year-round","seasonally-***","year-round with seasonal flares","***"} Potential triggers: *** Treatments tried: xyzal, flonase Previous allergy testing: {Blank single:19197::"yes","no"} History of reflux/heartburn: {Blank single:19197::"yes","no"}  Seen in ED on 12/12/21 for nasal congestion-viral vs allergy, patient refused viral testing, given dose of decadron AEC 11/22/21: 100  Other allergy screening: Asthma: {Blank single:19197::"yes","no"} Rhino conjunctivitis: {Blank single:19197::"yes","no"} Food allergy: {Blank single:19197::"yes","no"} Medication allergy: {Blank single:19197::"yes","no"} Hymenoptera allergy: {Blank single:19197::"yes","no"} Urticaria: {Blank single:19197::"yes","no"} Eczema:{Blank single:19197::"yes","no"} History of recurrent infections suggestive of immunodeficency: {Blank single:19197::"yes","no"} ***Vaccinations are up to date.   Past Medical History: Past Medical History:  Diagnosis Date   Sleep apnea    Medication List:  Current Outpatient Medications  Medication Sig Dispense Refill   amLODipine (NORVASC) 5 MG tablet Take 1 tablet (5 mg total) by mouth daily. (Patient not taking: Reported on 12/02/2021) 30 tablet 2   fluticasone (FLONASE) 50 MCG/ACT nasal spray Place 2 sprays into both nostrils daily. (Patient not taking: Reported on 12/02/2021) 18 mL 2   levocetirizine  (XYZAL) 5 MG tablet Take 1 tablet (5 mg total) by mouth every evening. (Patient not taking: Reported on 12/02/2021) 90 tablet 1   No current facility-administered medications for this visit.   Known Allergies:  No Known Allergies Past Surgical History: Past Surgical History:  Procedure Laterality Date   APPENDECTOMY     Family History: Family History  Problem Relation Age of Onset   Hyperlipidemia Mother    Hypertension Mother    Hyperlipidemia Father    Hypertension Father    Heart attack Father    Hyperlipidemia Sister    Hypertension Sister    Hypertension Brother    Hyperlipidemia Brother    Diabetes Brother    Heart attack Paternal Uncle    Sudden death Neg Hx    Social History: [employer] ***.   ROS:  All other systems negative except as noted per HPI.  Objective:  There were no vitals taken for this visit. There is no height or weight on file to calculate BMI. Physical Exam:  General Appearance:  Alert, cooperative, no distress, appears stated age  Head:  Normocephalic, without obvious abnormality, atraumatic  Eyes:  Conjunctiva clear, EOM's intact  Nose: Nares normal, {Blank multiple:19196:a:"***","hypertrophic turbinates","normal mucosa","no visible anterior polyps","septum midline"}  Throat: Lips, tongue normal; teeth and gums normal, {Blank multiple:19196:a:"***","normal posterior oropharynx","tonsils 2+","tonsils 3+","no tonsillar exudate","+ cobblestoning"}  Neck: Supple, symmetrical  Lungs:   {Blank multiple:19196:a:"***","clear to auscultation bilaterally","end-expiratory wheezing","wheezing throughout"}, Respirations unlabored, {Blank multiple:19196:a:"***","no coughing","intermittent dry coughing"}  Heart:  {Blank multiple:19196:a:"***","regular rate and rhythm","no murmur"}, Appears well perfused  Extremities: No edema  Skin: Skin color, texture, turgor normal, no rashes or lesions on visualized portions of skin  Neurologic: No gross deficits      Diagnostics: Spirometry:  Tracings reviewed. His effort: {Blank single:19197::"Good reproducible efforts.","It was hard to get consistent efforts and there is a question as to whether this reflects a maximal maneuver.","Poor effort, data can not be interpreted.","Variable effort-results affected.","decent for first attempt at spirometry."} FVC: ***L (pre), ***L  (post) FEV1: ***L, ***% predicted (pre), ***L, ***% predicted (post) FEV1/FVC ratio: ***% (pre), ***% (post)  Interpretation: {Blank single:19197::"Spirometry consistent with mild obstructive disease","Spirometry consistent with moderate obstructive disease","Spirometry consistent with severe obstructive disease","Spirometry consistent with possible restrictive disease","Spirometry consistent with mixed obstructive and restrictive disease","Spirometry uninterpretable due to technique","Spirometry consistent with normal pattern","No overt abnormalities noted given today's efforts"} with *** bronchodilator response  Skin Testing: {Blank single:19197::"Select foods","Environmental allergy panel","Environmental allergy panel and select foods","Food allergy panel","None","Deferred due to recent antihistamines use"}. *** Adequate controls. Results discussed with patient/family.   {Blank single:19197::"Allergy testing results were read and interpreted by myself, documented by clinical staff."," "}  Assessment and Plan  There are no Patient Instructions on file for this visit.  {Blank single:19197::"This note in its entirety was forwarded to the Provider who requested this consultation."}  Thank you for your kind referral. I appreciate the opportunity to take part in Domenique's care. Please do not hesitate to contact me with questions.***  Sincerely,  Tonny Bollman, MD [facility]

## 2021-12-23 ENCOUNTER — Ambulatory Visit: Payer: Self-pay | Admitting: Internal Medicine

## 2021-12-30 ENCOUNTER — Telehealth: Payer: Self-pay | Admitting: Family Medicine

## 2021-12-30 ENCOUNTER — Ambulatory Visit: Payer: Managed Care, Other (non HMO) | Admitting: Family Medicine

## 2021-12-30 ENCOUNTER — Encounter: Payer: Self-pay | Admitting: Family Medicine

## 2021-12-30 VITALS — BP 137/86 | HR 72 | Ht 74.0 in | Wt 229.8 lb

## 2021-12-30 DIAGNOSIS — M25512 Pain in left shoulder: Secondary | ICD-10-CM | POA: Diagnosis not present

## 2021-12-30 MED ORDER — MELOXICAM 15 MG PO TABS: 15.0000 mg | ORAL_TABLET | Freq: Every day | ORAL | 0 refills | Status: DC

## 2021-12-30 NOTE — Patient Instructions (Signed)
Shoulder pain - likely rotator cuff teninopathy: ?Meloxicam (do not take with other NSAIDs like ibuprofen or Aleve) daily for 2 weeks, then as needed ?Rest, ice, massage, stretching ?Home exercises - handout provided ? ?Please contact office for follow-up if symptoms do not improve or worsen. Seek emergency care if symptoms become severe. ? ?

## 2021-12-30 NOTE — Telephone Encounter (Signed)
Please advise 

## 2021-12-30 NOTE — Telephone Encounter (Signed)
Patient would like to transfer ? ?From: [place] ?To: [facility] ? ? ?Please advise ? ?

## 2021-12-30 NOTE — Progress Notes (Signed)
? ?Acute Office Visit ? ?Subjective:  ? ? Patient ID: Allen Sawyer, male    DOB: [birth year], 61 y.o.   MRN: [id number] ? ?CC: left shoulder pain ? ? ?HPI ?Patient is in today for left shoulder pain ? 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 ?Reports he is still able to go to the gym and lift weights, just not as heavy as usual. Overhead motions such as shoulder press are most painful. When he has a few days off of work, pain will almost go away completely and then return after repetitive motions at work. He does hear occasional clicking. He is requesting a few days off of work to let it heal.  ? ? ? ? ?Past Medical History:  ?Diagnosis Date  ? Sleep apnea   ? ? ?Past Surgical History:  ?Procedure Laterality Date  ? APPENDECTOMY    ? ? ?Family History  ?Problem Relation Age of Onset  ? Hyperlipidemia Mother   ? Hypertension Mother   ? Hyperlipidemia Father   ? Hypertension Father   ? Heart attack Father   ? Hyperlipidemia Sister   ? Hypertension Sister   ? Hypertension Brother   ? Hyperlipidemia Brother   ? Diabetes Brother   ? Heart attack Paternal Uncle   ? Sudden death Neg Hx   ? ? ?Social History  ? ?Socioeconomic History  ? Marital status: Married  ?  Spouse name: Not on file  ? Number of children: Not on file  ? Years of education: Not on file  ? Highest education level: Not on file  ?Occupational History  ? Not on file  ?Tobacco Use  ? Smoking status: Former  ? Smokeless tobacco: Never  ?Vaping Use  ? Vaping Use: Never used  ?Substance and Sexual Activity  ? Alcohol use: Yes  ?  Comment: occ  ? Drug use: No  ? Sexual activity: Not on file  ?Other Topics  Concern  ? Not on file  ?Social History Narrative  ? Not on file  ? ?Social Determinants of Health  ? ?Financial Resource Strain: Not on file  ?Food Insecurity: Not on file  ?Transportation Needs: Not on file  ?Physical Activity: Not on file  ?Stress: Not on file  ?Social Connections: Not on file  ?Intimate Partner Violence: Not on file  ? ? ?Outpatient Medications Prior to Visit  ?Medication Sig Dispense Refill  ? amLODipine (NORVASC) 5 MG tablet Take 1 tablet (5 mg total) by mouth daily. (Patient not taking: Reported on 12/02/2021) 30 tablet 2  ? fluticasone (FLONASE) 50 MCG/ACT nasal spray Place 2 sprays into both nostrils daily. (Patient not taking: Reported on 12/02/2021) 18 mL 2  ? levocetirizine (XYZAL) 5 MG tablet Take 1 tablet (5 mg total) by mouth every evening. (Patient not taking: Reported on 12/02/2021) 90 tablet 1  ? ?No facility-administered medications prior to visit.  ? ? ?No Known Allergies ? ?Review of Systems ?All review of systems negative except what is listed in the HPI ? ?   ?Objective:  ?  ?Physical Exam ?Vitals reviewed.  ?Constitutional:   ?   Appearance: Normal appearance.  ?HENT:  ?   Head: Normocephalic and atraumatic.  ?Musculoskeletal:     ?   General: Normal  range of motion.  ?   Cervical back: Normal range of motion and neck supple.  ?   Comments: Left shoulder with slight discomfort on Neers, Hawkins, Apley scratch, external rotation; no weakness to resistance, no palpable swelling, bruising; full range of motion   ?Skin: ?   General: Skin is warm and dry.  ?   Findings: No bruising or erythema.  ?Neurological:  ?   General: No focal deficit present.  ?   Mental Status: He is alert and oriented to person, place, and time. Mental status is at baseline.  ?Psychiatric:     ?   Mood and Affect: Mood normal.     ?   Behavior: Behavior normal.     ?   Thought Content: Thought content normal.     ?   Judgment: Judgment normal.  ? ? ?BP 137/86   Pulse 72   Ht 6\' 2"  (1.88 m)   Wt 229 lb 12.8  oz (104.2 kg)   BMI 29.50 kg/m?  ?Wt Readings from Last 3 Encounters:  ?12/30/21 229 lb 12.8 oz (104.2 kg)  ?12/02/21 233 lb (105.7 kg)  ?11/22/21 232 lb 12.8 oz (105.6 kg)  ? ? ?Health Maintenance Due  ?Topic Date Due  ? COLONOSCOPY (Pts 45-1yrs Insurance coverage will need to be confirmed)  Never done  ? Zoster Vaccines- Shingrix (1 of 2) Never done  ? COVID-19 Vaccine (3 - Booster for Pfizer series) 03/30/2020  ? ? ?There are no preventive care reminders to display for this patient. ? ? ?Lab Results  ?Component Value Date  ? TSH 2.30 11/22/2021  ? ?Lab Results  ?Component Value Date  ? WBC 4.5 11/22/2021  ? HGB 14.5 11/22/2021  ? HCT 43.8 11/22/2021  ? MCV 97.4 11/22/2021  ? PLT 165.0 11/22/2021  ? ?Lab Results  ?Component Value Date  ? NA 141 11/22/2021  ? K 4.5 11/22/2021  ? CO2 31 11/22/2021  ? GLUCOSE 102 (H) 11/22/2021  ? BUN 20 11/22/2021  ? CREATININE 1.24 11/22/2021  ? BILITOT 0.5 11/22/2021  ? ALKPHOS 62 11/22/2021  ? AST 23 11/22/2021  ? ALT 21 11/22/2021  ? PROT 7.5 11/22/2021  ? ALBUMIN 4.3 11/22/2021  ? CALCIUM 9.9 11/22/2021  ? GFR 64.07 11/22/2021  ? ?Lab Results  ?Component Value Date  ? CHOL 193 09/04/2020  ? ?Lab Results  ?Component Value Date  ? HDL 63 09/04/2020  ? ?Lab Results  ?Component Value Date  ? LDLCALC 108 (H) 09/04/2020  ? ?Lab Results  ?Component Value Date  ? TRIG 108 09/04/2020  ? ?Lab Results  ?Component Value Date  ? CHOLHDL 3.1 09/04/2020  ? ?No results found for: HGBA1C ? ?   ?Assessment & Plan:  ? ?1. Acute pain of left shoulder ?Shoulder pain - likely rotator cuff teninopathy: ?Meloxicam (do not take with other NSAIDs like ibuprofen or Aleve) daily for 2 weeks, then as needed ?Rest, ice, massage, stretching ?Home exercises - handout provided ?If not improving in 4-6 weeks of conservative measures, consider imaging and/or PT/sports med.  ?Written out of work for a few days to allow rest/healing.  ? ?- meloxicam (MOBIC) 15 MG tablet; Take 1 tablet (15 mg total) by mouth  daily.  Dispense: 30 tablet; Refill: 0 ? ? ?Please contact office for follow-up if symptoms do not improve or worsen. Seek emergency care if symptoms become severe. ? ? ? ?13/02/2020, NP ? ?

## 2021-12-30 NOTE — Progress Notes (Signed)
Left shoulder pain x1 month ?Would like work note til next Mon or Tues ?

## 2021-12-31 NOTE — Telephone Encounter (Signed)
Okay to schedule transfer of care appt with [facility]. Thank you.  ?

## 2022-01-20 ENCOUNTER — Ambulatory Visit: Payer: Managed Care, Other (non HMO) | Admitting: Family Medicine

## 2022-01-20 ENCOUNTER — Encounter: Payer: Self-pay | Admitting: Family Medicine

## 2022-01-20 VITALS — BP 137/83 | HR 74 | Ht 74.0 in | Wt 236.8 lb

## 2022-01-20 DIAGNOSIS — G8929 Other chronic pain: Secondary | ICD-10-CM

## 2022-01-20 DIAGNOSIS — M25511 Pain in right shoulder: Secondary | ICD-10-CM

## 2022-01-20 DIAGNOSIS — M545 Low back pain, unspecified: Secondary | ICD-10-CM

## 2022-01-20 NOTE — Patient Instructions (Signed)
PT referral placed for shoulder and back pain.  ?Start the meloxicam (do not take with other antiinflammatories) ?Home exercises, heat, ice, massage ?Rest for the next 3-4 days - work note provided. ?

## 2022-01-20 NOTE — Progress Notes (Signed)
? ?______________________________________________________________________ ? ?HPI ?Allen Sawyer is a 61 y.o. male presenting to [facility] today to establish care. Transfer of care from Dr. Carmelia Roller.  ? ?Patient Care Team: ?Clayborne Dana, NP as PCP - General (Family Medicine) ? ?Health Maintenance  ?Topic Date Due  ? Colon Cancer Screening  Never done  ? Zoster (Shingles) Vaccine (1 of 2) Never done  ? COVID-19 Vaccine (3 - Booster for Pfizer series) 03/30/2020  ? Flu Shot  01/28/2022*  ? Tetanus Vaccine  09/02/2025  ? Hepatitis C Screening: USPSTF Recommendation to screen - Ages 79-79 yo.  Completed  ? HIV Screening  Completed  ? HPV Vaccine  Aged Out  ?*Topic was postponed. The date shown is not the original due date.  ? ? ? ?Concerns today: ?Chronic left shoulder pain/tendinopathy - work-related; 7/10 with overhead motion and rotation; repetitive motions at work. PT referral requested. Never started meloxicam. ?Chronic low back pain - manageable, no radiating symptoms  ?Hx of elevated BP - stopped his amlodipine because he was getting light headed. Has been working on lifestyle factors. Doing well without it. Not checking BP regularly at home.  ? ? ? ?Patient Active Problem List  ? Diagnosis Date Noted  ? Cervical radiculopathy 07/29/2021  ? Seasonal allergies 03/08/2021  ? Acute midline thoracic back pain 08/19/2020  ? Low back pain 08/22/2011  ? ? ?PHQ9 Today: ? ?  10/06/2021  ?  7:04 AM 05/29/2020  ? 10:04 AM  ?Depression screen PHQ 2/9  ?Decreased Interest 0 0  ?Down, Depressed, Hopeless 0 0  ?PHQ - 2 Score 0 0  ? ? ? ?______________________________________________________________________ ?PMH ?Past Medical History:  ?Diagnosis Date  ? Sleep apnea   ? ? ?ROS ?All review of systems negative except what is listed in the HPI ? ?PHYSICAL EXAM ?Physical Exam ?Vitals reviewed.  ?Constitutional:   ?   Appearance: Normal appearance.  ?HENT:  ?   Head: Normocephalic and atraumatic.   ?Cardiovascular:  ?   Rate and Rhythm: Normal rate and regular rhythm.  ?Pulmonary:  ?   Effort: Pulmonary effort is normal.  ?   Breath sounds: Normal breath sounds.  ?Musculoskeletal:     ?   General: No swelling or tenderness.  ?   Comments: Lumbar spine full ROM; L shoulder with discomfort to Neers, Juanetta Gosling, Apley, external rotation, no weakness   ?Skin: ?   General: Skin is warm and dry.  ?Neurological:  ?   General: No focal deficit present.  ?   Mental Status: He is alert and oriented to person, place, and time. Mental status is at baseline.  ?Psychiatric:     ?   Mood and Affect: Mood normal.     ?   Behavior: Behavior normal.     ?   Thought Content: Thought content normal.     ?   Judgment: Judgment normal.  ? ? ? ?______________________________________________________________________ ?ASSESSMENT AND PLAN ? ?Chronic midline low back pain without sciatica ?Chronic right shoulder pain ?PT referral placed for shoulder and back pain.  ?Start the meloxicam (do not take with other antiinflammatories) ?Home exercises (handout provided), heat, ice, massage ?Rest for the next 3-4 days - work note provided. ?- Ambulatory referral to Physical Therapy ? ?Blood pressure well controlled without medication. Continue lifestyle modifications and monitor.  ? ?Establish care ?Education provided today during visit and on AVS for patient to review at home.  ?Diet and Exercise recommendations provided.  ?Current  diagnoses and recommendations discussed. ?HM recommendations reviewed with recommendations.  ? ? ?Outpatient Encounter Medications as of 01/20/2022  ?Medication Sig  ? amLODipine (NORVASC) 5 MG tablet Take 1 tablet (5 mg total) by mouth daily. (Patient not taking: Reported on 12/02/2021)  ? fluticasone (FLONASE) 50 MCG/ACT nasal spray Place 2 sprays into both nostrils daily. (Patient not taking: Reported on 12/02/2021)  ? levocetirizine (XYZAL) 5 MG tablet Take 1 tablet (5 mg total) by mouth every evening. (Patient not  taking: Reported on 12/02/2021)  ? meloxicam (MOBIC) 15 MG tablet Take 1 tablet (15 mg total) by mouth daily.  ? ?No facility-administered encounter medications on file as of 01/20/2022.  ? ? ?Return in about 3 months (around 04/22/2022) for CPE fasting labs . ? ? ? ?Lollie Marrow Reola Calkins, DNP, FNP-C ? ? ?

## 2022-02-17 ENCOUNTER — Ambulatory Visit: Payer: Managed Care, Other (non HMO) | Admitting: Family Medicine

## 2022-02-17 ENCOUNTER — Encounter: Payer: Self-pay | Admitting: Family Medicine

## 2022-02-17 VITALS — BP 135/78 | HR 80 | Ht 74.0 in | Wt 232.0 lb

## 2022-02-17 DIAGNOSIS — F419 Anxiety disorder, unspecified: Secondary | ICD-10-CM

## 2022-02-17 NOTE — Progress Notes (Signed)
? ?  Acute Office Visit ? ?Subjective:  ? ?  ?Patient ID: Allen Sawyer, male    DOB: 1961-05-19, 61 y.o.   MRN: [id number] ? ?CC: anxiety ? ? ?HPI ?Patient is in today for stress/anxiety.  ? ? 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  Reports that he had a flood of thoughts regarding several loss of loved ones including his mom and best friend, brother and sister with cancer diagnoses, etc.  States that he usually does not talk about it much and feels like he may have been bottling it up and reached his max today.  States that usually he does fine on a daily basis however he believes that since he was up last night drinking and had a slight hangover this may have made his emotions worse today.  States that he was able to leave work early and go home and take a 2-hour nap.  Reports that now he is feeling great and does not think he needs any interventions.  He denies any SI/HI.  He is not interested in medications or counseling.  Reports he has a good support system and is doing good overall. ? ?Reports he will occasionally have episodes like this, but only tends to happen if he is overly tired.  States he does not have any anxiety/depression at baseline.  Reports he has a lot of friends, good social life, etc. ? ? ? ? ? ?ROS ?All review of systems negative except what is listed in the HPI ? ? ?   ?Objective:  ?  ?BP 135/78   Pulse 80   Ht 6\' 2"  (1.88 m)   Wt 232 lb (105.2 kg)   BMI 29.79 kg/m?  ? ? ?Physical Exam ?Vitals reviewed.  ?Constitutional:   ?   Appearance: Normal appearance. He is normal weight.  ?HENT:  ?   Head: Normocephalic and atraumatic.  ?Cardiovascular:  ?   Rate and Rhythm: Normal rate and regular rhythm.  ?Pulmonary:  ?   Effort: Pulmonary effort is normal.  ?   Breath sounds: Normal breath sounds.  ?Neurological:  ?   General: No focal deficit present.  ?   Mental Status: He is alert and  oriented to person, place, and time. Mental status is at baseline.  ?Psychiatric:     ?   Mood and Affect: Mood normal.     ?   Behavior: Behavior normal.     ?   Thought Content: Thought content normal.     ?   Judgment: Judgment normal.  ? ? ?No results found for any visits on 02/17/22. ? ? ?   ?Assessment & Plan:  ? ? ?1. Anxiety ?Patient reports he is doing much better now, he just needed someone to talk to and time to catch up on sleep.  He declines any medication/counseling at this time.  Emotional support provided.  No SI/HI.  Encouraged adequate sleep, exercise, reduction of alcohol intake. ?Patient aware of signs/symptoms requiring further/urgent evaluation.  ? ? ?Return if symptoms worsen or fail to improve. ? ?02/19/22, NP ? ? ?

## 2022-02-17 NOTE — Patient Instructions (Signed)
Glad you are feeling better! ?Continue exercise, getting adequate sleep/hydration/food, spending time with support people.  ?If anything changes, let us know.  ?

## 2022-04-13 ENCOUNTER — Ambulatory Visit: Payer: Managed Care, Other (non HMO) | Admitting: Family Medicine

## 2022-04-13 ENCOUNTER — Encounter: Payer: Self-pay | Admitting: Family Medicine

## 2022-04-13 VITALS — BP 142/60 | Ht 74.0 in | Wt 232.0 lb

## 2022-04-13 DIAGNOSIS — M778 Other enthesopathies, not elsewhere classified: Secondary | ICD-10-CM

## 2022-04-13 NOTE — Assessment & Plan Note (Signed)
Acute on chronic in nature.  Symptoms are most consistent with a capsular tightness within the anterior portion of the shoulder. -Counseled on home exercise therapy and supportive care -Could consider physical therapy or injection. -Provided work note.

## 2022-04-13 NOTE — Patient Instructions (Signed)
Good to see you Please try heat.   Please try the exercises. Please send me a message in MyChart with any questions or updates.  Please see me back in 4 to 6 weeks..   --Dr. Raeford Razor

## 2022-04-13 NOTE — Progress Notes (Signed)
  Marin Milley - 61 y.o. male MRN [id number]  Date of birth: 1961-09-15  SUBJECTIVE:  Including CC & ROS.  No chief complaint on file.   Zedekiah Hinderman is a 61 y.o. male that is presenting with acute on chronic left shoulder pain.  The pain is anterior nature.  It is worse with repetitive activities as well as lifting.  No history of similar pain.  Has tried medications.  He does do repetitive activity at work.  Localized to the anterior aspect of the shoulder..  Review of the note from 3/2 shows he was provided meloxicam.   Review of Systems See HPI   HISTORY: Past Medical, Surgical, Social, and Family History Reviewed & Updated per EMR.   Pertinent Historical Findings include:  Past Medical History:  Diagnosis Date   Sleep apnea     Past Surgical History:  Procedure Laterality Date   APPENDECTOMY       PHYSICAL EXAM:  VS: BP (!) 142/60 (BP Location: Right Arm, Patient Position: Sitting)   Ht 6\' 2"  (1.88 m)   Wt 232 lb (105.2 kg)   BMI 29.79 kg/m  Physical Exam Gen: NAD, alert, cooperative with exam, well-appearing MSK:  Neurovascularly intact       ASSESSMENT & PLAN:   Capsulitis of left shoulder Acute on chronic in nature.  Symptoms are most consistent with a capsular tightness within the anterior portion of the shoulder. -Counseled on home exercise therapy and supportive care -Could consider physical therapy or injection. -Provided work note.

## 2022-04-26 ENCOUNTER — Encounter: Payer: Self-pay | Admitting: Family Medicine

## 2022-04-26 ENCOUNTER — Ambulatory Visit: Payer: Managed Care, Other (non HMO) | Admitting: Family Medicine

## 2022-04-26 VITALS — BP 139/69 | HR 83 | Ht 74.0 in | Wt 234.6 lb

## 2022-04-26 DIAGNOSIS — J0140 Acute pansinusitis, unspecified: Secondary | ICD-10-CM

## 2022-04-26 MED ORDER — AZITHROMYCIN 250 MG PO TABS: ORAL_TABLET | ORAL | 0 refills | Status: DC

## 2022-04-26 NOTE — Progress Notes (Signed)
   Acute Office Visit  Subjective:     Patient ID: Allen Sawyer, male    DOB: 04-03-1961, 61 y.o.   MRN: [id number]  CC: congestion/drainage   HPI Patient is in today for congestion/drainage  Patient reports he has been feeling a little bit off for the past 2 weeks.  He went to sports medicine last week and the nurse made a comment that he felt clammy, but he has not checked his temperature.  States he was having his allergies flareup several weeks ago and then over the past 2 weeks he has noticed some occasional chills, mild headaches, raspy voice, increased postnasal drainage, rhinorrhea, waking up with sinus pressure/congestion.  He denies any coughing.  He has gotten minimal improvement with Claritin in the past 2 days.  He denies any chest pain, dyspnea, wheezing, rashes, GI/GU symptoms.     ROS All review of systems negative except what is listed in the HPI      Objective:    BP 139/69   Pulse 83   Ht 6\' 2"  (1.88 m)   Wt 234 lb 9.6 oz (106.4 kg)   BMI 30.12 kg/m    Physical Exam Vitals reviewed.  Constitutional:      Appearance: Normal appearance.  HENT:     Head: Normocephalic and atraumatic.     Right Ear: Tympanic membrane normal.     Left Ear: Tympanic membrane normal.     Nose: Nose normal.     Mouth/Throat:     Mouth: Mucous membranes are moist.     Pharynx: Oropharynx is clear.  Eyes:     Conjunctiva/sclera: Conjunctivae normal.  Cardiovascular:     Rate and Rhythm: Normal rate and regular rhythm.  Pulmonary:     Effort: Pulmonary effort is normal.     Breath sounds: Normal breath sounds.  Musculoskeletal:     Cervical back: Normal range of motion and neck supple.  Skin:    General: Skin is warm and dry.  Neurological:     General: No focal deficit present.     Mental Status: He is alert and oriented to person, place, and time.  Psychiatric:        Mood and Affect: Mood normal.        Behavior: Behavior normal.        Thought Content: Thought  content normal.        Judgment: Judgment normal.     No results found for any visits on 04/26/22.      Assessment & Plan:   1. Acute non-recurrent pansinusitis Zpak prescribed Continue allergy medicine and start daily Flonase nose spray.  Continue supportive measures including rest, hydration, humidifier use, steam showers, warm compresses to sinuses, warm liquids with lemon and honey, and over-the-counter cough, cold, and analgesics as needed.    - azithromycin (ZITHROMAX Z-PAK) 250 MG tablet; Take 2 tablets (500 mg) on  Day 1,  followed by 1 tablet (250 mg) once daily on Days 2 through 5.  Dispense: 6 tablet; Refill: 0   Return if symptoms worsen or fail to improve.  Clayborne Dana, NP

## 2022-05-11 ENCOUNTER — Encounter: Payer: Self-pay | Admitting: Family Medicine

## 2022-05-11 ENCOUNTER — Telehealth: Payer: Self-pay | Admitting: Family Medicine

## 2022-05-11 ENCOUNTER — Ambulatory Visit: Payer: Managed Care, Other (non HMO) | Admitting: Family Medicine

## 2022-05-11 ENCOUNTER — Other Ambulatory Visit: Payer: Self-pay | Admitting: *Deleted

## 2022-05-11 VITALS — BP 148/88 | Ht 74.0 in | Wt 234.0 lb

## 2022-05-11 DIAGNOSIS — Z1211 Encounter for screening for malignant neoplasm of colon: Secondary | ICD-10-CM

## 2022-05-11 DIAGNOSIS — M778 Other enthesopathies, not elsewhere classified: Secondary | ICD-10-CM | POA: Diagnosis not present

## 2022-05-11 NOTE — Patient Instructions (Signed)
Good to see you Please use heat as needed  Please continue the exercises  Please continue to warm up before working out  Please send me a message in MyChart with any questions or updates.  Please see me back as needed.   --Dr. Jordan Likes

## 2022-05-11 NOTE — Assessment & Plan Note (Signed)
Doing well with modalities today. -Counseled on home exercise therapy and supportive care. -Could consider physical therapy or further imaging.

## 2022-05-11 NOTE — Telephone Encounter (Signed)
Patient came in stating he is due for a colonoscopy   Patient would like a referral to get another one done. He doesn't remember the original place.  Please advise

## 2022-05-11 NOTE — Telephone Encounter (Signed)
Referral placed.

## 2022-05-11 NOTE — Progress Notes (Signed)
  Allen Sawyer - 61 y.o. male MRN [id number]  Date of birth: [birth year]  SUBJECTIVE:  Including CC & ROS.  No chief complaint on file.   Allen Sawyer is a 61 y.o. male that is following up for his left shoulder pain.  He has been doing well with conservative measures thus far.  Pain is minimal and he is able to do his normal exercises.   Review of Systems See HPI   HISTORY: Past Medical, Surgical, Social, and Family History Reviewed & Updated per EMR.   Pertinent Historical Findings include:  Past Medical History:  Diagnosis Date   Sleep apnea     Past Surgical History:  Procedure Laterality Date   APPENDECTOMY       PHYSICAL EXAM:  VS: BP (!) 148/88 (BP Location: Left Arm, Patient Position: Sitting)   Ht 6\' 2"  (1.88 m)   Wt 234 lb (106.1 kg)   BMI 30.04 kg/m  Physical Exam Gen: NAD, alert, cooperative with exam, well-appearing MSK:  Neurovascularly intact       ASSESSMENT & PLAN:   Capsulitis of left shoulder Doing well with modalities today. -Counseled on home exercise therapy and supportive care. -Could consider physical therapy or further imaging.

## 2022-05-11 NOTE — Progress Notes (Signed)
Referral placed per pt request 

## 2022-05-23 IMAGING — US US EXTREM LOW VENOUS*R*
1 series · 14 of 24 positions shown · non-contrast
Comparison: None.

CLINICAL DATA: Right leg pain over the last week.

EXAM:
Right LOWER EXTREMITY VENOUS DOPPLER ULTRASOUND
TECHNIQUE: Gray-scale sonography with compression, as well as color and duplex
ultrasound, were performed to evaluate the deep venous system(s)
from the level of the common femoral vein through the popliteal and
proximal calf veins.

[Series 1: us extrem low venous*right* · 14 of 38 slices shown]
[im 1/38]
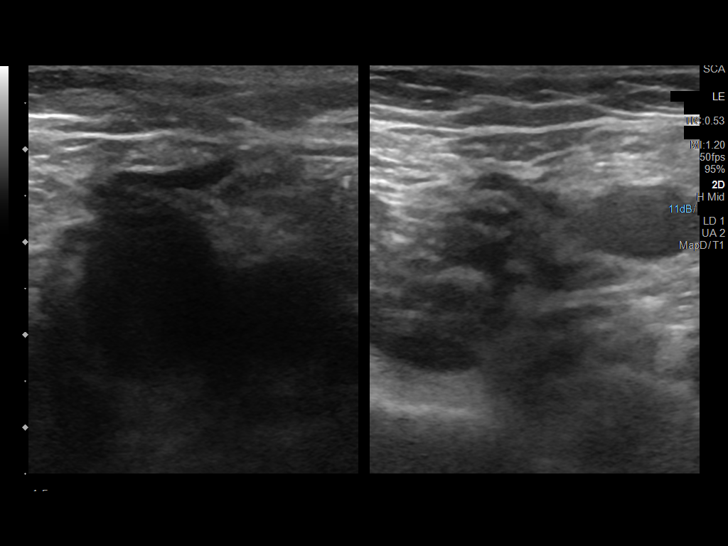
[im 4/38]
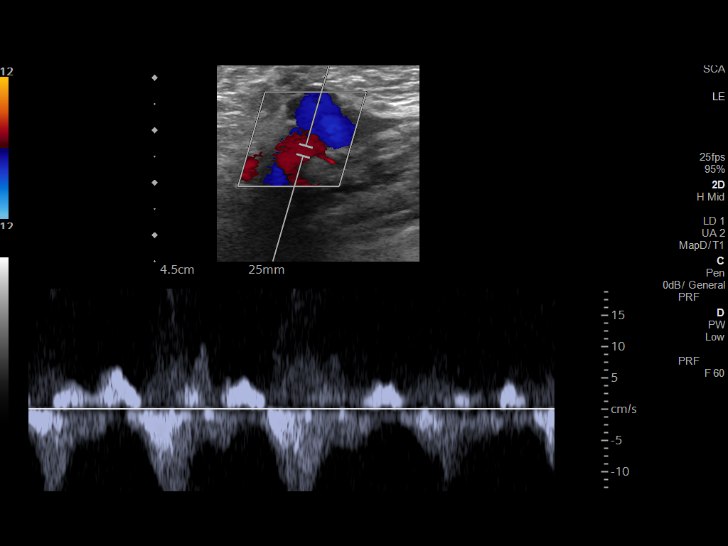
[im 7/38]
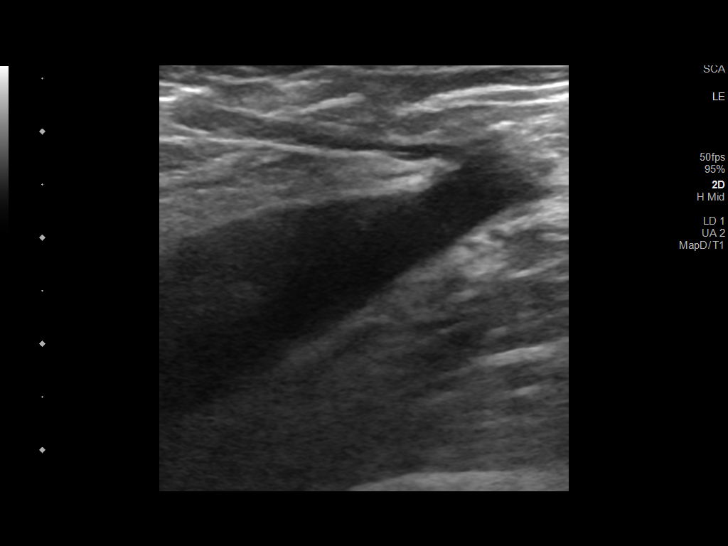
[im 10/38]
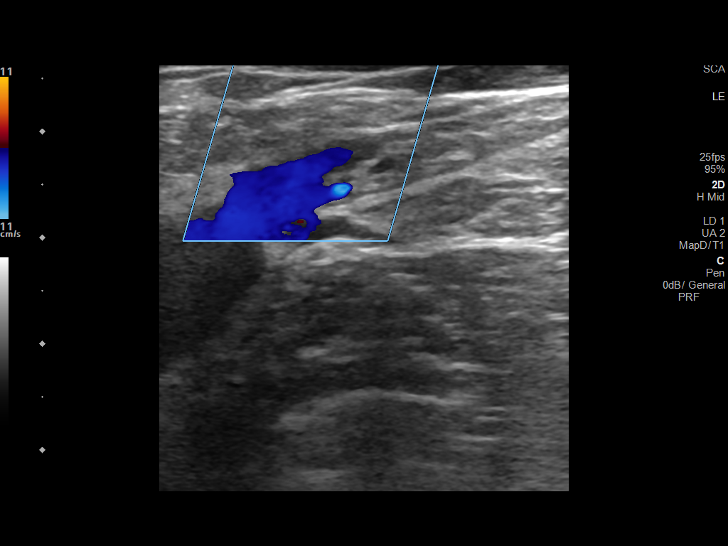
[im 12/38]
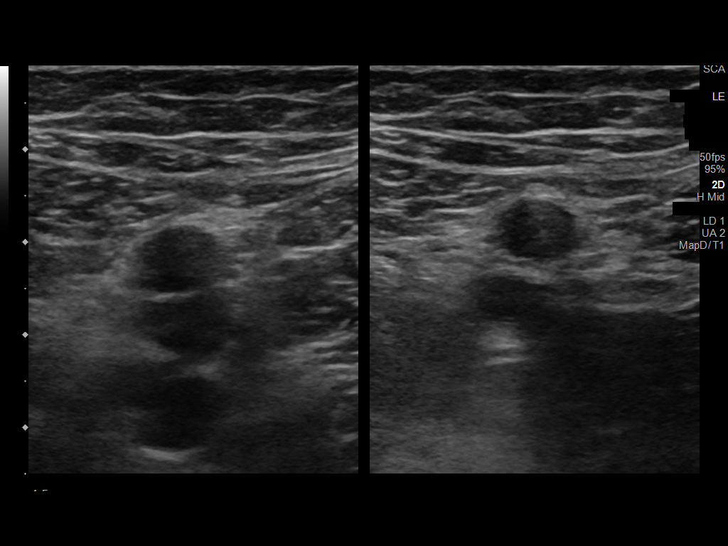
[im 15/38]
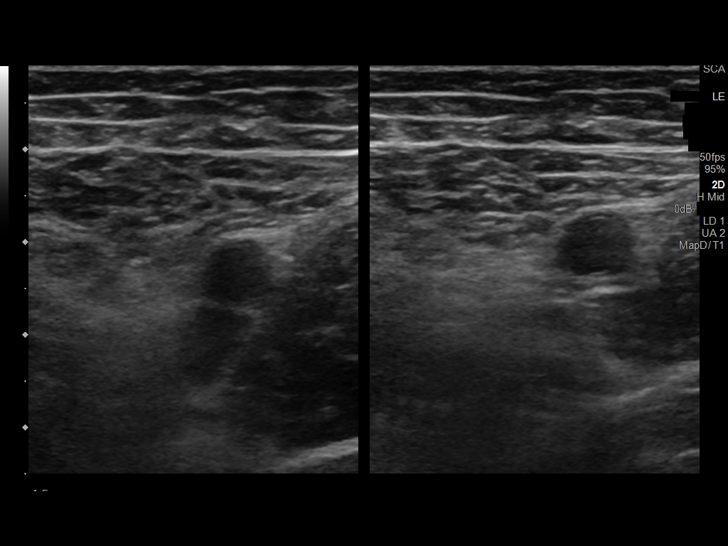
[im 18/38]
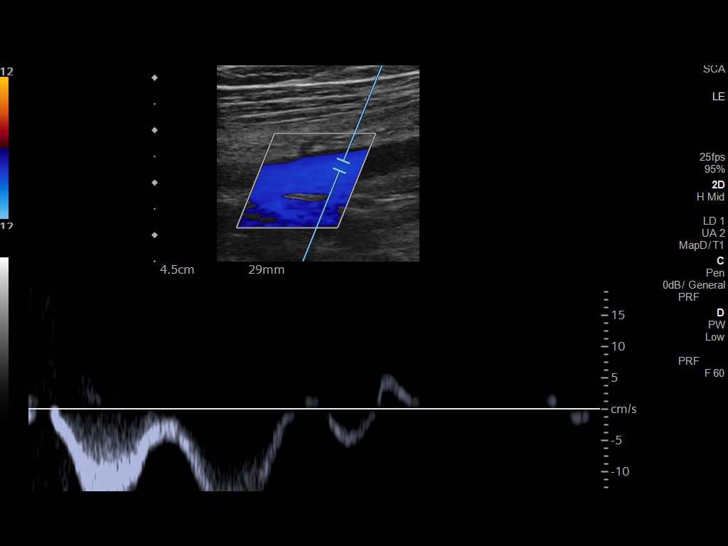
[im 20/38]
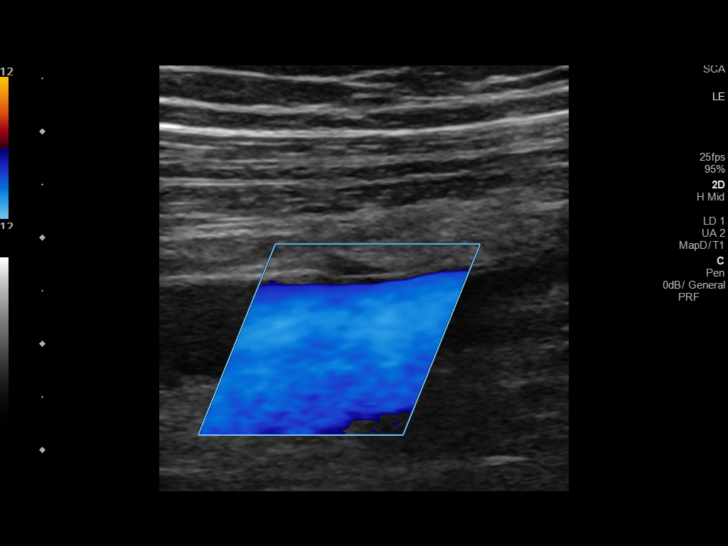
[im 23/38]
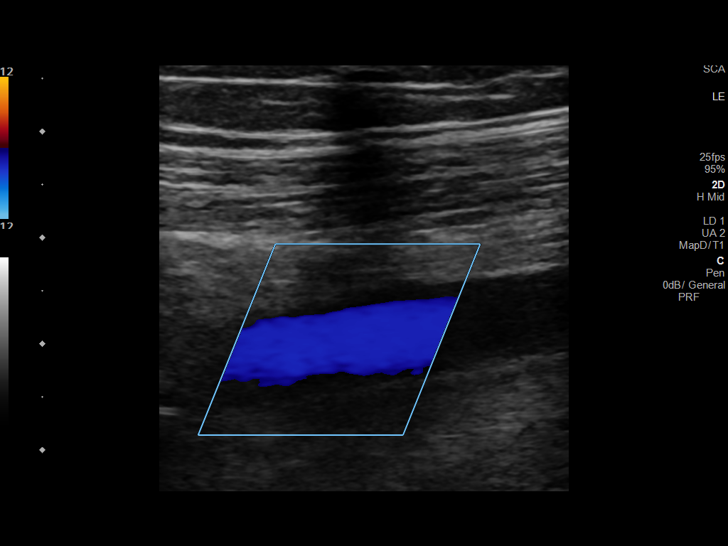
[im 26/38]
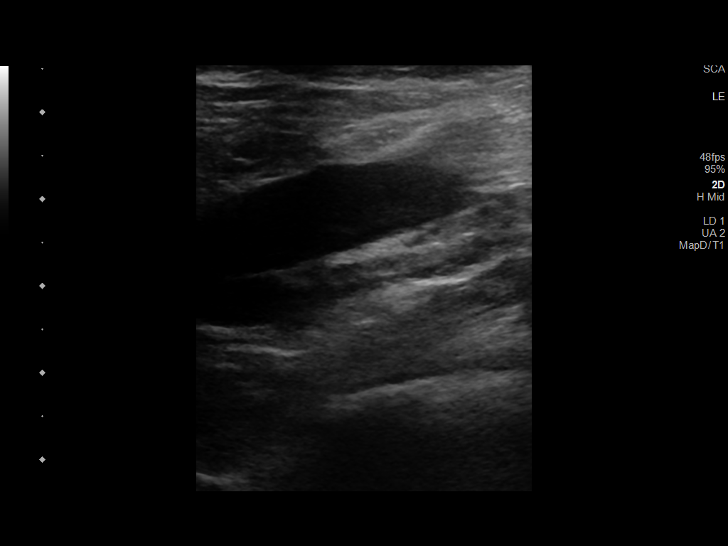
[im 29/38]
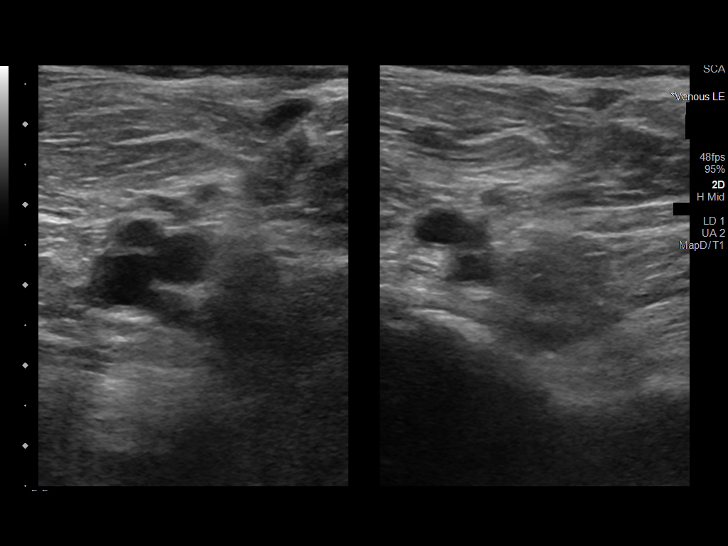
[im 31/38]
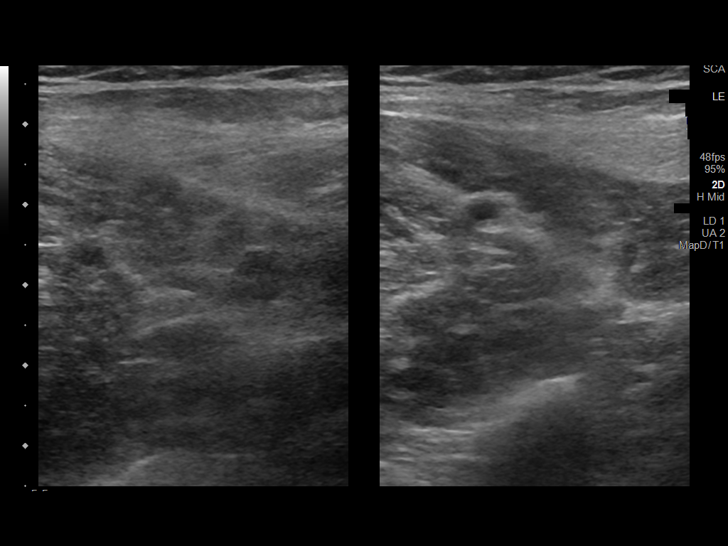
[im 34/38]
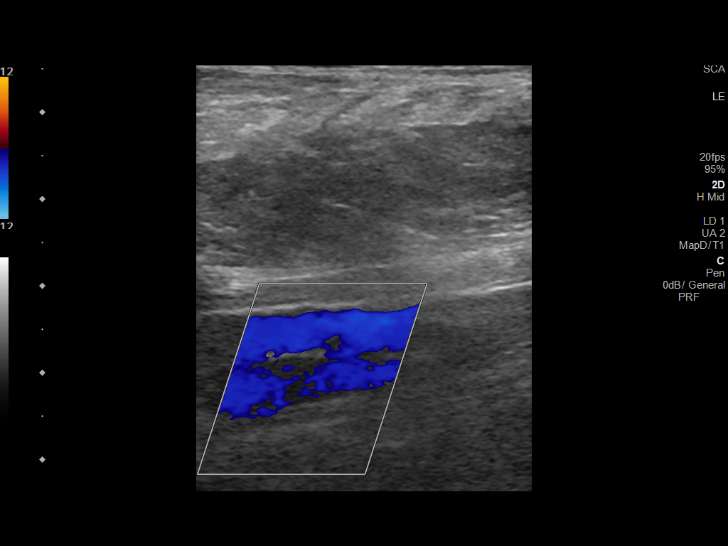
[im 38/38]
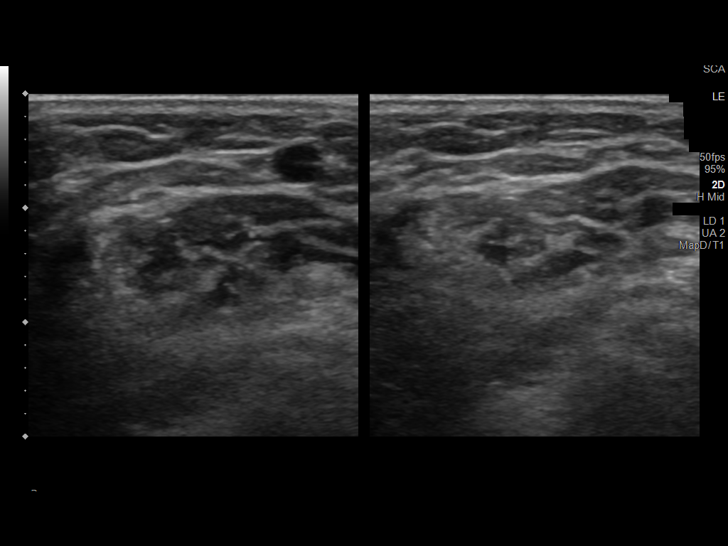

[14 of 24 positions shown; findings below may reference images not displayed]

FINDINGS: VENOUS

Normal compressibility of the common femoral, superficial femoral,
and popliteal veins, as well as the visualized calf veins.
Visualized portions of profunda femoral vein and great saphenous
vein unremarkable. No filling defects to suggest DVT on grayscale or
color Doppler imaging. Doppler waveforms show normal direction of
venous flow, normal respiratory plasticity and response to
augmentation.

Limited views of the contralateral common femoral vein are
unremarkable.

OTHER

None.

Limitations: none
IMPRESSION: Normal venous evaluation of the right lower extremity.  No DVT.

## 2022-05-23 IMAGING — CR DG KNEE COMPLETE 4+V*R*
4 series · 4 of 4 positions shown · non-contrast
Comparison: None

CLINICAL DATA: Anterolateral RIGHT knee pain with slight swelling,
question injury at the gym

EXAM:
RIGHT KNEE - COMPLETE 4+ VIEW

[t knee ap right]
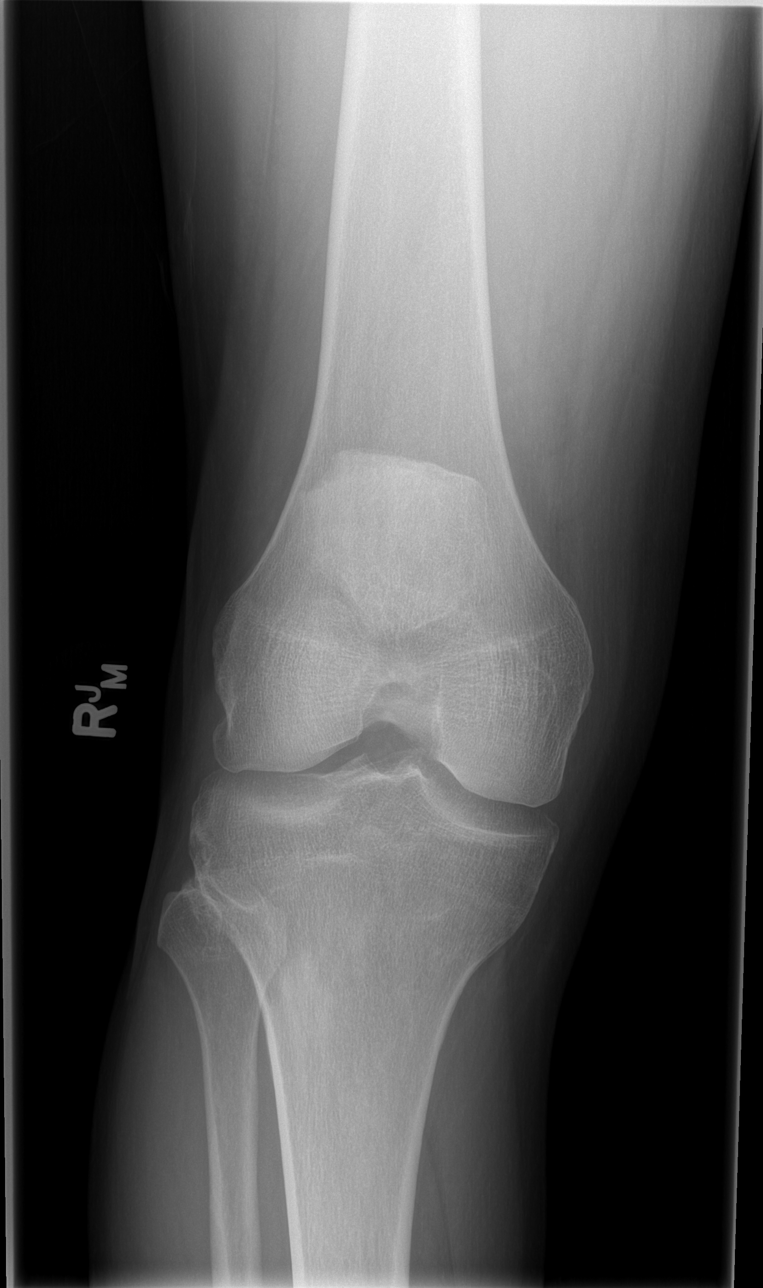

[t knee oblique right (1 of 2)]
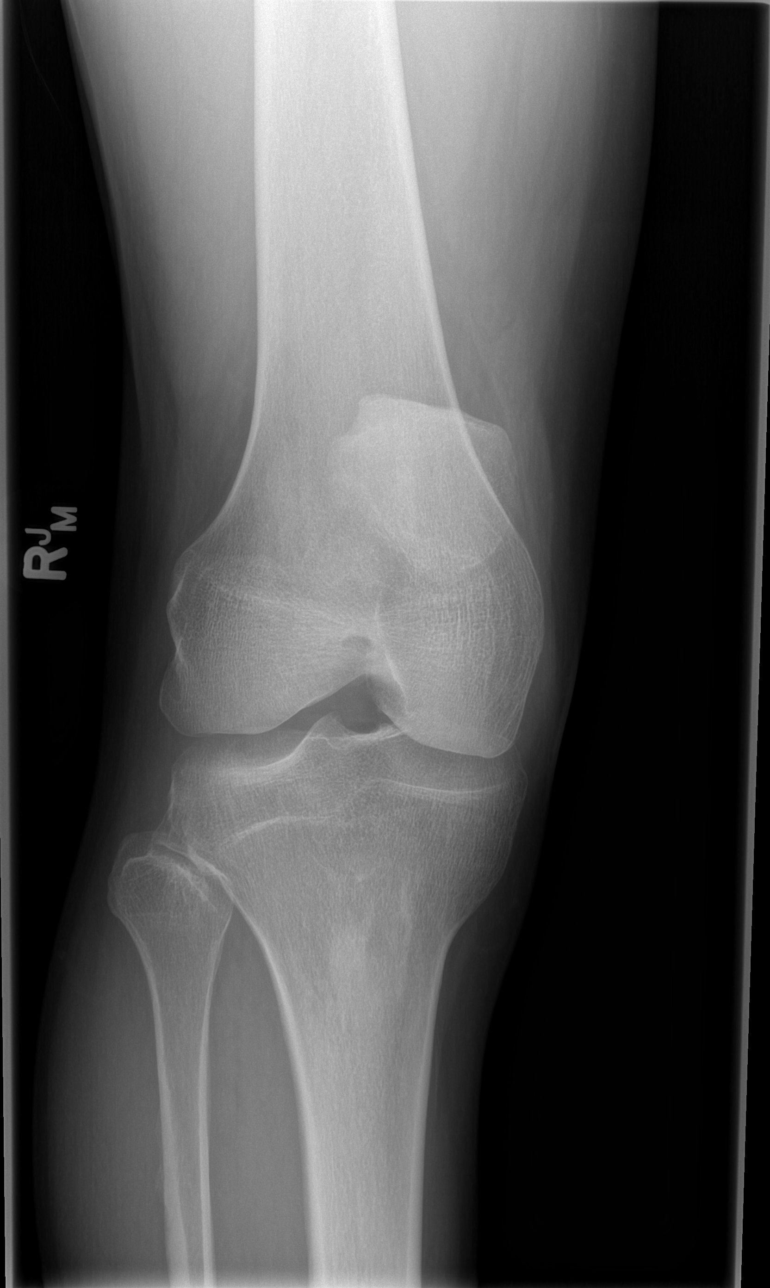

[t knee oblique right (2 of 2)]
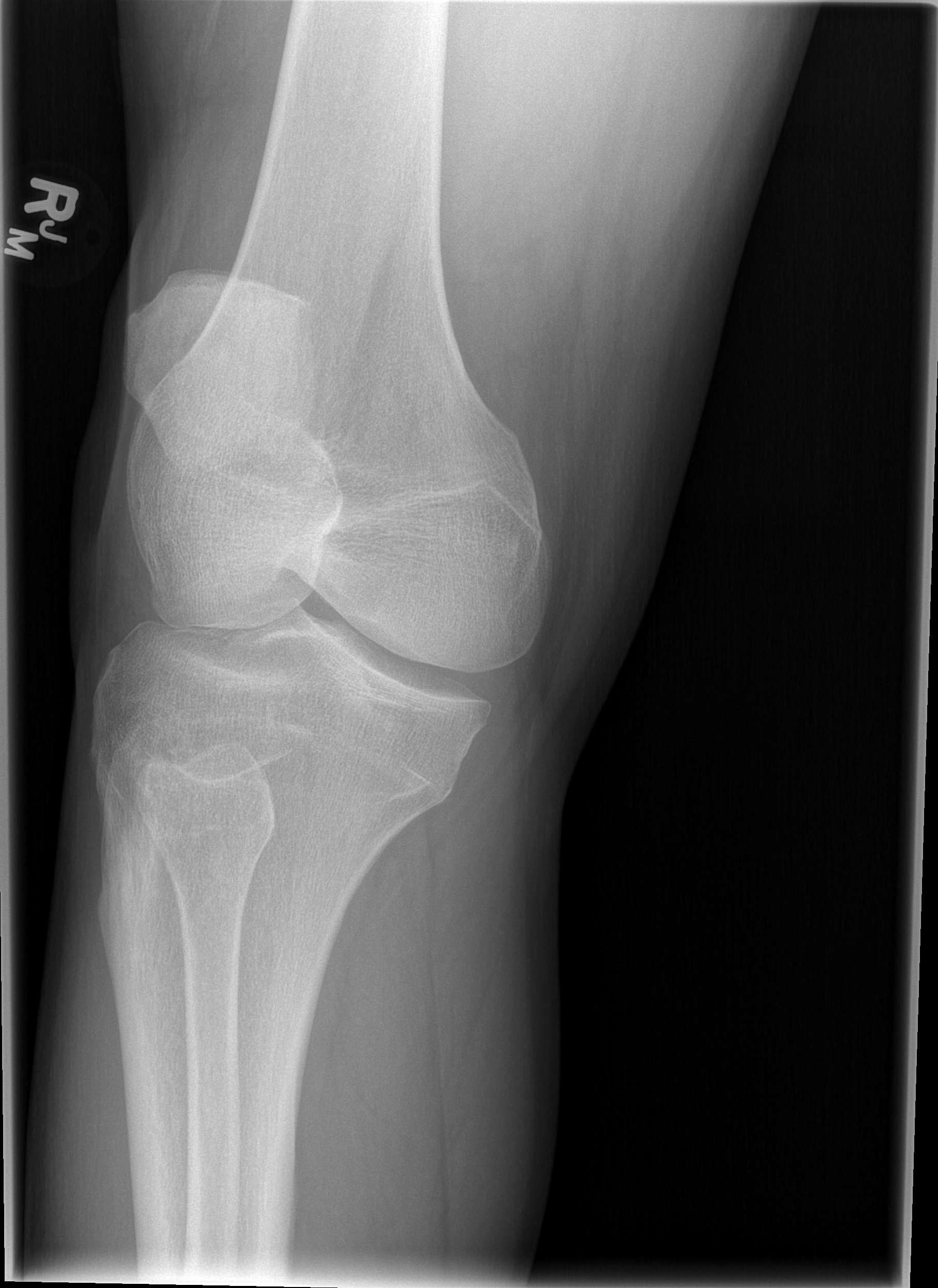

[t knee lat right]
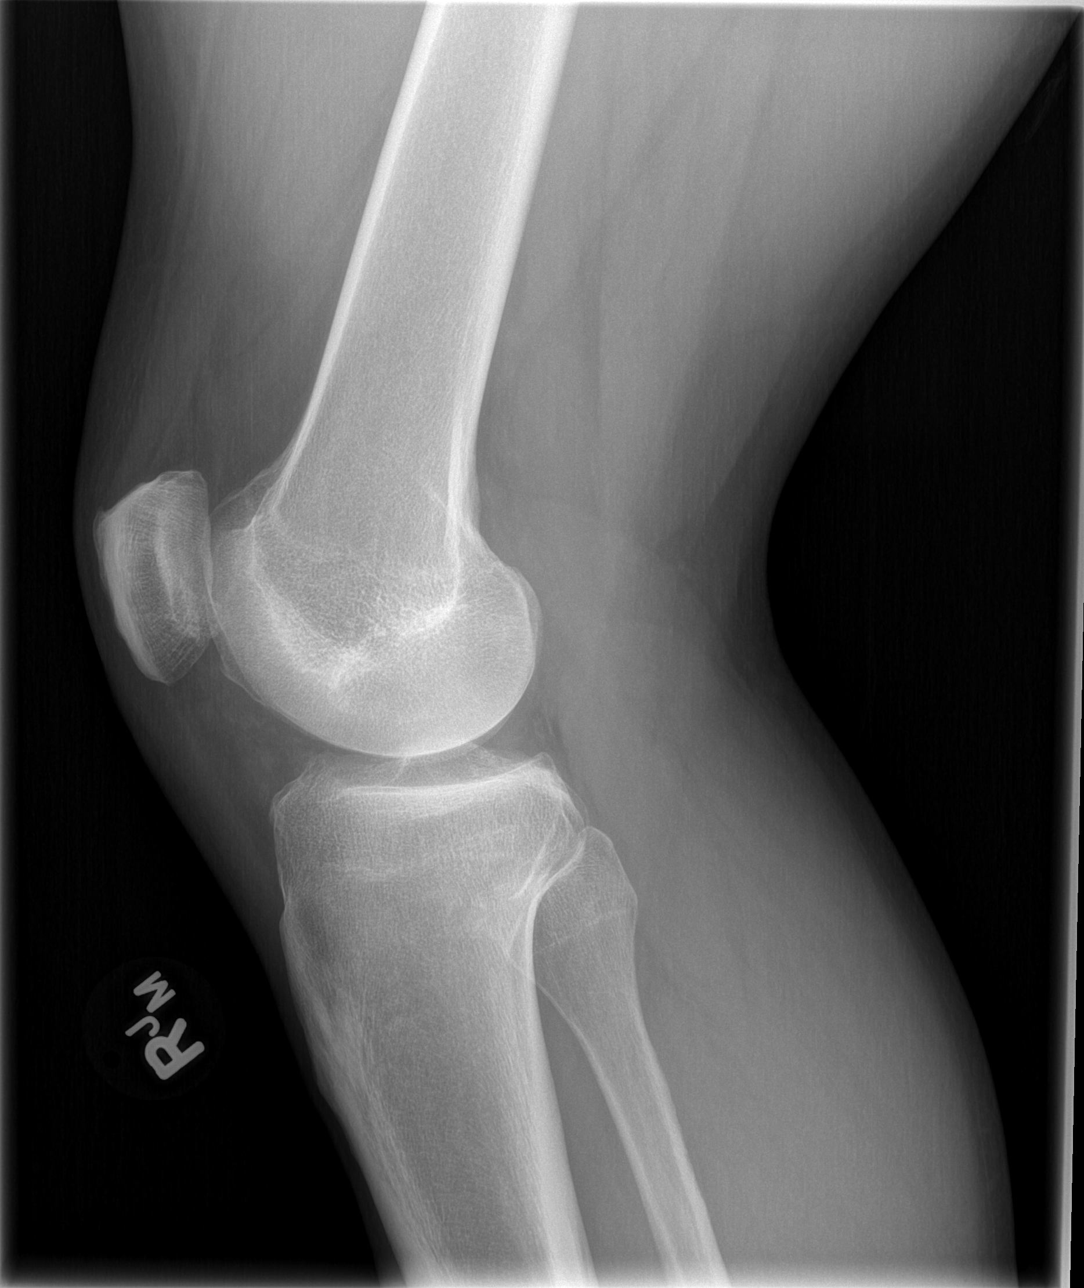

[4 of 4 positions shown; findings below may reference images not displayed]

FINDINGS: Osseous mineralization low normal.

Joint spaces preserved.

No acute fracture, dislocation, or bone destruction.

No joint effusion.
IMPRESSION: No acute osseous abnormalities.

## 2022-05-29 ENCOUNTER — Emergency Department (HOSPITAL_BASED_OUTPATIENT_CLINIC_OR_DEPARTMENT_OTHER): Payer: Managed Care, Other (non HMO)

## 2022-05-29 ENCOUNTER — Encounter (HOSPITAL_BASED_OUTPATIENT_CLINIC_OR_DEPARTMENT_OTHER): Payer: Self-pay | Admitting: Emergency Medicine

## 2022-05-29 ENCOUNTER — Emergency Department (HOSPITAL_BASED_OUTPATIENT_CLINIC_OR_DEPARTMENT_OTHER)
Admission: EM | Admit: 2022-05-29 | Discharge: 2022-05-29 | Disposition: A | Discharge status: Home / Self Care | Payer: Managed Care, Other (non HMO) | Attending: Emergency Medicine | Admitting: Emergency Medicine

## 2022-05-29 ENCOUNTER — Other Ambulatory Visit: Payer: Self-pay

## 2022-05-29 DIAGNOSIS — W208XXA Other cause of strike by thrown, projected or falling object, initial encounter: Secondary | ICD-10-CM | POA: Insufficient documentation

## 2022-05-29 DIAGNOSIS — M25561 Pain in right knee: Secondary | ICD-10-CM | POA: Diagnosis present

## 2022-05-29 MED ORDER — MELOXICAM 15 MG PO TABS: 15.0000 mg | ORAL_TABLET | Freq: Every day | ORAL | 0 refills | Status: AC

## 2022-05-29 NOTE — Discharge Instructions (Signed)
You were seen today for right-sided knee pain.  I have prescribed anti-inflammatories.  Please follow-up as discussed with your orthopedic provider

## 2022-05-29 NOTE — ED Provider Notes (Signed)
[facility] EMERGENCY DEPARTMENT Provider Note   CSN: [id number] Arrival date & time: 05/29/22  1606     History  Chief Complaint  Patient presents with   Knee Pain    Allen Sawyer is a 61 y.o. male.  Patient presents to the emergency department complaining of right-sided knee pain.  He states that he frequently works out and that 2 weeks ago a computer was dropped on his right knee.  He is unsure of his pain is related to the recent injury.  He states that he was evaluated for similar pain in the right knee approximately 6 months ago and was given anti-inflammatories at that time.  Patient states the majority of his pain is in the medial portion of the right knee.  He denies any recent trauma or injury other than the computer incident.  He denies any pop or feeling that the knee gives out while working out.  The knee pain seems to come on gradually.  Past medical history significant for low back pain, thoracic back pain, seasonal allergies, cervical radiculopathy, capsulitis of left shoulder  HPI     Home Medications Prior to Admission medications   Medication Sig Start Date End Date Taking? Authorizing Provider  meloxicam (MOBIC) 15 MG tablet Take 1 tablet (15 mg total) by mouth daily for 15 days. 05/29/22 06/13/22 Yes Darrick Grinder, PA-C  amLODipine (NORVASC) 5 MG tablet Take 1 tablet (5 mg total) by mouth daily. Patient not taking: Reported on 12/02/2021 10/29/21 01/27/22  Theadora Rama Scales, PA-C  azithromycin (ZITHROMAX Z-PAK) 250 MG tablet Take 2 tablets (500 mg) on  Day 1,  followed by 1 tablet (250 mg) once daily on Days 2 through 5. 04/26/22   Clayborne Dana, NP  fluticasone (FLONASE) 50 MCG/ACT nasal spray Place 2 sprays into both nostrils daily. Patient not taking: Reported on 04/26/2022 10/29/21   Theadora Rama Scales, PA-C  levocetirizine (XYZAL) 5 MG tablet Take 1 tablet (5 mg total) by mouth every evening. Patient not taking: Reported on 04/26/2022 11/22/21    Clayborne Dana, NP      Allergies    Patient has no known allergies.    Review of Systems   Review of Systems  Musculoskeletal:  Positive for arthralgias.    Physical Exam Updated Vital Signs BP (!) 159/65 (BP Location: Left Arm)   Pulse 72   Temp 98.6 F (37 C) (Oral)   Resp 18   Wt 106.6 kg   SpO2 97%   BMI 30.17 kg/m  Physical Exam Vitals and nursing note reviewed.  Constitutional:      General: He is not in acute distress. HENT:     Head: Normocephalic and atraumatic.     Mouth/Throat:     Mouth: Mucous membranes are moist.  Eyes:     Conjunctiva/sclera: Conjunctivae normal.  Cardiovascular:     Rate and Rhythm: Normal rate.  Pulmonary:     Effort: Pulmonary effort is normal.  Musculoskeletal:        General: Swelling (Minimal swelling noted to medial right knee) and tenderness (Mild tenderness to palpation medial right knee) present. Normal range of motion.     Cervical back: Normal range of motion.  Skin:    General: Skin is warm and dry.  Neurological:     Mental Status: He is alert and oriented to person, place, and time.     ED Results / Procedures / Treatments   Labs (all labs ordered are listed,  but only abnormal results are displayed) Labs Reviewed - No data to display  EKG None  Radiology DG Knee Complete 4 Views Right  Result Date: 05/29/2022 CLINICAL DATA:  Object fell off forklift and hit knee 2 weeks ago. Persistent right knee pain. EXAM: RIGHT KNEE - COMPLETE 4+ VIEW COMPARISON:  08/23/2021 FINDINGS: No evidence of fracture, dislocation, or joint effusion. No evidence of arthropathy or other focal bone abnormality. Soft tissues are unremarkable. IMPRESSION: Negative. Electronically Signed   By: Danae Orleans M.D.   On: 05/29/2022 17:23    Procedures Procedures    Medications Ordered in ED Medications - No data to display  ED Course/ Medical Decision Making/ A&P                           Medical Decision Making Amount and/or  Complexity of Data Reviewed Radiology: ordered.   Patient presents with a chief complaint of right-sided knee pain.  Differential includes fracture, dislocation, soft tissue injury, ligamentous injury, and others  I reviewed the patient's medical history and reviewed charts showing recent orthopedic visits for capsulitis of the left shoulder  I ordered and interpreted imaging including plain films of the right knee.  Negative for fracture or dislocation.  I agree with radiologist findings  This may be an overuse injury with some mild tendinitis.  I will discharge the patient home with anti-inflammatories.  He will follow-up with his orthopedic provider for further evaluation and management as needed.  Work note provided at patient's request.        Final Clinical Impression(s) / ED Diagnoses Final diagnoses:  Acute pain of right knee    Rx / DC Orders ED Discharge Orders          Ordered    meloxicam (MOBIC) 15 MG tablet  Daily        05/29/22 1844              Pamala Duffel 05/29/22 1844    Sloan Leiter, DO 05/31/22 [phone]

## 2022-05-29 NOTE — ED Triage Notes (Signed)
Pt arrives pov, steady gait, c/o right knee pain and swelling today. Reports work computer fell on knee x 2 weeks pta

## 2022-06-27 ENCOUNTER — Ambulatory Visit: Payer: Managed Care, Other (non HMO) | Admitting: Family Medicine

## 2022-06-28 ENCOUNTER — Emergency Department (HOSPITAL_BASED_OUTPATIENT_CLINIC_OR_DEPARTMENT_OTHER): Payer: Managed Care, Other (non HMO)

## 2022-06-28 ENCOUNTER — Emergency Department (HOSPITAL_BASED_OUTPATIENT_CLINIC_OR_DEPARTMENT_OTHER)
Admission: EM | Admit: 2022-06-28 | Discharge: 2022-06-28 | Disposition: A | Discharge status: Home / Self Care | Payer: Managed Care, Other (non HMO) | Attending: Emergency Medicine | Admitting: Emergency Medicine

## 2022-06-28 ENCOUNTER — Other Ambulatory Visit: Payer: Self-pay

## 2022-06-28 DIAGNOSIS — M25561 Pain in right knee: Secondary | ICD-10-CM | POA: Diagnosis present

## 2022-06-28 DIAGNOSIS — M7121 Synovial cyst of popliteal space [Baker], right knee: Secondary | ICD-10-CM | POA: Diagnosis not present

## 2022-06-28 DIAGNOSIS — J321 Chronic frontal sinusitis: Secondary | ICD-10-CM | POA: Diagnosis not present

## 2022-06-28 DIAGNOSIS — Z20822 Contact with and (suspected) exposure to covid-19: Secondary | ICD-10-CM | POA: Insufficient documentation

## 2022-06-28 LAB — SARS CORONAVIRUS 2 BY RT PCR: SARS Coronavirus 2 by RT PCR: NEGATIVE

## 2022-06-28 MED ORDER — AMOXICILLIN-POT CLAVULANATE 875-125 MG PO TABS: 1.0000 | ORAL_TABLET | Freq: Two times a day (BID) | ORAL | 0 refills | Status: DC

## 2022-06-28 NOTE — ED Provider Notes (Signed)
[facility] EMERGENCY DEPARTMENT Provider Note   CSN: [id number] Arrival date & time: 06/28/22  1440     History  Chief Complaint  Patient presents with   Knee Pain   Fever    Allen Sawyer is a 61 y.o. male with history of sleep apnea and recurrent sinusitis who presents to the emergency department complaining of R knee pain, nasal congestion, and intermittent chills. Knee pain for several weeks. Nasal congestion and chills for 2 months. Hx of similar, symptoms improved with antibiotics from his PCP and allergy medication. Ran out of allergy meds. No documented fever. No known injury to the knee. Former [employer] development [employer], exercises frequently.    Knee Pain Fever Associated symptoms: chills and congestion        Home Medications Prior to Admission medications   Medication Sig Start Date End Date Taking? Authorizing Provider  amoxicillin-clavulanate (AUGMENTIN) 875-125 MG tablet Take 1 tablet by mouth every 12 (twelve) hours. 06/28/22  Yes Merrit Friesen T, PA-C  amLODipine (NORVASC) 5 MG tablet Take 1 tablet (5 mg total) by mouth daily. Patient not taking: Reported on 12/02/2021 10/29/21 01/27/22  Theadora Rama Scales, PA-C  azithromycin (ZITHROMAX Z-PAK) 250 MG tablet Take 2 tablets (500 mg) on  Day 1,  followed by 1 tablet (250 mg) once daily on Days 2 through 5. 04/26/22   Clayborne Dana, NP  fluticasone (FLONASE) 50 MCG/ACT nasal spray Place 2 sprays into both nostrils daily. Patient not taking: Reported on 04/26/2022 10/29/21   Theadora Rama Scales, PA-C  levocetirizine (XYZAL) 5 MG tablet Take 1 tablet (5 mg total) by mouth every evening. Patient not taking: Reported on 04/26/2022 11/22/21   Clayborne Dana, NP      Allergies    Patient has no known allergies.    Review of Systems   Review of Systems  Constitutional:  Positive for chills.  HENT:  Positive for congestion, sinus pressure and sinus pain.   Musculoskeletal:  Positive for arthralgias.  All other systems  reviewed and are negative.   Physical Exam Updated Vital Signs BP (!) 169/102 Comment: RN notified  Pulse 77   Temp 98.2 F (36.8 C)   Resp 18   Ht 6\' 2"  (1.88 m)   Wt 106.6 kg   SpO2 97%   BMI 30.17 kg/m  Physical Exam Vitals and nursing note reviewed.  Constitutional:      Appearance: Normal appearance.  HENT:     Head: Normocephalic and atraumatic.  Eyes:     Conjunctiva/sclera: Conjunctivae normal.  Pulmonary:     Effort: Pulmonary effort is normal. No respiratory distress.  Musculoskeletal:     Right knee: No effusion or bony tenderness. Normal range of motion. Normal meniscus and normal patellar mobility.     Instability Tests: Medial McMurray test negative and lateral McMurray test negative.     Left knee: Normal.     Comments: Swelling and tenderness to medial R knee without overlying skin changes  Skin:    General: Skin is warm and dry.  Neurological:     Mental Status: He is alert.  Psychiatric:        Mood and Affect: Mood normal.        Behavior: Behavior normal.     ED Results / Procedures / Treatments   Labs (all labs ordered are listed, but only abnormal results are displayed) Labs Reviewed  SARS CORONAVIRUS 2 BY RT PCR    EKG None  Radiology Venous Img Lower  Right (DVT Study)  Result Date: 06/28/2022 CLINICAL DATA:  right leg/knee pain EXAM: Right LOWER EXTREMITY VENOUS DOPPLER ULTRASOUND TECHNIQUE: Gray-scale sonography with compression, as well as color and duplex ultrasound, were performed to evaluate the deep venous system(s) from the level of the common femoral vein through the popliteal and proximal calf veins. COMPARISON:  None Available. FINDINGS: VENOUS Normal compressibility of the common femoral, superficial femoral, and popliteal veins, as well as the visualized calf veins. Visualized portions of profunda femoral vein and great saphenous vein unremarkable. No filling defects to suggest DVT on grayscale or color Doppler imaging.  Doppler waveforms show normal direction of venous flow, normal respiratory plasticity and response to augmentation. Limited views of the contralateral common femoral vein are unremarkable. OTHER There is an elongated cystic structure at the right popliteal fossa measuring 6.5 cm craniocaudad by 1.4 cm AP Limitations: none IMPRESSION: No right lower extremity DVT seen. Right popliteal cyst measuring 6.5 x 1.4 cm. Electronically Signed   By: Marjo Bicker M.D.   On: 06/28/2022 17:23    Procedures Procedures    Medications Ordered in ED Medications - No data to display  ED Course/ Medical Decision Making/ A&P                           Medical Decision Making Risk Prescription drug management.  Patient is a 61 year old male with history of sleep apnea and recurrent sinusitis who presents the emergency department complaining of nasal congestion and intermittent chills for several months, and right knee pain for several weeks.  On exam patient is hypertensive, admits he has not been using his CPAP recently. Some swelling and tenderness to medial R knee. No laxity or effusions. Nasal congestion with facial pain.  Ultrasound of R leg shows popliteal cyst, no DVT.  Patient has follow-up appointment with orthopedics that his knee pain tomorrow.  I recommended he discuss the ultrasound findings with the doctor.  Will prescribe antibiotics for recurrent sinusitis and recommend continuing flonase and adding daily allergy medication.  Recommended follow-up with PCP.  Stable for discharge to home.        Final Clinical Impression(s) / ED Diagnoses Final diagnoses:  Popliteal cyst, right  Chronic frontal sinusitis    Rx / DC Orders ED Discharge Orders          Ordered    amoxicillin-clavulanate (AUGMENTIN) 875-125 MG tablet  Every 12 hours        06/28/22 1748           Portions of this report may have been transcribed using voice recognition software. Every effort was made to ensure  accuracy; however, inadvertent computerized transcription errors may be present.    Su Monks, PA-C 06/28/22 1858    Sloan Leiter, DO 06/29/22 [phone]

## 2022-06-28 NOTE — ED Notes (Signed)
US at bedside

## 2022-06-28 NOTE — Discharge Instructions (Signed)
You were seen in the emergency department today for knee pain and chills.  As we discussed your ultrasound showed that you have a cyst behind your right knee.  No evidence of blood clots.  I have also sent an antibiotic for your chronic sinusitis.  I recommend following up with the orthopedist tomorrow, and your primary doctor about long-term management of your sinus symptoms.  I also recommend taking allergy medicine daily.

## 2022-06-29 ENCOUNTER — Ambulatory Visit (INDEPENDENT_AMBULATORY_CARE_PROVIDER_SITE_OTHER): Payer: Managed Care, Other (non HMO) | Admitting: Family Medicine

## 2022-06-29 ENCOUNTER — Encounter: Payer: Self-pay | Admitting: Family Medicine

## 2022-06-29 VITALS — BP 132/86 | Ht 74.0 in | Wt 235.0 lb

## 2022-06-29 DIAGNOSIS — M7121 Synovial cyst of popliteal space [Baker], right knee: Secondary | ICD-10-CM

## 2022-06-29 NOTE — Progress Notes (Signed)
  Allen Sawyer - 61 y.o. male MRN [id number]  Date of birth: 1961-05-27  SUBJECTIVE:  Including CC & ROS.  No chief complaint on file.   Allen Sawyer is a 61 y.o. male that is presenting with right knee Baker's cyst.  This was observed yesterday in the emergency department.  Has some soreness in the knee but no injury.  Review of the emergency department note from 7/30 shows he was provided meloxicam. Review of the emergency department note from 8/29 shows he was provided Augmentin. Independent review of the right knee x-ray from 7/30 shows no acute changes. Independent review of the right lower leg duplex from 8/29 shows a Baker's cyst.  Review of Systems See HPI   HISTORY: Past Medical, Surgical, Social, and Family History Reviewed & Updated per EMR.   Pertinent Historical Findings include:  Past Medical History:  Diagnosis Date   Sleep apnea     Past Surgical History:  Procedure Laterality Date   APPENDECTOMY       PHYSICAL EXAM:  VS: BP 132/86 (BP Location: Left Arm, Patient Position: Sitting)   Ht 6\' 2"  (1.88 m)   Wt 235 lb (106.6 kg)   BMI 30.17 kg/m  Physical Exam Gen: NAD, alert, cooperative with exam, well-appearing MSK:  Neurovascularly intact       ASSESSMENT & PLAN:   Synovial cyst of right popliteal space Acutely occurring.  It was found incidentally on the lower extremity duplex scan yesterday.  No significant pain on exam today. -Counseled on home exercise therapy and supportive care. -Counseled on compression. -Counseled on meloxicam. -Consider aspiration

## 2022-06-29 NOTE — Patient Instructions (Signed)
Good to see you  Please use compression  Please try the exercises  Please use the mobic as needed  Please send me a message in MyChart with any questions or updates.  Please see me back in 4 weeks or as needed if better.   --Dr. Jordan Likes

## 2022-06-29 NOTE — Assessment & Plan Note (Signed)
Acutely occurring.  It was found incidentally on the lower extremity duplex scan yesterday.  No significant pain on exam today. -Counseled on home exercise therapy and supportive care. -Counseled on compression. -Counseled on meloxicam. -Consider aspiration

## 2022-07-27 ENCOUNTER — Encounter: Payer: Self-pay | Admitting: Family Medicine

## 2022-07-27 ENCOUNTER — Ambulatory Visit (INDEPENDENT_AMBULATORY_CARE_PROVIDER_SITE_OTHER): Payer: Managed Care, Other (non HMO) | Admitting: Family Medicine

## 2022-07-27 VITALS — BP 146/90 | Ht 74.0 in | Wt 235.0 lb

## 2022-07-27 DIAGNOSIS — M23203 Derangement of unspecified medial meniscus due to old tear or injury, right knee: Secondary | ICD-10-CM | POA: Diagnosis not present

## 2022-07-27 NOTE — Progress Notes (Signed)
  Allen Sawyer - 61 y.o. male MRN [id number]  Date of birth: [birth year]  SUBJECTIVE:  Including CC & ROS.  No chief complaint on file.   Allen Sawyer is a 61 y.o. male that is presenting with acute medial right knee pain.  The pain is localized to this area.  He notices the pain intermittently.   Review of Systems See HPI   HISTORY: Past Medical, Surgical, Social, and Family History Reviewed & Updated per EMR.   Pertinent Historical Findings include:  Past Medical History:  Diagnosis Date   Sleep apnea     Past Surgical History:  Procedure Laterality Date   APPENDECTOMY       PHYSICAL EXAM:  VS: BP (!) 146/90 (BP Location: Right Arm, Patient Position: Sitting)   Ht 6\' 2"  (1.88 m)   Wt 235 lb (106.6 kg)   BMI 30.17 kg/m  Physical Exam Gen: NAD, alert, cooperative with exam, well-appearing MSK:  Neurovascularly intact       ASSESSMENT & PLAN:   Degenerative tear of medial meniscus of right knee Acutely occurring.  Symptoms more consistent with degenerative meniscal changes.  Does have a history of a Baker's cyst. -Counseled on home exercise therapy and supportive care. -Provided work note. -Could consider injection or physical therapy or further imaging

## 2022-07-27 NOTE — Patient Instructions (Signed)
Good to see you Please use ice as needed  You can try voltaren over the counter  Please consider the compression   Please send me a message in MyChart with any questions or updates.  Please see me back in 6 weeks or as needed if better.   --Dr. Raeford Razor

## 2022-07-27 NOTE — Assessment & Plan Note (Signed)
Acutely occurring.  Symptoms more consistent with degenerative meniscal changes.  Does have a history of a Baker's cyst. -Counseled on home exercise therapy and supportive care. -Provided work note. -Could consider injection or physical therapy or further imaging

## 2022-10-06 ENCOUNTER — Ambulatory Visit (INDEPENDENT_AMBULATORY_CARE_PROVIDER_SITE_OTHER): Payer: Managed Care, Other (non HMO) | Admitting: Family Medicine

## 2022-10-06 ENCOUNTER — Encounter: Payer: Self-pay | Admitting: Family Medicine

## 2022-10-06 VITALS — BP 138/90 | Ht 74.0 in | Wt 230.0 lb

## 2022-10-06 DIAGNOSIS — M546 Pain in thoracic spine: Secondary | ICD-10-CM

## 2022-10-06 DIAGNOSIS — M23203 Derangement of unspecified medial meniscus due to old tear or injury, right knee: Secondary | ICD-10-CM

## 2022-10-06 MED ORDER — MELOXICAM 15 MG PO TABS: 15.0000 mg | ORAL_TABLET | Freq: Every day | ORAL | 1 refills | Status: DC
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
## 2022-10-06 NOTE — Patient Instructions (Signed)
Good to see you Please use ice You can consider compression  Please try the exercises  Please send me a message in MyChart with any questions or updates.  Please see me back in 4-6 weeks.   --Dr. Jordan Likes
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
## 2022-10-06 NOTE — Assessment & Plan Note (Signed)
Acute on chronic in nature.  Does have changes within the meniscus as well as an effusion and Baker's cyst appreciated on recent MRI. -Counseled on home exercise therapy and supportive care. -Meloxicam. -Could consider injection or physical therapy.

## 2022-11-02 ENCOUNTER — Ambulatory Visit: Payer: Managed Care, Other (non HMO) | Admitting: Family Medicine

## 2022-11-17 ENCOUNTER — Ambulatory Visit (INDEPENDENT_AMBULATORY_CARE_PROVIDER_SITE_OTHER): Payer: Managed Care, Other (non HMO) | Admitting: Family Medicine

## 2022-11-17 ENCOUNTER — Encounter: Payer: Self-pay | Admitting: Family Medicine

## 2022-11-17 VITALS — BP 152/100 | Ht 75.0 in | Wt 230.0 lb

## 2022-11-17 DIAGNOSIS — M23203 Derangement of unspecified medial meniscus due to old tear or injury, right knee: Secondary | ICD-10-CM

## 2022-11-17 NOTE — Progress Notes (Signed)
  Allen Sawyer - 62 y.o. male MRN [id number]  Date of birth: 17-Jul-1961  SUBJECTIVE:  Including CC & ROS.  No chief complaint on file.   Allen Sawyer is a 62 y.o. male that is  following up for his knee pain. Doing better with better range of motion and limited pain.    Review of Systems See HPI   HISTORY: Past Medical, Surgical, Social, and Family History Reviewed & Updated per EMR.   Pertinent Historical Findings include:  Past Medical History:  Diagnosis Date   Sleep apnea     Past Surgical History:  Procedure Laterality Date   APPENDECTOMY       PHYSICAL EXAM:  VS: BP (!) 152/100   Ht 6\' 3"  (1.905 m)   Wt 230 lb (104.3 kg)   BMI 28.75 kg/m  Physical Exam Gen: NAD, alert, cooperative with exam, well-appearing MSK:  Neurovascularly intact       ASSESSMENT & PLAN:   Degenerative tear of medial meniscus of right knee Doing well with the modalities in place. No effusion today and good range of motion  - counseled on home exercise therapy and supportive care - provided work note  - referral to PT  - could consider injection.

## 2022-11-17 NOTE — Assessment & Plan Note (Signed)
Doing well with the modalities in place. No effusion today and good range of motion  - counseled on home exercise therapy and supportive care - provided work note  - referral to PT  - could consider injection.

## 2022-11-17 NOTE — Patient Instructions (Signed)
Good to see you Please use ice as needed  We have sent a referral to physical therapy   Please send me a message in MyChart with any questions or updates.  Please see me back as needed.   --Dr. Raeford Razor

## 2022-12-12 ENCOUNTER — Encounter: Payer: Self-pay | Admitting: Family Medicine

## 2022-12-12 ENCOUNTER — Ambulatory Visit (INDEPENDENT_AMBULATORY_CARE_PROVIDER_SITE_OTHER): Payer: Managed Care, Other (non HMO) | Admitting: Family Medicine

## 2022-12-12 VITALS — BP 140/88 | HR 74 | Temp 98.5°F | Ht 74.0 in | Wt 233.0 lb

## 2022-12-12 DIAGNOSIS — R030 Elevated blood-pressure reading, without diagnosis of hypertension: Secondary | ICD-10-CM

## 2022-12-12 DIAGNOSIS — J0100 Acute maxillary sinusitis, unspecified: Secondary | ICD-10-CM | POA: Diagnosis not present

## 2022-12-12 MED ORDER — PREDNISONE 20 MG PO TABS: 40.0000 mg | ORAL_TABLET | Freq: Every day | ORAL | 0 refills | Status: AC

## 2022-12-12 MED ORDER — DOXYCYCLINE HYCLATE 100 MG PO TABS: 100.0000 mg | ORAL_TABLET | Freq: Two times a day (BID) | ORAL | 0 refills | Status: AC

## 2022-12-12 NOTE — Patient Instructions (Addendum)
Continue to push fluids, practice good hand hygiene, and cover your mouth if you cough.  If you start having fevers, shaking or shortness of breath, seek immediate care.  OK to take Tylenol 1000 mg (2 extra strength tabs) or 975 mg (3 regular strength tabs) every 6 hours as needed.  Around 3 times per week, check your blood pressure 4 times per day. Twice in the morning and twice in the evening. The readings should be at least one minute apart. Write down these values and bring them to your next nurse visit/appointment.  When you check your BP, make sure you have been doing something calm/relaxing 5 minutes prior to checking. Both feet should be flat on the floor and you should be sitting. Use your left arm and make sure it is in a relaxed position (on a table), and that the cuff is at the approximate level/height of your heart.  I want your blood pressure less than 140 on the top and less than 90 on the bottom consistently. Both goals must be met (ie, 150/70 is too high even though the 70 on the bottom is desirable).   If not at goal in the next month, I want you to come to our clinic to discuss our next steps.   Let us know if you need anything.

## 2022-12-12 NOTE — Progress Notes (Signed)
Chief Complaint  Patient presents with   Sinusitis    Brunetta Jeans here for URI complaints.  Duration: 2 weeks  Associated symptoms: sinus headache, sinus congestion, sinus pain, rhinorrhea, itchy watery eyes, and coughing Denies: ear pain, ear drainage, sore throat, wheezing, shortness of breath, myalgia, and fevers Treatment to date: ibuprofen Sick contacts: No  Past Medical History:  Diagnosis Date   Sleep apnea     Objective BP (!) 140/88 (BP Location: Left Arm, Cuff Size: Large)   Pulse 74   Temp 98.5 F (36.9 C) (Oral)   Ht 6' 2"$  (1.88 m)   Wt 233 lb (105.7 kg)   SpO2 97%   BMI 29.92 kg/m  General: Awake, alert, appears stated age HEENT: AT, [place], ears patent b/l and TM's neg, nares patent w/o discharge, pharynx pink and without exudates, MMM, +ttp over max sinuses b/l Neck: No masses or asymmetry Heart: RRR Lungs: CTAB, no accessory muscle use Psych: Age appropriate judgment and insight, normal mood and affect  Acute maxillary sinusitis, recurrence not specified - Plan: predniSONE (DELTASONE) 20 MG tablet, doxycycline (VIBRA-TABS) 100 MG tablet  Elevated blood pressure reading  Given duration, will give doxy for 7 d. 5 d pred burst also. Hold NSAIDs while on this. Continue to push fluids, practice good hand hygiene, cover mouth when coughing. Monitor BP at home over next mo and if >140/90, will rtc to discuss BP med options.  F/u prn. If starting to experience fevers, shaking, or shortness of breath, seek immediate care. Pt voiced understanding and agreement to the plan.  Bryant, DO 12/12/22 9:26 AM

## 2022-12-19 ENCOUNTER — Other Ambulatory Visit: Payer: Self-pay

## 2022-12-19 ENCOUNTER — Encounter (HOSPITAL_BASED_OUTPATIENT_CLINIC_OR_DEPARTMENT_OTHER): Payer: Self-pay | Admitting: Emergency Medicine

## 2022-12-19 ENCOUNTER — Emergency Department (HOSPITAL_BASED_OUTPATIENT_CLINIC_OR_DEPARTMENT_OTHER)
Admission: EM | Admit: 2022-12-19 | Discharge: 2022-12-19 | Disposition: A | Discharge status: Home / Self Care | Payer: Managed Care, Other (non HMO) | Attending: Emergency Medicine | Admitting: Emergency Medicine

## 2022-12-19 DIAGNOSIS — T50905A Adverse effect of unspecified drugs, medicaments and biological substances, initial encounter: Secondary | ICD-10-CM | POA: Diagnosis not present

## 2022-12-19 DIAGNOSIS — R0602 Shortness of breath: Secondary | ICD-10-CM | POA: Insufficient documentation

## 2022-12-19 NOTE — ED Triage Notes (Signed)
Pt states he is taking medication for a sinus infection and tonight he took a pill then started feeling an itchy feeling in his epigastric area  Pt states he almost feels like he has a chest cold and has felt short of breath  Pt feels like his abdomen is distended and feels like he has gas

## 2022-12-19 NOTE — Discharge Instructions (Signed)
You have been seen today for your complaint of medication reaction. Follow up with: Your primary care provider in 1 week for reevaluation Please seek immediate medical care if you develop any of the following symptoms: Your shortness of breath gets worse. You have shortness of breath when you are resting. You feel light-headed or you faint. You have a cough that is not controlled with medicines. You cough up blood. You have pain with breathing. You have pain in your chest, arms, shoulders, or abdomen. You have a fever. At this time there does not appear to be the presence of an emergent medical condition, however there is always the potential for conditions to change. Please read and follow the below instructions.  Do not take your medicine if  develop an itchy rash, swelling in your mouth or lips, or difficulty breathing; call 911 and seek immediate emergency medical attention if this occurs.  You may review your lab tests and imaging results in their entirety on your MyChart account.  Please discuss all results of fully with your primary care provider and other specialist at your follow-up visit.  Note: Portions of this text may have been transcribed using voice recognition software. Every effort was made to ensure accuracy; however, inadvertent computerized transcription errors may still be present.

## 2022-12-19 NOTE — ED Provider Notes (Signed)
[facility] Provider Note   CSN: [id number] Arrival date & time: 12/19/22  1911     History  Chief Complaint  Patient presents with   Shortness of Breath    Allen Sawyer is a 62 y.o. male.  With history of sleep apnea who presents to the ED for evaluation of left chest congestion.  He states that he has been dealing with a sinus infection for the past month.  He was started on doxycycline for this by his primary care provider and reported significant improvement.  This morning when he took one of his pills he began to feel a tight sensation in his throat along with some chest congestion and slight shortness of breath with exertion.  The symptoms lasted approximately 1 hour and resolved prior to arrival.  He states he feels completely normal at the time of my examination.  He states his apartment is warm and very dry and he has a sore throat very often.  He denies shortness of breath at this time, chest pain, throat pain.  He states he has had intermittent chills for the past month but denies rigors, fevers, night sweats, cough.   Shortness of Breath      Home Medications Prior to Admission medications   Medication Sig Start Date End Date Taking? Authorizing Provider  doxycycline (VIBRA-TABS) 100 MG tablet Take 1 tablet (100 mg total) by mouth 2 (two) times daily for 7 days. 12/12/22 12/19/22  Shelda Pal, DO  fluticasone (FLONASE) 50 MCG/ACT nasal spray Place 2 sprays into both nostrils daily. 10/29/21   Lynden Oxford Scales, PA-C  levocetirizine (XYZAL) 5 MG tablet Take 1 tablet (5 mg total) by mouth every evening. 11/22/21   Terrilyn Saver, NP  meloxicam (MOBIC) 15 MG tablet Take 1 tablet (15 mg total) by mouth daily. 10/06/22   Rosemarie Ax, MD      Allergies    Patient has no known allergies.    Review of Systems   Review of Systems  Respiratory:  Positive for shortness of breath.   All other systems reviewed and  are negative.   Physical Exam Updated Vital Signs BP (!) 153/98 (BP Location: Left Arm)   Pulse 78   Temp 98.1 F (36.7 C) (Oral)   Resp 18   Ht 6' 1"$  (1.854 m)   Wt 105.7 kg   SpO2 98%   BMI 30.74 kg/m  Physical Exam Vitals and nursing note reviewed.  Constitutional:      General: He is not in acute distress.    Appearance: Normal appearance. He is normal weight. He is not ill-appearing.  HENT:     Head: Normocephalic and atraumatic.     Mouth/Throat:     Mouth: Mucous membranes are moist.     Pharynx: Oropharynx is clear. Uvula midline. No oropharyngeal exudate or posterior oropharyngeal erythema.     Tonsils: No tonsillar exudate or tonsillar abscesses. 0 on the right. 0 on the left.  Cardiovascular:     Rate and Rhythm: Normal rate and regular rhythm.  Pulmonary:     Effort: Pulmonary effort is normal. No respiratory distress.     Breath sounds: No decreased breath sounds, wheezing, rhonchi or rales.  Abdominal:     General: Abdomen is flat.  Musculoskeletal:        General: Normal range of motion.     Cervical back: Normal range of motion and neck supple.     Right  lower leg: No edema.     Left lower leg: No edema.  Lymphadenopathy:     Cervical: No cervical adenopathy.  Skin:    General: Skin is warm and dry.     Capillary Refill: Capillary refill takes less than 2 seconds.  Neurological:     General: No focal deficit present.     Mental Status: He is alert and oriented to person, place, and time.  Psychiatric:        Mood and Affect: Mood normal.        Behavior: Behavior normal.     ED Results / Procedures / Treatments   Labs (all labs ordered are listed, but only abnormal results are displayed) Labs Reviewed - No data to display  EKG None  Radiology No results found.  Procedures Procedures    Medications Ordered in ED Medications - No data to display  ED Course/ Medical Decision Making/ A&P                             Medical Decision  Making This patient presents to the ED for concern of chest congestion, this involves an extensive number of treatment options, and is a complaint that carries with it a high risk of complications and morbidity.  The differential diagnosis includes URI, pneumonia, foreign body  Additional history obtained from: Nursing notes from this visit.  Afebrile, hemodynamically stable.  62 year old male presenting to the ED for evaluation of shortness of breath after ingesting doxycycline.  His symptoms were transient and resolved prior to arrival.  He states he "just wanted to make sure everything was all right."  On exam, patient appears overall very well.  There are no adventitious breath sounds.  Posterior oropharynx is unremarkable.  Low suspicion for angioedema or anaphylaxis. he was encouraged to follow-up with his primary care provider regarding his persistent intermittent chills.  He was given return precautions.  Stable at discharge.  At this time there does not appear to be any evidence of an acute emergency medical condition and the patient appears stable for discharge with appropriate outpatient follow up. Diagnosis was discussed with patient who verbalizes understanding of care plan and is agreeable to discharge. I have discussed return precautions with patient who verbalizes understanding. Patient encouraged to follow-up with their PCP within 1 week. All questions answered.  Note: Portions of this report may have been transcribed using voice recognition software. Every effort was made to ensure accuracy; however, inadvertent computerized transcription errors may still be present.        Final Clinical Impression(s) / ED Diagnoses Final diagnoses:  Adverse effect of drug, initial encounter    Rx / DC Orders ED Discharge Orders     None         Nehemiah Massed 12/19/22 2139    Regan Lemming, MD 12/20/22 0021

## 2022-12-23 ENCOUNTER — Encounter: Payer: Self-pay | Admitting: Family Medicine

## 2022-12-23 ENCOUNTER — Ambulatory Visit: Payer: Managed Care, Other (non HMO) | Admitting: Family Medicine

## 2022-12-23 VITALS — BP 134/80 | HR 78 | Temp 98.9°F | Ht 74.0 in | Wt 225.2 lb

## 2022-12-23 DIAGNOSIS — J069 Acute upper respiratory infection, unspecified: Secondary | ICD-10-CM

## 2022-12-23 NOTE — Progress Notes (Signed)
Chief Complaint  Patient presents with   Allergies    Ear pain Throat pain paperwork    Brunetta Jeans here for URI complaints.  Duration: 2 days  Associated symptoms: sore throat and cough, fatigue Denies: sinus congestion, sinus pain, rhinorrhea, itchy watery eyes, ear pain, ear drainage, wheezing, shortness of breath, myalgia, and fevers Treatment to date: doxy/prednisone helped w sinuses and then this happened Sick contacts: No  Past Medical History:  Diagnosis Date   Sleep apnea     Objective BP 134/80 (BP Location: Left Arm, Cuff Size: Large)   Pulse 78   Temp 98.9 F (37.2 C) (Oral)   Ht '6\' 2"'$  (1.88 m)   Wt 225 lb 4 oz (102.2 kg)   SpO2 96%   BMI 28.92 kg/m  General: Awake, alert, appears stated age HEENT: AT, [place], ears patent b/l and TM's neg, nares patent w/o discharge, pharynx pink and without exudates, MMM Neck: No masses or asymmetry Heart: RRR Lungs: CTAB, no accessory muscle use Psych: Age appropriate judgment and insight, normal mood and affect  Viral URI with cough  Continue to push fluids, practice good hand hygiene, cover mouth when coughing. Tylenol prn. Needs to rest. Send message if no better after weekend and will ck CXR.  F/u prn. If starting to experience fevers, shaking, or shortness of breath, seek immediate care. Pt voiced understanding and agreement to the plan.  Gil[place], DO 12/23/22 2:40 PM

## 2022-12-23 NOTE — Patient Instructions (Signed)
Keep the diet clean and stay active.  Strong work with your weight loss.  Continue to push fluids, practice good hand hygiene, and cover your mouth if you cough.  If you start having fevers, shaking or shortness of breath, seek immediate care.  Send me a message Monday if no better despite a weekend full of rest.   Let us know if you need anything.

## 2022-12-27 ENCOUNTER — Ambulatory Visit (HOSPITAL_BASED_OUTPATIENT_CLINIC_OR_DEPARTMENT_OTHER)
Admission: RE | Admit: 2022-12-27 | Discharge: 2022-12-27 | Disposition: A | Discharge status: Home / Self Care | Payer: Managed Care, Other (non HMO) | Source: Ambulatory Visit | Attending: Family | Admitting: Family

## 2022-12-27 ENCOUNTER — Encounter: Payer: Self-pay | Admitting: Family

## 2022-12-27 ENCOUNTER — Ambulatory Visit: Payer: Managed Care, Other (non HMO) | Admitting: Family

## 2022-12-27 VITALS — BP 146/84 | HR 90 | Resp 18 | Ht 74.0 in | Wt 227.2 lb

## 2022-12-27 DIAGNOSIS — R053 Chronic cough: Secondary | ICD-10-CM | POA: Insufficient documentation

## 2022-12-27 DIAGNOSIS — R030 Elevated blood-pressure reading, without diagnosis of hypertension: Secondary | ICD-10-CM

## 2022-12-27 MED ORDER — AZITHROMYCIN 250 MG PO TABS: ORAL_TABLET | ORAL | 0 refills | Status: DC

## 2022-12-27 MED ORDER — METHYLPREDNISOLONE 4 MG PO TBPK: ORAL_TABLET | ORAL | 0 refills | Status: DC

## 2022-12-27 MED ORDER — ALBUTEROL SULFATE HFA 108 (90 BASE) MCG/ACT IN AERS: 2.0000 | INHALATION_SPRAY | Freq: Four times a day (QID) | RESPIRATORY_TRACT | 2 refills | Status: DC | PRN

## 2022-12-27 NOTE — Progress Notes (Signed)
Allen Sawyer is a 62 y.o. male with the following history as recorded in [employer]:  Patient Active Problem List   Diagnosis Date Noted   Degenerative tear of medial meniscus of right knee 07/27/2022   Synovial cyst of right popliteal space 06/29/2022   Capsulitis of left shoulder 04/13/2022   Cervical radiculopathy 07/29/2021   Seasonal allergies 03/08/2021   Acute midline thoracic back pain 08/19/2020   Low back pain 08/22/2011    Current Outpatient Medications  Medication Sig Dispense Refill   albuterol (VENTOLIN HFA) 108 (90 Base) MCG/ACT inhaler Inhale 2 puffs into the lungs every 6 (six) hours as needed for wheezing or shortness of breath. 8 g 2   azithromycin (ZITHROMAX Z-PAK) 250 MG tablet Take 2 tablets (500 mg) PO today, then 1 tablet (250 mg) PO daily x4 days. 6 tablet 0   fluticasone (FLONASE) 50 MCG/ACT nasal spray Place 2 sprays into both nostrils daily. 18 mL 2   loratadine-pseudoephedrine (CLARITIN-D 24-HOUR) 10-240 MG 24 hr tablet Take 1 tablet by mouth daily.     methylPREDNISolone (MEDROL DOSEPAK) 4 MG TBPK tablet Taper as directed 21 tablet 0   meloxicam (MOBIC) 15 MG tablet Take 1 tablet (15 mg total) by mouth daily. (Patient not taking: Reported on 12/27/2022) 60 tablet 1   No current facility-administered medications for this visit.    Allergies: Patient has no known allergies.  Past Medical History:  Diagnosis Date   Sleep apnea     Past Surgical History:  Procedure Laterality Date   APPENDECTOMY      Family History  Problem Relation Age of Onset   Hyperlipidemia Mother    Hypertension Mother    Hyperlipidemia Father    Hypertension Father    Heart attack Father    Hyperlipidemia Sister    Hypertension Sister    Hypertension Brother    Hyperlipidemia Brother    Diabetes Brother    Heart attack Paternal Uncle    Sudden death Neg Hx     Social History   Tobacco Use   Smoking status: Never   Smokeless tobacco: Never  Substance Use Topics    Alcohol use: Yes    Comment: occ    Subjective:   Patient was seen last Friday with similar symptoms; felt to be viral in nature at that time; had been treated with course of antibiotics in mid-February- was given Doxycyline and Prednisone at that time and felt like those symptoms resolved; has been using his albuterol with some relief; cough more noticeable at night;  Has been taking Claritin D daily;      Objective:  Vitals:   12/27/22 1304 12/27/22 1327  BP: (!) 148/84 (!) 146/84  Pulse: 90   Resp: 18   SpO2: 95%   Weight: 227 lb 3.2 oz (103.1 kg)   Height: '6\' 2"'$  (1.88 m)     General: Well developed, well nourished, in no acute distress  Skin : Warm and dry.  Head: Normocephalic and atraumatic  Eyes: Sclera and conjunctiva clear; pupils round and reactive to light; extraocular movements intact  Ears: External normal; canals clear; tympanic membranes normal  Oropharynx: Pink, supple. No suspicious lesions  Neck: Supple without thyromegaly, adenopathy  Lungs: Respirations unlabored; coarse breath sounds noted in lower lobes; CVS exam: normal rate and regular rhythm.  Neurologic: Alert and oriented; speech intact; face symmetrical; moves all extremities well; CNII-XII intact without focal deficit   Assessment:  1. Persistent cough for 3 weeks or longer  2. Elevated blood pressure reading     Plan:  Concern for possible pneumonia; will update CXR today; Rx for Z-pak and Medrol Dose Pak; refill on albuterol; stressed need to get back on his Flonase in addition to Claritin D to help control his allergies; follow up to be determined based on CXR results;  ? If blood pressure elevated due to illness; he needs to start monitoring and should plan to see his PCP in follow up;   No follow-ups on file.  Orders Placed This Encounter  Procedures   DG Chest 2 View    Standing Status:   Future    Number of Occurrences:   1    Standing Expiration Date:   12/28/2023    Order Specific  Question:   Reason for Exam (SYMPTOM  OR DIAGNOSIS REQUIRED)    Answer:   persisting cough    Order Specific Question:   Preferred imaging location?    Answer:   [facility]    Requested Prescriptions   Signed Prescriptions Disp Refills   azithromycin (ZITHROMAX Z-PAK) 250 MG tablet 6 tablet 0    Sig: Take 2 tablets (500 mg) PO today, then 1 tablet (250 mg) PO daily x4 days.   methylPREDNISolone (MEDROL DOSEPAK) 4 MG TBPK tablet 21 tablet 0    Sig: Taper as directed   albuterol (VENTOLIN HFA) 108 (90 Base) MCG/ACT inhaler 8 g 2    Sig: Inhale 2 puffs into the lungs every 6 (six) hours as needed for wheezing or shortness of breath.

## 2022-12-30 ENCOUNTER — Telehealth: Payer: Self-pay | Admitting: *Deleted

## 2022-12-30 NOTE — Telephone Encounter (Signed)
Pt came into office and needed help with medication directions (medrol).  Advised on how to take medication.  He wanted to know if he should go back to work on 01/01/23 and would like to get another note.  I advised him he stated that he was feeling better yesterday when he spoke with assistant.  I advised him that per Mickel Baas he will need to be seen again.  Appointment scheduled with Lovena Le for Monday.

## 2023-01-02 ENCOUNTER — Encounter: Payer: Self-pay | Admitting: Family Medicine

## 2023-01-02 ENCOUNTER — Ambulatory Visit: Payer: Managed Care, Other (non HMO) | Admitting: Family Medicine

## 2023-01-02 VITALS — BP 139/84 | HR 80 | Resp 19 | Ht 74.0 in | Wt 224.6 lb

## 2023-01-02 DIAGNOSIS — J309 Allergic rhinitis, unspecified: Secondary | ICD-10-CM

## 2023-01-02 MED ORDER — MONTELUKAST SODIUM 10 MG PO TABS: 10.0000 mg | ORAL_TABLET | Freq: Every day | ORAL | 3 refills | Status: DC

## 2023-01-02 MED ORDER — BENZONATATE 200 MG PO CAPS: 200.0000 mg | ORAL_CAPSULE | Freq: Two times a day (BID) | ORAL | 0 refills | Status: DC | PRN

## 2023-01-02 NOTE — Patient Instructions (Signed)
Finish the rest of your steroids. Continue with as needed albuterol, mildly wheezy today. Get over the counter allergy medicine - Xyzal and continue Flonase.  Adding Singulair and cough pills. Referral to Allergist for allergy testing to see potential triggers of your symptoms.  Continue supportive measures including rest, hydration, humidifier use, steam showers, warm compresses to sinuses, warm liquids with lemon and honey, and over-the-counter cough, cold, and analgesics as needed.

## 2023-01-02 NOTE — Progress Notes (Signed)
Acute Office Visit  Subjective:     Patient ID: Allen Sawyer, male    DOB: 03/31/1961, 62 y.o.   MRN: [id number]  Chief Complaint  Patient presents with   Follow-up    Feels much better  Still has chills, weakness and cough     HPI Patient is in today for URI, cough follow-up.  Patient was seen on 12/27/22 for cough (he had been treated for viral infection a few days prior). He was started on oral steroids and Zpak, and he was encouraged to restart his Flonase and Claritin D. CXR was clear. BP was elevated and he was encouraged to follow-up with PCP. Reports he has been working hard on lifestyle measures for BP management and feels like it was high last time due to illness. Patient denies any chest pain, palpitations, dyspnea, edema, recurrent headaches, vision changes.   Today he reports that he is feeling about 75% better, but still with occasional cough and wheezing. 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 Reports he just finished the antibiotic, but has another day or so of steroids. He has started Flonase, but not an antihistamine. He reports this happens to him about once or twice a year and he is interested in seeing an allergist.       ROS All review of systems negative except what is listed in the HPI      Objective:    BP 139/84 (BP Location: Left Arm, Patient Position: Sitting, Cuff Size: Large)   Pulse 80   Resp 19   Ht '6\' 2"'$  (1.88 m)   Wt 224 lb 9.6 oz (101.9 kg)   SpO2 99%   BMI 28.84 kg/m    Physical Exam Vitals reviewed.  Constitutional:      Appearance: Normal appearance.  HENT:     Head: Normocephalic and atraumatic.  Cardiovascular:     Rate and Rhythm: Normal rate and regular rhythm.  Pulmonary:     Effort: Pulmonary effort is normal.     Breath sounds: Wheezing present.  Skin:    General: Skin is warm and dry.  Neurological:     General: No focal deficit present.     Mental Status:  He is alert and oriented to person, place, and time.  Psychiatric:        Mood and Affect: Mood normal.        Behavior: Behavior normal.        Thought Content: Thought content normal.        Judgment: Judgment normal.     No results found for any visits on 01/02/23.      Assessment & Plan:   Problem List Items Addressed This Visit   None Visit Diagnoses     Allergic rhinitis, unspecified seasonality, unspecified trigger    -  Primary Finish the rest of your steroids. Continue with as needed albuterol, mildly wheezy today. Get over the counter allergy medicine - Xyzal and continue Flonase.  Adding Singulair and cough pills. Referral to Allergist for allergy testing to see potential triggers of your symptoms.  Continue supportive measures including rest, hydration, humidifier use, steam showers, warm compresses to sinuses, warm liquids with lemon and honey, and over-the-counter cough, cold, and analgesics as needed.     Relevant Medications   montelukast (SINGULAIR) 10 MG tablet   benzonatate (TESSALON) 200 MG capsule   Other Relevant Orders   Ambulatory referral to Allergy  Meds ordered this encounter  Medications   montelukast (SINGULAIR) 10 MG tablet    Sig: Take 1 tablet (10 mg total) by mouth at bedtime.    Dispense:  30 tablet    Refill:  3    Order Specific Question:   Supervising Provider    Answer:   Penni Homans A [[id number]]   benzonatate (TESSALON) 200 MG capsule    Sig: Take 1 capsule (200 mg total) by mouth 2 (two) times daily as needed for cough.    Dispense:  20 capsule    Refill:  0    Order Specific Question:   Supervising Provider    Answer:   Penni Homans A [id number]    Return if symptoms worsen or fail to improve, for ; schedule physical with fasting labs .  Terrilyn Saver, NP

## 2023-01-11 ENCOUNTER — Encounter: Payer: Self-pay | Admitting: Internal Medicine

## 2023-01-11 ENCOUNTER — Ambulatory Visit: Payer: Managed Care, Other (non HMO) | Admitting: Internal Medicine

## 2023-01-11 VITALS — BP 138/88 | HR 76 | Temp 97.9°F | Resp 18 | Ht 73.5 in | Wt 222.9 lb

## 2023-01-11 DIAGNOSIS — J3089 Other allergic rhinitis: Secondary | ICD-10-CM

## 2023-01-11 DIAGNOSIS — Z720 Tobacco use: Secondary | ICD-10-CM

## 2023-01-11 DIAGNOSIS — J4530 Mild persistent asthma, uncomplicated: Secondary | ICD-10-CM

## 2023-01-11 DIAGNOSIS — J309 Allergic rhinitis, unspecified: Secondary | ICD-10-CM

## 2023-01-11 DIAGNOSIS — H1013 Acute atopic conjunctivitis, bilateral: Secondary | ICD-10-CM

## 2023-01-11 DIAGNOSIS — H1010 Acute atopic conjunctivitis, unspecified eye: Secondary | ICD-10-CM

## 2023-01-11 DIAGNOSIS — J302 Other seasonal allergic rhinitis: Secondary | ICD-10-CM

## 2023-01-11 MED ORDER — LEVOCETIRIZINE DIHYDROCHLORIDE 5 MG PO TABS: 5.0000 mg | ORAL_TABLET | Freq: Every evening | ORAL | 5 refills | Status: DC

## 2023-01-11 MED ORDER — BUDESONIDE-FORMOTEROL FUMARATE 80-4.5 MCG/ACT IN AERO: 2.0000 | INHALATION_SPRAY | Freq: Two times a day (BID) | RESPIRATORY_TRACT | 5 refills | Status: DC

## 2023-01-11 MED ORDER — MONTELUKAST SODIUM 10 MG PO TABS
10.0000 mg | ORAL_TABLET | Freq: Every day | ORAL | 3 refills | Status: DC
Start: 2023-01-11 — End: 2023-04-27

## 2023-01-11 MED ORDER — FLUTICASONE PROPIONATE 50 MCG/ACT NA SUSP
2.0000 | Freq: Every day | NASAL | 2 refills | Status: DC
Start: 2023-01-11 — End: 2023-04-27

## 2023-01-11 NOTE — Progress Notes (Signed)
New Patient Note  RE: Vail Armor MRN: [id number] DOB: 01-10-1961 Date of Office Visit: 01/11/2023  Consult requested by: Terrilyn Saver, NP Primary care provider: Terrilyn Saver, NP  Chief Complaint: Allergies and Allergic Rhinitis  (For the past 3 yrs been having chest congestion, runny/stuffy nose, coughing and wheezing Frequently.)  History of Present Illness: I had the pleasure of seeing Phyllip Itzkowitz for initial evaluation at the [facility] on 01/11/2023. He is a 62 y.o. male, who is referred here by Terrilyn Saver, NP for the evaluation of asthma and allergies .  History obtained from patient, chart review.  Asthma History:  -Diagnosed at age never formally diagnosed .  -Current symptoms include cough, shortness of breath, and wheezing Daily when ill daytime symptoms in past month, 1-2 times per week when ill nighttime awakenings in past month Using rescue inhaler: last use 2 weeks ago  -Limitations to daily activity: none - 1 ED visits, 0 UC visits and 1 oral steroids in the past year - 0 number of lifetime hospitalizations, 0 number of lifetime intubations.  - Identified Triggers: respiratory illness - Up-to-date with Covid-19, vaccines. - History of prior pneumonias: unknown  - History of prior COVID-19 infection: denies  - Smoking exposure: previous smoker, still smokes socially  Previous Diagnostics:  - Prior PFTs or spirometry: None prior - Most Recent AEC (11/22/22): 100 -Most Recent Chest Imaging: Chest x-ray on (2 /27 /24): Trachea is midline. Heart size stable. Lungs are clear. No pleural fluid. - Today's Asthma Control Test:  .21/25  Management:  - Previously used therapies: albuterol .  - Current regimen:  - Maintenance: none  - Rescue: Albuterol 2 puffs q4-6 hrs PRN, not using  prior to exercise   Chronic rhinitis: started as a young adult  Symptoms include: nasal congestion, rhinorrhea, post nasal drainage, sneezing, and itchy eyes   Occurs seasonally-spring  Potential triggers: pollen season  Treatments tried: Xyzal, Flonase, Singulair, Claritin- D Previous allergy testing: no History of reflux/heartburn: no History of chronic sinusitis or sinus surgery: no Nonallergic triggers:  denies      Assessment and Plan: Helmer is a 62 y.o. male with: Mild persistent asthma without complication - Plan: Spirometry with Graph  Seasonal and perennial allergic rhinitis  Seasonal allergic conjunctivitis  Tobacco use  Allergic rhinitis, unspecified seasonality, unspecified trigger   Plan: Patient Instructions  Mild Persistent  Asthma: not well  Controlled  - your lung testing today showed in full patient in your lungs that was reversible.  This is diagnostic of asthma  PLAN:  - Spacer sample and demonstration provided. - Controller Inhaler: Start Symbicort 2 puffs twice a day; This Should Be Used Everyday - Rinse mouth out after use - Rescue Inhaler: Albuterol (Proair/Ventolin) 2 puffs . Use  every 4-6 hours as needed for chest tightness, wheezing, or coughing.  Can also use 15 minutes prior to exercise if you have symptoms with activity. - Asthma is not controlled if:  - Symptoms are occurring >2 times a week OR  - >2 times a month nighttime awakenings  - You are requiring systemic steroids (prednisone/steroid injections) more than once per year  - Your require hospitalization for your asthma.  - Please call the clinic to schedule a follow up if these symptoms arise  Chronic Rhinitis  likely allergic  : - allergy testing today: deferred due to recent antihistamines   - Symptom control: - Continue Nasal Steroid Spray: Best results if  used daily. - Options include Flonase (fluticasone), Nasocort (triamcinolone), Nasonex (mometasome) 1- 2 sprays in each nostril daily.  - All can be purchased over-the-counter if not covered by insurance. - Continue Singulair (Montelukast) '10mg'$  nightly.   - Discontinue if nightmares  of behavior changes. - Continue Antihistamine: daily or daily as needed.   -Options include Zyrtec (Cetirizine) '10mg'$ , Claritin (Loratadine) '10mg'$ , Allegra (Fexofenadine) '180mg'$ , or Xyzal (Levocetirinze) '5mg'$  - Can be purchased over-the-counter if not covered by insurance.  Allergic Conjunctivitis:  - Consider Allergy Eye drops-great options include Pataday (Olopatadine) or Zaditor (ketotifen) for eye symptoms daily as needed-both sold over the counter if not covered by insurance. and Rewetting Drops such as Systane,TheraTears, etc  -Avoid eye drops that say red eye relief as they may contain medications that dry out your eyes.   Follow up: for skin testing - Will need to hold antihistamines (claritin, zyrtec, allegra) for 3 days prior, can continue all other medications   Thank you so much for letting me partake in your care today.  Don't hesitate to reach out if you have any additional concerns!  Roney Marion, MD  [facility] [place] [place]   Meds ordered this encounter  Medications   levocetirizine (XYZAL) 5 MG tablet    Sig: Take 1 tablet (5 mg total) by mouth every evening.    Dispense:  32 tablet    Refill:  5   budesonide-formoterol (SYMBICORT) 80-4.5 MCG/ACT inhaler    Sig: Inhale 2 puffs into the lungs in the morning and at bedtime.    Dispense:  1 each    Refill:  5   fluticasone (FLONASE) 50 MCG/ACT nasal spray    Sig: Place 2 sprays into both nostrils daily.    Dispense:  18 mL    Refill:  2   montelukast (SINGULAIR) 10 MG tablet    Sig: Take 1 tablet (10 mg total) by mouth at bedtime.    Dispense:  30 tablet    Refill:  3   Lab Orders  No laboratory test(s) ordered today    Other allergy screening: Asthma: yes Rhino conjunctivitis: yes Food allergy: no Medication allergy: no Hymenoptera allergy: no Urticaria: no Eczema:yes: mild behind his eyes and eyebrows.  Does not bother him  History of recurrent infections suggestive of  immunodeficency: no  Diagnostics: Spirometry:  Tracings reviewed. His effort: Good reproducible efforts. FVC: 3.27 L FEV1: 2.16 L, 63% predicted FEV1/FVC ratio: 66% Interpretation: Spirometry consistent with mixed obstructive and restrictive disease.  After 4 puffs of albuterol FEV1 increased by 22% and 470 cc, FVC increased by 16% and 510 cc.  This is a significant postbronchodilator response Please see scanned spirometry results for details.  Skin Testing: Deferred due to recent antihistamines use.  Results interpreted by myself and discussed with patient/family.   Past Medical History: Patient Active Problem List   Diagnosis Date Noted   Mild persistent asthma without complication [id number]   Seasonal allergic conjunctivitis 01/11/2023   Seasonal and perennial allergic rhinitis 01/11/2023   Tobacco use 01/11/2023   Degenerative tear of medial meniscus of right knee 07/27/2022   Synovial cyst of right popliteal space 06/29/2022   Capsulitis of left shoulder 04/13/2022   Cervical radiculopathy 07/29/2021   Seasonal allergies 03/08/2021   Acute midline thoracic back pain 08/19/2020   Low back pain 08/22/2011   Past Medical History:  Diagnosis Date   Sleep apnea    Past Surgical History: Past Surgical History:  Procedure Laterality Date  APPENDECTOMY     Medication List:  Current Outpatient Medications  Medication Sig Dispense Refill   albuterol (VENTOLIN HFA) 108 (90 Base) MCG/ACT inhaler Inhale 2 puffs into the lungs every 6 (six) hours as needed for wheezing or shortness of breath. 8 g 2   budesonide-formoterol (SYMBICORT) 80-4.5 MCG/ACT inhaler Inhale 2 puffs into the lungs in the morning and at bedtime. 1 each 5   levocetirizine (XYZAL) 5 MG tablet Take 1 tablet (5 mg total) by mouth every evening. 32 tablet 5   loratadine-pseudoephedrine (CLARITIN-D 24-HOUR) 10-240 MG 24 hr tablet Take 1 tablet by mouth daily.     azithromycin (ZITHROMAX Z-PAK) 250 MG tablet Take  2 tablets (500 mg) PO today, then 1 tablet (250 mg) PO daily x4 days. (Patient not taking: Reported on 01/11/2023) 6 tablet 0   benzonatate (TESSALON) 200 MG capsule Take 1 capsule (200 mg total) by mouth 2 (two) times daily as needed for cough. (Patient not taking: Reported on 01/11/2023) 20 capsule 0   fluticasone (FLONASE) 50 MCG/ACT nasal spray Place 2 sprays into both nostrils daily. 18 mL 2   meloxicam (MOBIC) 15 MG tablet Take 1 tablet (15 mg total) by mouth daily. (Patient not taking: Reported on 01/11/2023) 60 tablet 1   methylPREDNISolone (MEDROL DOSEPAK) 4 MG TBPK tablet Taper as directed (Patient not taking: Reported on 01/11/2023) 21 tablet 0   montelukast (SINGULAIR) 10 MG tablet Take 1 tablet (10 mg total) by mouth at bedtime. 30 tablet 3   No current facility-administered medications for this visit.   Allergies: No Known Allergies Social History: Social History   Socioeconomic History   Marital status: Single    Spouse name: Not on file   Number of children: Not on file   Years of education: Not on file   Highest education level: Not on file  Occupational History   Not on file  Tobacco Use   Smoking status: Never    Passive exposure: Never   Smokeless tobacco: Never  Vaping Use   Vaping Use: Never used  Substance and Sexual Activity   Alcohol use: Yes    Alcohol/week: 1.0 - 2.0 standard drink of alcohol    Types: 1 - 2 Shots of liquor per week    Comment: occ   Drug use: No   Sexual activity: Not Currently  Other Topics Concern   Not on file  Social History Narrative   Not on file   Social Determinants of Health   Financial Resource Strain: Not on file  Food Insecurity: Not on file  Transportation Needs: Not on file  Physical Activity: Not on file  Stress: Not on file  Social Connections: Not on file   Lives in a apartment that is 60 years old.  There are no roaches in the house advised to get off the floor.  Not exposed to fumes, chemicals, but is exposed  to dust at his job no HEPA filter in the home and home is not near an [place] area. Smoking: Prior smoker 1 pack/month for many years, still socially smokes when he drinks. Occupation: Freight forwarder, also is a Primary school teacher.  Retired after 20 years in the [employer] is an E7.  Environmental History: Water Damage/mildew in the house: yes Carpet in the family room: yes Carpet in the bedroom: yes Heating: electric Cooling: central Pet: no  Family History: Family History  Problem Relation Age of Onset   Hyperlipidemia Mother    Hypertension Mother    Hyperlipidemia  Father    Hypertension Father    Heart attack Father    Eczema Sister    Hyperlipidemia Sister    Hypertension Sister    Hypertension Brother    Hyperlipidemia Brother    Diabetes Brother    Heart attack Paternal Uncle    Sudden death Neg Hx      ROS: All others negative except as noted per HPI.   Objective: BP 138/88   Pulse 76   Temp 97.9 F (36.6 C) (Temporal)   Resp 18   Ht 6' 1.5" (1.867 m)   Wt 222 lb 14.4 oz (101.1 kg)   SpO2 97%   BMI 29.01 kg/m  Body mass index is 29.01 kg/m.  General Appearance:  Alert, cooperative, no distress, appears stated age  Head:  Normocephalic, without obvious abnormality, atraumatic  Eyes:  Conjunctiva clear, EOM's intact  Nose: Nares normal,  edematous nasal mucosa, hypertrophic turbinates, no visible anterior polyps, and septum midline  Throat: Lips, tongue normal; teeth and gums normal, normal posterior oropharynx  Neck: Supple, symmetrical  Lungs:   clear to auscultation bilaterally, Respirations unlabored, no coughing  Heart:  regular rate and rhythm and no murmur, Appears well perfused  Extremities: No edema  Skin: Skin color, texture, turgor normal, no rashes or lesions on visualized portions of skin  Neurologic: No gross deficits   The plan was reviewed with the patient/family, and all questions/concerned were addressed.  It was my pleasure to see  Fin today and participate in his care. Please feel free to contact me with any questions or concerns.  Sincerely,  Roney Marion, MD Allergy & Immunology  [facility] office: [phone] [facility] office: [phone]

## 2023-01-11 NOTE — Patient Instructions (Signed)
Mild Persistent  Asthma: not well  Controlled  - your lung testing today showed in full patient in your lungs that was reversible.  This is diagnostic of asthma  PLAN:  - Spacer sample and demonstration provided. - Controller Inhaler: Start Symbicort 2 puffs twice a day; This Should Be Used Everyday - Rinse mouth out after use - Rescue Inhaler: Albuterol (Proair/Ventolin) 2 puffs . Use  every 4-6 hours as needed for chest tightness, wheezing, or coughing.  Can also use 15 minutes prior to exercise if you have symptoms with activity. - Asthma is not controlled if:  - Symptoms are occurring >2 times a week OR  - >2 times a month nighttime awakenings  - You are requiring systemic steroids (prednisone/steroid injections) more than once per year  - Your require hospitalization for your asthma.  - Please call the clinic to schedule a follow up if these symptoms arise  Chronic Rhinitis  likely allergic  : - allergy testing today: deferred due to recent antihistamines   - Symptom control: - Continue Nasal Steroid Spray: Best results if used daily. - Options include Flonase (fluticasone), Nasocort (triamcinolone), Nasonex (mometasome) 1- 2 sprays in each nostril daily.  - All can be purchased over-the-counter if not covered by insurance. - Continue Singulair (Montelukast) '10mg'$  nightly.   - Discontinue if nightmares of behavior changes. - Continue Antihistamine: daily or daily as needed.   -Options include Zyrtec (Cetirizine) '10mg'$ , Claritin (Loratadine) '10mg'$ , Allegra (Fexofenadine) '180mg'$ , or Xyzal (Levocetirinze) '5mg'$  - Can be purchased over-the-counter if not covered by insurance.  Allergic Conjunctivitis:  - Consider Allergy Eye drops-great options include Pataday (Olopatadine) or Zaditor (ketotifen) for eye symptoms daily as needed-both sold over the counter if not covered by insurance. and Rewetting Drops such as Systane,TheraTears, etc  -Avoid eye drops that say red eye relief as they may  contain medications that dry out your eyes.   Follow up: for skin testing - Will need to hold antihistamines (claritin, zyrtec, allegra) for 3 days prior, can continue all other medications   Thank you so much for letting me partake in your care today.  Don't hesitate to reach out if you have any additional concerns!  Roney Marion, MD  [facility] [place]

## 2023-01-19 NOTE — Progress Notes (Signed)
Date of Service/Encounter:  01/20/23  Allergy testing appointment   Initial visit on 01/11/23, seen for mild persisntent asthma and allergic rhinitis.  Please see that note for additional details.  Today reports for allergy diagnostic testing:    DIAGNOSTICS:  Skin Testing: Environmental allergy panel. Adequate positive and negative controls Results discussed with patient/family.  Airborne Adult Perc - 01/20/23 1011     Time Antigen Placed 1011    Allergen Manufacturer Lavella Hammock    Location Back    Number of Test 59    1. Control-Buffer 50% Glycerol Negative    2. Control-Histamine 1 mg/ml 3+    3. Albumin saline Negative    4. Wise Negative    5. [place] Negative    6. Johnson Negative    7. Villalba Blue Negative    8. Meadow Fescue Negative    9. Perennial Rye Negative    10. Sweet Vernal Negative    11. Timothy Negative    12. Cocklebur Negative    13. Burweed Marshelder Negative    14. Ragweed, short Negative    15. Ragweed, Giant Negative    16. Plantain,  English Negative    17. Lamb's Quarters Negative    18. Sheep Sorrell Negative    19. Rough Pigweed Negative    20. Marsh Elder, Rough Negative    21. Mugwort, Common Negative    22. Ash mix Negative    23. Birch mix Negative    24. Beech American Negative    25. Box, Elder Negative    26. Cedar, red Negative    27. Cottonwood, [place] Negative    28. Elm mix Negative    29. Hickory Negative    30. Maple mix Negative    31. Oak, [place] mix Negative    32. Pecan Pollen Negative    33. Pine mix Negative    34. Sycamore Eastern Negative    35. [place], Black Pollen Negative    36. Alternaria alternata Negative    37. Cladosporium Herbarum Negative    38. Aspergillus mix Negative    39. Penicillium mix Negative    40. Bipolaris sorokiniana (Helminthosporium) Negative    41. Drechslera spicifera (Curvularia) Negative    42. Mucor plumbeus Negative    43. Fusarium moniliforme Negative    44.  Aureobasidium pullulans (pullulara) Negative    45. Rhizopus oryzae Negative    46. Botrytis cinera Negative    47. Epicoccum nigrum Negative    48. Phoma betae Negative    49. Candida Albicans Negative    50. Trichophyton mentagrophytes Negative    51. Mite, D Farinae  5,000 AU/ml 3+    52. Mite, D Pteronyssinus  5,000 AU/ml Negative    53. Cat Hair 10,000 BAU/ml Negative    54.  Dog Epithelia Negative    55. Mixed Feathers Negative    56. Horse Epithelia Negative    57. Cockroach, German Negative    58. Mouse Negative    59. Tobacco Leaf Negative             Intradermal - 01/20/23 1052     Time Antigen Placed 1052    Allergen Manufacturer Lavella Hammock    Location Arm    Number of Test 14    Intradermal Select    Control Negative    [place] 3+    7 Grass Negative    Ragweed mix Negative    Weed mix 3+    Tree mix 3+  Mold 1 Negative    Mold 2 Negative    Mold 3 Negative    Mold 4 Negative    Cat Negative    Dog Negative    Cockroach 3+             Allergy testing results were read and interpreted by myself, documented by clinical staff.  Patient provided with copy of allergy testing along with avoidance measures when indicated.   Sigurd Sos, MD  [facility] [place]

## 2023-01-20 ENCOUNTER — Encounter: Payer: Self-pay | Admitting: Internal Medicine

## 2023-01-20 ENCOUNTER — Ambulatory Visit (INDEPENDENT_AMBULATORY_CARE_PROVIDER_SITE_OTHER): Payer: Managed Care, Other (non HMO) | Admitting: Internal Medicine

## 2023-01-20 DIAGNOSIS — J302 Other seasonal allergic rhinitis: Secondary | ICD-10-CM | POA: Diagnosis not present

## 2023-01-20 DIAGNOSIS — J3089 Other allergic rhinitis: Secondary | ICD-10-CM

## 2023-02-02 ENCOUNTER — Other Ambulatory Visit: Payer: Self-pay | Admitting: Family

## 2023-02-13 ENCOUNTER — Encounter: Payer: Self-pay | Admitting: *Deleted

## 2023-03-06 ENCOUNTER — Ambulatory Visit: Payer: Managed Care, Other (non HMO) | Admitting: Internal Medicine

## 2023-03-10 ENCOUNTER — Ambulatory Visit: Payer: Managed Care, Other (non HMO) | Admitting: Family Medicine

## 2023-03-10 ENCOUNTER — Encounter: Payer: Self-pay | Admitting: Family Medicine

## 2023-03-10 ENCOUNTER — Other Ambulatory Visit (HOSPITAL_BASED_OUTPATIENT_CLINIC_OR_DEPARTMENT_OTHER): Payer: Self-pay

## 2023-03-10 VITALS — BP 134/82 | HR 73 | Ht 73.5 in | Wt 228.0 lb

## 2023-03-10 DIAGNOSIS — Z1211 Encounter for screening for malignant neoplasm of colon: Secondary | ICD-10-CM | POA: Diagnosis not present

## 2023-03-10 DIAGNOSIS — F4321 Adjustment disorder with depressed mood: Secondary | ICD-10-CM

## 2023-03-10 DIAGNOSIS — G479 Sleep disorder, unspecified: Secondary | ICD-10-CM | POA: Diagnosis not present

## 2023-03-10 DIAGNOSIS — R030 Elevated blood-pressure reading, without diagnosis of hypertension: Secondary | ICD-10-CM | POA: Diagnosis not present

## 2023-03-10 MED ORDER — HYDROXYZINE PAMOATE 25 MG PO CAPS
25.0000 mg | ORAL_CAPSULE | Freq: Three times a day (TID) | ORAL | 0 refills | Status: DC | PRN
  Filled 2023-03-10: qty 30, 10d supply, fill #0

## 2023-03-10 NOTE — Patient Instructions (Signed)
Recheck of BP is much better. Adding hydroxyzine for you to use as needed for anxiousness. This will likely make you sleepy.  Try to get some rest, continue with healthy lifestyle choices, and talk to someone about your grief - let me know if you decide you want a referral.   Please contact office for follow-up if symptoms do not improve or worsen. Seek emergency care if symptoms become severe.

## 2023-03-10 NOTE — Progress Notes (Signed)
Acute Office Visit  Subjective:     Patient ID: Allen Sawyer, male    DOB: 1961/04/01, 62 y.o.   MRN: [id number]  Chief Complaint  Patient presents with   Medical Management of Chronic Issues    HPI Patient is in today for elevated blood pressures.   Patient reports that he has been getting really high BP readings at home the past few weeks. Yesterday at work he could tell it was high and ended up going home and resting. States he has been under a lot of stress the past few weeks - his brother passed about a month ago. He has not been sleeping good, has trouble getting his mind to settle down at night. He feels that if he could get some quality sleep and be able to take a little break from work (has been working 12-14 hour shifts all week), then he would be feeling better. He worked really hard to get a healthy lifestyle and stay off of blood pressure meds, so he would like to try conservative measures for now. He admits to slacking on his diet shortly after brother died, stress eating, but has spent the last week or two trying to get back on track. Patient denies any chest pain, palpitations, dyspnea, wheezing, edema, recurrent headaches, vision changes.        ROS All review of systems negative except what is listed in the HPI      Objective:    BP 134/82   Pulse 73   Ht 6' 1.5" (1.867 m)   Wt 228 lb (103.4 kg)   SpO2 97%   BMI 29.67 kg/m    Physical Exam Vitals reviewed.  Constitutional:      Appearance: Normal appearance.  Cardiovascular:     Rate and Rhythm: Normal rate and regular rhythm.     Pulses: Normal pulses.     Heart sounds: Normal heart sounds.  Pulmonary:     Effort: Pulmonary effort is normal.     Breath sounds: Normal breath sounds.  Skin:    General: Skin is warm and dry.  Neurological:     Mental Status: He is alert and oriented to person, place, and time.  Psychiatric:        Mood and Affect: Mood normal.        Behavior: Behavior normal.         Thought Content: Thought content normal.        Judgment: Judgment normal.     No results found for any visits on 03/10/23.      Assessment & Plan:   Problem List Items Addressed This Visit   None Visit Diagnoses     Screen for colon cancer    -  Primary   Relevant Orders   Ambulatory referral to Gastroenterology   Elevated blood pressure reading       Trouble in sleeping       Relevant Medications   hydrOXYzine (VISTARIL) 25 MG capsule   Feeling grief       Relevant Medications   hydrOXYzine (VISTARIL) 25 MG capsule      Recheck of BP is much better. Adding hydroxyzine for you to use as needed for anxiousness. This will likely make you sleepy.  Try to get some rest, continue with healthy lifestyle choices, and talk to someone about your grief - let me know if you decide you want a referral.   Please contact office for follow-up if symptoms do not improve or  worsen. Seek emergency care if symptoms become severe.     Meds ordered this encounter  Medications   hydrOXYzine (VISTARIL) 25 MG capsule    Sig: Take 1 capsule (25 mg total) by mouth every 8 (eight) hours as needed for anxiety.    Dispense:  30 capsule    Refill:  0    Order Specific Question:   Supervising Provider    Answer:   Danise Edge A [[id number]]    Return if symptoms worsen or fail to improve.  Clayborne Dana, NP

## 2023-03-21 ENCOUNTER — Other Ambulatory Visit (HOSPITAL_BASED_OUTPATIENT_CLINIC_OR_DEPARTMENT_OTHER): Payer: Self-pay

## 2023-03-22 ENCOUNTER — Ambulatory Visit: Payer: Managed Care, Other (non HMO) | Admitting: Family Medicine

## 2023-03-22 ENCOUNTER — Encounter: Payer: Self-pay | Admitting: Family Medicine

## 2023-03-22 VITALS — BP 137/81 | HR 76 | Ht 73.5 in | Wt 230.0 lb

## 2023-03-22 DIAGNOSIS — Z131 Encounter for screening for diabetes mellitus: Secondary | ICD-10-CM

## 2023-03-22 DIAGNOSIS — Z1329 Encounter for screening for other suspected endocrine disorder: Secondary | ICD-10-CM

## 2023-03-22 DIAGNOSIS — Z13220 Encounter for screening for lipoid disorders: Secondary | ICD-10-CM

## 2023-03-22 DIAGNOSIS — Z9189 Other specified personal risk factors, not elsewhere classified: Secondary | ICD-10-CM | POA: Diagnosis not present

## 2023-03-22 DIAGNOSIS — Z0000 Encounter for general adult medical examination without abnormal findings: Secondary | ICD-10-CM

## 2023-03-22 NOTE — Progress Notes (Signed)
   Acute Office Visit  Subjective:     Patient ID: Allen Sawyer, male    DOB: 1961-06-30, 62 y.o.   MRN: [id number]  Chief Complaint  Patient presents with   Medical Management of Chronic Issues    HPI Patient is in today for work letter.   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 He would like to try working with noise-cancelling headphones. 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     ROS All review of systems negative except what is listed in the HPI      Objective:    BP 137/81   Pulse 76   Ht 6' 1.5" (1.867 m)   Wt 230 lb (104.3 kg)   SpO2 98%   BMI 29.93 kg/m    Physical Exam Vitals reviewed.  Constitutional:      Appearance: Normal appearance.  Cardiovascular:     Rate and Rhythm: Normal rate and regular rhythm.     Pulses: Normal pulses.     Heart sounds: Normal heart sounds.  Pulmonary:     Effort: Pulmonary effort is normal.     Breath sounds: Normal breath sounds.  Skin:    General: Skin is warm and dry.  Neurological:     Mental Status: He is alert and oriented to person, place, and time.  Psychiatric:        Mood and Affect: Mood normal.        Behavior: Behavior normal.        Thought Content: Thought content normal.        Judgment: Judgment normal.     No results found for any visits on 03/22/23.      Assessment & Plan:   Problem List Items Addressed This Visit   None Visit Diagnoses     At risk for hearing loss    -  Primary Patient aware of risks in working in loud environments. He would like to be able to wear noise-cancelling headphones at work. Work note provided requesting this accomodation.        No orders of the defined types were placed in this encounter.   Return for physical as soon as available .  Clayborne Dana,  NP

## 2023-03-31 ENCOUNTER — Encounter: Payer: Self-pay | Admitting: Family Medicine

## 2023-03-31 ENCOUNTER — Ambulatory Visit: Payer: Managed Care, Other (non HMO) | Admitting: Family Medicine

## 2023-03-31 ENCOUNTER — Ambulatory Visit: Payer: Managed Care, Other (non HMO) | Admitting: Medical

## 2023-03-31 VITALS — BP 138/76 | HR 73 | Temp 97.4°F | Ht 74.0 in | Wt 231.4 lb

## 2023-03-31 DIAGNOSIS — M23203 Derangement of unspecified medial meniscus due to old tear or injury, right knee: Secondary | ICD-10-CM

## 2023-03-31 NOTE — Progress Notes (Signed)
Musculoskeletal Exam  Patient: Allen Sawyer DOB: [birth year]  DOS: 03/31/2023  SUBJECTIVE:  Chief Complaint:   Chief Complaint  Patient presents with   Knee Pain    Right     Allen Sawyer is a 62 y.o.  male for evaluation and treatment of R knee pain.   Onset:  7 months ago. No inj or change in activity.  Location: R knee Character:  aching  Progression of issue:  has worsened Associated symptoms: some swelling Treatment: to date has been ice, OTC NSAIDS, PT, and home exercises.   Neurovascular symptoms: no  Past Medical History:  Diagnosis Date   Sleep apnea     Objective: VITAL SIGNS: BP 138/76 (BP Location: Left Arm, Cuff Size: Large)   Pulse 73   Temp (!) 97.4 F (36.3 C) (Oral)   Ht 6\' 2"  (1.88 m)   Wt 231 lb 6 oz (105 kg)   SpO2 93%   BMI 29.71 kg/m  Constitutional: Well formed, well developed. No acute distress. Thorax & Lungs: No accessory muscle use Musculoskeletal: R knee.   Normal active range of motion: yes.   Normal passive range of motion: yes Tenderness to palpation: yes along medial jt line Deformity: no Ecchymosis: no Tests positive: none Tests negative: Patellar app/grind, Lachman's, Stine's, varus/valgus stress Neurologic: Normal sensory function. No focal deficits noted. DTR's equal and symmetric in LE's. No clonus. Psychiatric: Normal mood. Age appropriate judgment and insight. Alert & oriented x 3.    Assessment:  Degenerative tear of medial meniscus of right knee  Plan: Stretches/exercises for mensicus, heat, ice, Tylenol. Not having mechanical symptoms. 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 Could consider injection if no better.  F/u as originally scheduled. The patient voiced understanding and agreement to the plan.   Allen Sawyer [place], DO 03/31/23  3:51 PM

## 2023-03-31 NOTE — Patient Instructions (Addendum)
Ice/cold pack over area for 10-15 min twice daily.  OK to take Tylenol 1000 mg (2 extra strength tabs) or 975 mg (3 regular strength tabs) every 6 hours as needed.  I want your blood pressure less than 140 on the top and less than 90 on the bottom consistently. Both goals must be met (ie, 150/70 is too high even though the 70 on the bottom is desirable).  If it is not controlled, let me know.   Let us know if you need anything.

## 2023-04-12 ENCOUNTER — Ambulatory Visit (INDEPENDENT_AMBULATORY_CARE_PROVIDER_SITE_OTHER): Payer: Managed Care, Other (non HMO) | Admitting: Family Medicine

## 2023-04-12 ENCOUNTER — Encounter: Payer: Self-pay | Admitting: Family Medicine

## 2023-04-12 VITALS — BP 139/82 | HR 70 | Ht 74.0 in | Wt 230.0 lb

## 2023-04-12 DIAGNOSIS — Z1211 Encounter for screening for malignant neoplasm of colon: Secondary | ICD-10-CM

## 2023-04-12 DIAGNOSIS — Z131 Encounter for screening for diabetes mellitus: Secondary | ICD-10-CM | POA: Diagnosis not present

## 2023-04-12 DIAGNOSIS — Z13220 Encounter for screening for lipoid disorders: Secondary | ICD-10-CM

## 2023-04-12 DIAGNOSIS — J302 Other seasonal allergic rhinitis: Secondary | ICD-10-CM | POA: Diagnosis not present

## 2023-04-12 DIAGNOSIS — Z0000 Encounter for general adult medical examination without abnormal findings: Secondary | ICD-10-CM

## 2023-04-12 DIAGNOSIS — Z1329 Encounter for screening for other suspected endocrine disorder: Secondary | ICD-10-CM | POA: Diagnosis not present

## 2023-04-12 DIAGNOSIS — Z0001 Encounter for general adult medical examination with abnormal findings: Secondary | ICD-10-CM | POA: Diagnosis not present

## 2023-04-12 DIAGNOSIS — Z125 Encounter for screening for malignant neoplasm of prostate: Secondary | ICD-10-CM | POA: Diagnosis not present

## 2023-04-12 DIAGNOSIS — R7989 Other specified abnormal findings of blood chemistry: Secondary | ICD-10-CM

## 2023-04-12 MED ORDER — LEVOCETIRIZINE DIHYDROCHLORIDE 5 MG PO TABS: 5.0000 mg | ORAL_TABLET | Freq: Every evening | ORAL | 1 refills | Status: DC

## 2023-04-12 NOTE — Addendum Note (Signed)
Addended by: Hyman Hopes B on: 04/12/2023 03:08 PM   Modules accepted: Orders

## 2023-04-12 NOTE — Progress Notes (Addendum)
Complete physical exam  Patient: Allen Sawyer   DOB: 1961/06/07   62 y.o. Male  MRN: [id number]  Subjective:    Chief Complaint  Patient presents with   Annual Exam    Allen Sawyer is a 62 y.o. male who presents today for a complete physical exam. He reports consuming a general diet. Gym/ health club routine includes cardio, weights. He generally feels well. He reports sleeping well. He does not have additional problems to discuss today.   Currently lives with: alone Acute concerns or interim problems since last visit: States he recently missed a few days of work due to allergy flare. He is scheduled to return on Friday.   Vision concerns: no concerns Dental concerns: no concerns STD concerns: no concerns  Patient endorses ETOH use. - 6-7 servings per week Patient denies nicotine use. Patient denies illegal substance use.   PSA: - requesting screening today; brother had prostate cancer        Most recent fall risk assessment:    04/12/2023    2:49 PM  Fall Risk   Falls in the past year? 0  Number falls in past yr: 0  Injury with Fall? 0  Risk for fall due to : No Fall Risks  Follow up Falls evaluation completed     Most recent depression screenings:    04/12/2023    2:49 PM 03/31/2023    3:30 PM  PHQ 2/9 Scores  PHQ - 2 Score 0 0  PHQ- 9 Score 1             Patient Care Team: Clayborne Dana, NP as PCP - General (Family Medicine)   Outpatient Medications Prior to Visit  Medication Sig   albuterol (VENTOLIN HFA) 108 (90 Base) MCG/ACT inhaler INHALE 2 PUFFS INTO THE LUNGS EVERY 6 HOURS AS NEEDED FOR WHEEZING OR SHORTNESS OF BREATH   budesonide-formoterol (SYMBICORT) 80-4.5 MCG/ACT inhaler Inhale 2 puffs into the lungs in the morning and at bedtime.   fluticasone (FLONASE) 50 MCG/ACT nasal spray Place 2 sprays into both nostrils daily.   hydrOXYzine (VISTARIL) 25 MG capsule Take 1 capsule (25 mg total) by mouth every 8 (eight) hours as needed for  anxiety.   montelukast (SINGULAIR) 10 MG tablet Take 1 tablet (10 mg total) by mouth at bedtime.   [DISCONTINUED] levocetirizine (XYZAL) 5 MG tablet Take 1 tablet (5 mg total) by mouth every evening.   [DISCONTINUED] loratadine-pseudoephedrine (CLARITIN-D 24-HOUR) 10-240 MG 24 hr tablet Take 1 tablet by mouth daily.   No facility-administered medications prior to visit.    ROS All review of systems negative except what is listed in the HPI        Objective:     BP 139/82   Pulse 70   Ht 6\' 2"  (1.88 m)   Wt 230 lb (104.3 kg)   SpO2 99%   BMI 29.53 kg/m    Physical Exam Vitals reviewed.  Constitutional:      General: He is not in acute distress.    Appearance: Normal appearance. He is not ill-appearing.  HENT:     Head: Normocephalic and atraumatic.     Right Ear: Tympanic membrane normal.     Left Ear: Tympanic membrane normal.     Nose: Nose normal.     Mouth/Throat:     Mouth: Mucous membranes are moist.     Pharynx: Oropharynx is clear.  Eyes:     Extraocular Movements: Extraocular movements intact.  Conjunctiva/sclera: Conjunctivae normal.     Pupils: Pupils are equal, round, and reactive to light.  Neck:     Vascular: No carotid bruit.  Cardiovascular:     Rate and Rhythm: Normal rate and regular rhythm.     Pulses: Normal pulses.     Heart sounds: Normal heart sounds.  Pulmonary:     Effort: Pulmonary effort is normal.     Breath sounds: Normal breath sounds.  Abdominal:     General: Abdomen is flat. Bowel sounds are normal. There is no distension.     Palpations: Abdomen is soft. There is no mass.     Tenderness: There is no abdominal tenderness. There is no right CVA tenderness, left CVA tenderness, guarding or rebound.  Genitourinary:    Comments: Deferred exam Musculoskeletal:        General: Normal range of motion.     Cervical back: Normal range of motion and neck supple. No tenderness.     Right lower leg: No edema.     Left lower leg: No  edema.  Lymphadenopathy:     Cervical: No cervical adenopathy.  Skin:    General: Skin is warm and dry.     Capillary Refill: Capillary refill takes less than 2 seconds.  Neurological:     General: No focal deficit present.     Mental Status: He is alert and oriented to person, place, and time. Mental status is at baseline.  Psychiatric:        Mood and Affect: Mood normal.        Behavior: Behavior normal.        Thought Content: Thought content normal.        Judgment: Judgment normal.      No results found for any visits on 04/12/23.     Assessment & Plan:    Routine Health Maintenance and Physical Exam Discussed health promotion and safety including diet and exercise recommendations, dental health, and injury prevention. Tobacco cessation if applicable. Seat belts, sunscreen, smoke detectors, etc.    Immunization History  Administered Date(s) Administered   PFIZER(Purple Top)SARS-COV-2 Vaccination 01/13/2020, 02/03/2020, 10/26/2020    Health Maintenance  Topic Date Due   Colonoscopy  Never done   Zoster Vaccines- Shingrix (1 of 2) 06/10/2023 (Originally 05/06/2013)   COVID-19 Vaccine (4 - 2023-24 season) 03/09/2024 (Originally 07/01/2022)   INFLUENZA VACCINE  06/01/2023   Hepatitis C Screening  Completed   HIV Screening  Completed   HPV VACCINES  Aged Out   DTaP/Tdap/Td  Discontinued        Problem List Items Addressed This Visit     Seasonal allergies    Reports he recently ran out of his Xyzal and ended up with significant allergy symptoms causing him to miss work [employer] of this week.  Refill provided Work note stating what he told me - advised that for future work notes, he needs to come in for acute visit.       Relevant Medications   levocetirizine (XYZAL) 5 MG tablet   Preventative health care - Primary   Relevant Orders   CBC with Differential/Platelet   Comprehensive metabolic panel   Lipid panel   TSH   Ambulatory referral to  Gastroenterology   PSA   Testosterone   Other Visit Diagnoses     Lipid screening       Relevant Orders   Lipid panel   Diabetes mellitus screening       Relevant Orders   Comprehensive  metabolic panel   Thyroid disorder screen       Relevant Orders   TSH   Colon cancer screening       Relevant Orders   Ambulatory referral to Gastroenterology   Prostate cancer screening       Relevant Orders   PSA   Decreased testosterone level     Testosterone has been borderline low in the past. He would like this rechecked. Ordered for future morning lab draw.    Relevant Orders   Testosterone      Return in about 1 year (around 04/11/2024) for physical.     Clayborne Dana, NP

## 2023-04-12 NOTE — Assessment & Plan Note (Signed)
Reports he recently ran out of his Xyzal and ended up with significant allergy symptoms causing him to miss work [employer] of this week.  Refill provided Work note stating what he told me - advised that for future work notes, he needs to come in for acute visit.

## 2023-04-13 LAB — COMPREHENSIVE METABOLIC PANEL
ALT: 19 U/L (ref 0–53)
AST: 22 U/L (ref 0–37)
Albumin: 4.1 g/dL (ref 3.5–5.2)
Alkaline Phosphatase: 55 U/L (ref 39–117)
BUN: 14 mg/dL (ref 6–23)
CO2: 25 mEq/L (ref 19–32)
Calcium: 9.1 mg/dL (ref 8.4–10.5)
Chloride: 106 mEq/L (ref 96–112)
Creatinine, Ser: 1 mg/dL (ref 0.40–1.50)
GFR: 82.14 mL/min (ref >60.00)
Glucose, Bld: 82 mg/dL (ref 70–99)
Potassium: 4 mEq/L (ref 3.5–5.1)
Sodium: 140 mEq/L (ref 135–145)
Total Bilirubin: 0.6 mg/dL (ref 0.2–1.2)
Total Protein: 7.4 g/dL (ref 6.0–8.3)

## 2023-04-13 LAB — CBC WITH DIFFERENTIAL/PLATELET
Basophils Absolute: 0 10*3/uL (ref 0.0–0.1)
Basophils Relative: 0.5 % (ref 0.0–3.0)
Eosinophils Absolute: 0.1 10*3/uL (ref 0.0–0.7)
Eosinophils Relative: 2.2 % (ref 0.0–5.0)
HCT: 44.9 % (ref 39.0–52.0)
Hemoglobin: 14.7 g/dL (ref 13.0–17.0)
Lymphocytes Relative: 56.5 % — ABNORMAL HIGH (ref 12.0–46.0)
Lymphs Abs: 2.3 10*3/uL (ref 0.7–4.0)
MCHC: 32.7 g/dL (ref 30.0–36.0)
MCV: 100.2 fl — ABNORMAL HIGH (ref 78.0–100.0)
Monocytes Absolute: 0.6 10*3/uL (ref 0.1–1.0)
Monocytes Relative: 14.6 % — ABNORMAL HIGH (ref 3.0–12.0)
Neutro Abs: 1.1 10*3/uL — ABNORMAL LOW (ref 1.4–7.7)
Neutrophils Relative %: 26.2 % — ABNORMAL LOW (ref 43.0–77.0)
Platelets: 178 10*3/uL (ref 150.0–400.0)
RBC: 4.48 Mil/uL (ref 4.22–5.81)
RDW: 13 % (ref 11.5–15.5)
WBC: 4.1 10*3/uL (ref 4.0–10.5)

## 2023-04-13 LAB — PSA: PSA: 0.34 ng/mL (ref 0.10–4.00)

## 2023-04-13 LAB — LIPID PANEL
Cholesterol: 151 mg/dL (ref 0–200)
HDL: 46.2 mg/dL (ref >39.00)
LDL Cholesterol: 80 mg/dL (ref 0–99)
NonHDL: 104.88
Total CHOL/HDL Ratio: 3
Triglycerides: 122 mg/dL (ref 0.0–149.0)
VLDL: 24.4 mg/dL (ref 0.0–40.0)

## 2023-04-13 LAB — TSH: TSH: 1.52 u[IU]/mL (ref 0.35–5.50)

## 2023-04-13 NOTE — Addendum Note (Signed)
Addended by: Hyman Hopes B on: 04/13/2023 01:01 PM   Modules accepted: Orders

## 2023-04-18 ENCOUNTER — Encounter: Payer: Self-pay | Admitting: Family Medicine

## 2023-04-18 ENCOUNTER — Ambulatory Visit: Payer: Managed Care, Other (non HMO) | Admitting: Family Medicine

## 2023-04-18 VITALS — BP 142/76 | HR 63 | Temp 98.2°F | Ht 74.0 in | Wt 232.0 lb

## 2023-04-18 DIAGNOSIS — J3089 Other allergic rhinitis: Secondary | ICD-10-CM

## 2023-04-18 MED ORDER — AZITHROMYCIN 250 MG PO TABS
ORAL_TABLET | ORAL | 0 refills | Status: AC
Start: 2023-04-18 — End: 2023-04-23

## 2023-04-18 NOTE — Progress Notes (Signed)
Acute Office Visit  Subjective:     Patient ID: Allen Sawyer, male    DOB: 13-Aug-1961, 62 y.o.   MRN: [id number]  Chief Complaint  Patient presents with   Headache     Patient is in today for headache, ear discomfort, sinus congestion.  Discussed the use of AI scribe software for clinical note transcription with the patient, who gave verbal consent to proceed.  History of Present Illness   The patient, with a history of allergies, presents with recurrent symptoms of sinus congestion and fatigue. He reports feeling 'clammy and cold' with chills, but deny fever. He also reports feeling extremely tired, especially after returning to work. He describes a sensation of pressure in both ears, but deny any pain. He also reports having had a runny nose and headaches, but these symptoms have since resolved. He deny any sinus pressure at present. He also reports regular bowel movements, particularly after eating.  The patient's symptoms seem to improve when he is not at work, but recur after returning to work. He works in a [employer] operational [employer], which he describe as not being the [employer]. He has previously tested positive for allergies to cockroaches, mites, some weeds and trees, but deny any exposure to cockroaches in his living environment. He has not been adhering to his prescribed allergy regimen, which includes Flonase and montelukast.           All review of systems negative except what is listed in the HPI      Objective:    BP (!) 142/76   Pulse 63   Temp 98.2 F (36.8 C) (Oral)   Ht 6\' 2"  (1.88 m)   Wt 232 lb (105.2 kg)   SpO2 98%   BMI 29.79 kg/m    Physical Exam Vitals reviewed.  Constitutional:      Appearance: Normal appearance.  HENT:     Right Ear: Tympanic membrane is bulging.     Ears:     Comments: R TM cloudy Cardiovascular:     Rate and Rhythm: Normal rate and regular rhythm.     Pulses: Normal pulses.     Heart sounds: Normal heart sounds.   Pulmonary:     Effort: Pulmonary effort is normal.     Breath sounds: Normal breath sounds.  Skin:    General: Skin is warm and dry.  Neurological:     Mental Status: He is alert and oriented to person, place, and time.  Psychiatric:        Mood and Affect: Mood normal.        Behavior: Behavior normal.        Thought Content: Thought content normal.        Judgment: Judgment normal.     No results found for any visits on 04/18/23.      Assessment & Plan:   Problem List Items Addressed This Visit   None Visit Diagnoses     Non-seasonal allergic rhinitis, unspecified trigger    -  Primary   Relevant Medications   azithromycin (ZITHROMAX) 250 MG tablet     Pick up your Flonase and Singulair. Continue Xyzal. Due to schedule a follow-up with Allergist  Focus on preventative/maintenance allergy measures Adding Zpak for double sickening and possible early right ear infection  BP elevated, but could be due to not feeling well - please monitor at home and follow-up if SBP consistently >140  Meds ordered this encounter  Medications   azithromycin (ZITHROMAX) 250 MG  tablet    Sig: Take 2 tablets on day 1, then 1 tablet daily on days 2 through 5    Dispense:  6 tablet    Refill:  0    Order Specific Question:   Supervising Provider    Answer:   Bradd Canary [[id number]]    Return for schedule AM lab appointment for next week .  Clayborne Dana, NP

## 2023-04-18 NOTE — Patient Instructions (Addendum)
Pick up your Flonase and Singulair. Continue Xyzal. Due to schedule a follow-up with Allergist  Focus on preventative/maintenance allergy measures Adding Zpak for double sickening and possible early right ear infection

## 2023-04-25 ENCOUNTER — Encounter: Payer: Self-pay | Admitting: Family Medicine

## 2023-04-25 ENCOUNTER — Ambulatory Visit: Payer: Managed Care, Other (non HMO) | Admitting: Family Medicine

## 2023-04-25 ENCOUNTER — Other Ambulatory Visit: Payer: Managed Care, Other (non HMO)

## 2023-04-25 VITALS — BP 124/84 | HR 68 | Temp 97.9°F | Ht 74.0 in | Wt 229.0 lb

## 2023-04-25 DIAGNOSIS — J302 Other seasonal allergic rhinitis: Secondary | ICD-10-CM

## 2023-04-25 DIAGNOSIS — J3089 Other allergic rhinitis: Secondary | ICD-10-CM | POA: Diagnosis not present

## 2023-04-25 NOTE — Assessment & Plan Note (Signed)
Advised that I cannot continue writing him out of work for allergy symptoms. He needs to follow-up with his allergist if symptoms are that debilitating. Discussed importance of taking allergy/asthma regimen as prescribed and avoiding known exposures.  Patient aware of signs/symptoms requiring further/urgent evaluation.

## 2023-04-25 NOTE — Patient Instructions (Addendum)
Schedule a follow-up with your allergist [phone] Make sure you are taking your allergy/asthma medications as prescribed.

## 2023-04-25 NOTE — Progress Notes (Unsigned)
   Acute Office Visit  Subjective:     Patient ID: Allen Sawyer, male    DOB: [birth year], 62 y.o.   MRN: [id number]  Chief Complaint  Patient presents with   Medical Management of Chronic Issues    HPI Patient is in today for allergies.   Discussed the use of AI scribe software for clinical note transcription with the patient, who gave verbal consent to proceed.  History of Present Illness   The patient, with a history of allergies and asthma, presents with worsening symptoms. They report feeling 'hyper' and 'stressed' due to losing their wallet and not sleeping well. 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 They report feeling 'sick as a dog' after working for a couple of days, but feel much better when they are not working. They have been prescribed Singulair, Symbicort, Xyzal, and Flonase, but admit to not using the Symbicort and Singulair as regularly as they should.         ROS All review of systems negative except what is listed in the HPI      Objective:    BP 124/84   Pulse 68   Temp 97.9 F (36.6 C) (Oral)   Ht 6\' 2"  (1.88 m)   Wt 229 lb (103.9 kg)   SpO2 98%   BMI 29.40 kg/m    Physical Exam Vitals reviewed.  Constitutional:      Appearance: Normal appearance.  Cardiovascular:     Rate and Rhythm: Normal rate and regular rhythm.     Pulses: Normal pulses.     Heart sounds: Normal heart sounds.  Pulmonary:     Effort: Pulmonary effort is normal.     Breath sounds: Normal breath sounds.  Skin:    General: Skin is warm and dry.  Neurological:     Mental Status: He is alert and oriented to person, place, and time.  Psychiatric:        Mood and Affect: Mood normal.        Behavior: Behavior normal.        Thought Content: Thought content normal.        Judgment: Judgment normal.     No results found for any visits on 04/25/23.      Assessment & Plan:    Problem List Items Addressed This Visit     Seasonal and perennial allergic rhinitis - Primary    Advised that I cannot continue writing him out of work for allergy symptoms. He needs to follow-up with his allergist if symptoms are that debilitating. Discussed importance of taking allergy/asthma regimen as prescribed and avoiding known exposures.  Patient aware of signs/symptoms requiring further/urgent evaluation.        No orders of the defined types were placed in this encounter.   Return if symptoms worsen or fail to improve.  Clayborne Dana, NP

## 2023-04-26 NOTE — Progress Notes (Unsigned)
FOLLOW UP Date of Service/Encounter:  04/27/23   Subjective:  Allen Sawyer (DOB: 17-Sep-1961) is a 62 y.o. male who [facility] in re-evaluation of the following: asthma, allergic rhinitis History obtained from: chart review and patient.  For Review, LV was on 01/20/23  with Dr.Shaniqwa Horsman seen for  allergy skin testing appointment . See below for summary of history and diagnostics.  ----------------------------------------------------- Pertinent History/Diagnostics:  Asthma: Sx: cough, SOB, wheezing, 1 ED visit and 1 course of OCS in 2023-2024. No prior hospitalizations, triggers: respiratory illness, social smoker. AEC 11/22/22: 100. Chest x-ray on (2 /27 /24): Trachea is midline. Heart size stable. Lungs are clear. No pleural fluid. - mixed obstructive and restrictive disease  with significant improvement postbroncodilator spirometry (12/27/22): ratio 66%, 63% FEV1  CXR 12/27/22. Allergic Rhinitis:  Started in early childhood. Sx:  nasal congestion, rhinorrhea, post nasal drainage, sneezing, and itchy eyes. Seasonally in spring.  tried: Xyzal, Flonase, Singulair, Claritin- D  No prior ENT surgeries. Has OSA on CPAP. - SPT environmental panel (01/20/23): + dust mites, intradermals + [place] grass, weed mix, tree mix, cockroach --------------------------------------------------- Today presents for follow-up. 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 He feels his work is causing him to have constant flares of his allergies. He was told by his work to take off some time to get himself together. He asked off until July 17th. After work, he will have itchy eyes, runny nose and coughing. Cough only occurs in the morning. Occasionally will wheeze, but this is very infrequent.  Mostly upon wakening. No symptoms are waking him from sleep. HE does have OSA on a CPAP.   His does mask at work using a standard dust mask, but he has a  beard so fit is not tight. He is using his Symbicort 80 mcg 2 puffs BID with a spacer. He feels his ears are congested, runny nose and PND.  He will wake up and cough it out, and then he will feel fine. He does not use his rescue inhaler.  He has been working out without any symptoms. He feels asthma is controlled.  He is having chills often.  He does take singulair.  He is not taking an antihistamine. He is not using flonase consistently. He reports he has not been prescribed antibiotics and steroids since his last visit. On review of records: 04/18/23: prescribed zpack 04/18/23.  He is stating that he is having chills and perfuse sweating. Sometimes having nightsweats. No weight loss. This started over a year ago. He does have a recent CBCd 04/12/23 ALC 56.5 (slightly elevated) with normal WBC- he was sick when this was drawn. ANC 1,100.  AEC 100.  CXR - from February  2024 normal. It appears his PCP has a repeat CBCd and testosterone level pending.  He is not interested in AIT at this time.   Allergies as of 04/27/2023   No Known Allergies      Medication List        Accurate as of April 27, 2023 12:53 PM. If you have any questions, ask your nurse or doctor.          STOP taking these medications    budesonide-formoterol 80-4.5 MCG/ACT inhaler Commonly known as: Symbicort Stopped by: Verlee Monte, MD       TAKE these medications    albuterol 108 (90 Base) MCG/ACT inhaler Commonly known as: VENTOLIN HFA INHALE 2 PUFFS INTO  THE LUNGS EVERY 6 HOURS AS NEEDED FOR WHEEZING OR SHORTNESS OF BREATH   fluticasone 50 MCG/ACT nasal spray Commonly known as: FLONASE Place 2 sprays into both nostrils daily.   hydrOXYzine 25 MG capsule Commonly known as: VISTARIL Take 1 capsule (25 mg total) by mouth every 8 (eight) hours as needed for anxiety.   levocetirizine 5 MG tablet Commonly known as: XYZAL Take 1 tablet (5 mg total) by mouth every evening.   montelukast 10 MG  tablet Commonly known as: SINGULAIR Take 1 tablet (10 mg total) by mouth at bedtime.       Past Medical History:  Diagnosis Date   Sleep apnea    Past Surgical History:  Procedure Laterality Date   APPENDECTOMY     Otherwise, there have been no changes to his past medical history, surgical history, family history, or social history.  ROS: All others negative except as noted per HPI.   Objective:  BP 126/64   Pulse 70   Temp 98.2 F (36.8 C) (Temporal)   Resp 17   Wt 231 lb 14.4 oz (105.2 kg)   SpO2 96%   BMI 29.77 kg/m  Body mass index is 29.77 kg/m. Physical Exam: General Appearance:  Alert, cooperative, no distress, appears stated age  Head:  Normocephalic, without obvious abnormality, atraumatic  Eyes:  Conjunctiva clear, EOM's intact  Nose: Nares normal, hypertrophic turbinates, normal mucosa, and no visible anterior polyps  Ears Bilateral Tms with cloudy fluid, no bulging or erythema. Normal EACs  Throat: Lips, tongue normal; teeth and gums normal, normal posterior oropharynx  Neck: Supple, symmetrical  Lungs:   clear to auscultation bilaterally, Respirations unlabored, no coughing  Heart:  regular rate and rhythm and no murmur, Appears well perfused  Extremities: No edema  Skin: Skin color, texture, turgor normal and no rashes or lesions on visualized portions of skin  Neurologic: No gross deficits   Spirometry:  Tracings reviewed. His effort: It was hard to get consistent efforts and there is a question as to whether this reflects a maximal maneuver. FVC: 3.62L pre, 3.96 L post FEV1: 2.60L, 75% predicted pre, 2.90 L, 84% post + 12% FEV1 FEV1/FVC ratio: 0.72 pre, 0.73 post Interpretation: Spirometry consistent with normal pattern. With significant improvement in FEV1 following bronchodilator. Please see scanned spirometry results for details.  Assessment/Plan  Patient with uncontrolled allergies and asthma.  Discussed AIT, but not currently interested.   Discussed round of antibiotics and steroids to see if ears and congestion clear. Possible chronic sinusitis. We are increasing his allergy medications as well as symbicort dosing.  I have advised he follow-up with PCP regarding repeat CBCd and reports of profuse sweating and night sweats as these would not be allergy symptoms.  Take Everyday:  Symbicort 160 mcg 2 puffs TWICE daily with a spacer and rinse mouth out after use Xyzal 5 mg in AM Singulair 10 mg in PM Dymista nasal spray 1 spray each nostril (point out towards ear) TWICE daily.   2. As needed:    A. Airsupra 2 puffs as needed for shortness of breath, constant cough, or wheeze. Maximum 12 puffs/day.   B. Pataday eye drops - 1 drop each eye daily as needed   Extras for today:  - 40 mg IM depomedrol - Augmentin 875 mg twice daily for 10 days -take with probiotics - start tomorrow prednisone 20 mg daily for 4 days  Others: Order new CPAP accessories-check Amazon or reach out to sleep medicine. Get a mask  that better fits your face for work. You need repeat blood work to look at cell counts-will message your PCP. It looks like she has already ordered this. Reach out to see when she wants you to get these. Consider allergy injections-will teach your immune system to become tolerant of your allergies  Note provided for employer with recommendation to find tighter fitting mask as well as today seen in clinic.   Follow up: 6-8 weeks, sooner if needed Thank you so much for letting me partake in your care today.  Don't hesitate to reach out if you have any additional concerns!  Tonny Bollman, MD  [facility]

## 2023-04-27 ENCOUNTER — Encounter: Payer: Self-pay | Admitting: Internal Medicine

## 2023-04-27 ENCOUNTER — Ambulatory Visit: Payer: Managed Care, Other (non HMO) | Admitting: Internal Medicine

## 2023-04-27 VITALS — BP 126/64 | HR 70 | Temp 98.2°F | Resp 17 | Wt 231.9 lb

## 2023-04-27 DIAGNOSIS — J3089 Other allergic rhinitis: Secondary | ICD-10-CM

## 2023-04-27 DIAGNOSIS — H1013 Acute atopic conjunctivitis, bilateral: Secondary | ICD-10-CM | POA: Diagnosis not present

## 2023-04-27 DIAGNOSIS — J302 Other seasonal allergic rhinitis: Secondary | ICD-10-CM | POA: Diagnosis not present

## 2023-04-27 DIAGNOSIS — J4531 Mild persistent asthma with (acute) exacerbation: Secondary | ICD-10-CM | POA: Diagnosis not present

## 2023-04-27 DIAGNOSIS — H1010 Acute atopic conjunctivitis, unspecified eye: Secondary | ICD-10-CM

## 2023-04-27 MED ORDER — PREDNISONE 20 MG PO TABS: 20.0000 mg | ORAL_TABLET | Freq: Every day | ORAL | 0 refills | Status: AC

## 2023-04-27 MED ORDER — OLOPATADINE HCL 0.2 % OP SOLN: 1.0000 [drp] | OPHTHALMIC | 0 refills | Status: AC | PRN

## 2023-04-27 MED ORDER — AIRSUPRA 90-80 MCG/ACT IN AERO: 2.0000 | INHALATION_SPRAY | RESPIRATORY_TRACT | 2 refills | Status: AC | PRN

## 2023-04-27 MED ORDER — AMOXICILLIN-POT CLAVULANATE 875-125 MG PO TABS: 1.0000 | ORAL_TABLET | Freq: Two times a day (BID) | ORAL | 0 refills | Status: AC

## 2023-04-27 MED ORDER — MONTELUKAST SODIUM 10 MG PO TABS: 10.0000 mg | ORAL_TABLET | Freq: Every day | ORAL | 5 refills | Status: DC

## 2023-04-27 MED ORDER — METHYLPREDNISOLONE ACETATE 40 MG/ML IJ SUSP
40.0000 mg | Freq: Once | INTRAMUSCULAR | Status: AC
  Administered 2023-04-27: 40 mg via INTRAMUSCULAR

## 2023-04-27 MED ORDER — AZELASTINE-FLUTICASONE 137-50 MCG/ACT NA SUSP: 1.0000 | Freq: Two times a day (BID) | NASAL | 5 refills | Status: DC | PRN

## 2023-04-27 MED ORDER — LEVOCETIRIZINE DIHYDROCHLORIDE 5 MG PO TABS
5.0000 mg | ORAL_TABLET | Freq: Every evening | ORAL | 1 refills | Status: DC
Start: 2023-04-27 — End: 2023-09-01

## 2023-04-27 MED ORDER — BUDESONIDE-FORMOTEROL FUMARATE 160-4.5 MCG/ACT IN AERO: 2.0000 | INHALATION_SPRAY | Freq: Two times a day (BID) | RESPIRATORY_TRACT | 5 refills | Status: DC

## 2023-04-27 NOTE — Patient Instructions (Addendum)
Take Everyday:  Symbicort 160 mcg 2 puffs TWICE daily with a spacer and rinse mouth out after use Xyzal 5 mg in AM Singulair 10 mg in PM Dymista nasal spray 1 spray each nostril (point out towards ear) TWICE daily.   2. As needed:    A. Airsupra 2 puffs as needed for shortness of breath, constant cough, or wheeze. Maximum 12 puffs/day.   B. Pataday eye drops - 1 drop each eye daily as needed   Extras for today:  - 40 mg IM depomedrol - Augmentin 875 mg twice daily for 10 days -take with probiotics - start tomorrow prednisone 20 mg daily for 4 days  Others: Order new CPAP accessories-check Amazon or reach out to sleep medicine. Get a mask that better fits your face for work. You need repeat blood work to look at cell counts-will message your PCP. It looks like she has already ordered this. Reach out to see when she wants you to get these. Consider allergy injections-will teach your immune system to become tolerant of your allergies   Follow up: 6-8 weeks, sooner if needed Thank you so much for letting me partake in your care today.  Don't hesitate to reach out if you have any additional concerns!  Tonny Bollman, MD [facility]

## 2023-05-15 ENCOUNTER — Other Ambulatory Visit: Payer: Self-pay

## 2023-05-17 ENCOUNTER — Telehealth: Payer: Self-pay | Admitting: Internal Medicine

## 2023-05-17 NOTE — Telephone Encounter (Signed)
Patient dropped off FMLA paperwork and gave to Charlestine Massed gave paperwork to [place], forms were filled out and signed by Dr. Maurine Minister.I informed patient paperwork was ready and at the front desk for pick up.

## 2023-06-09 ENCOUNTER — Encounter: Payer: Self-pay | Admitting: Family Medicine

## 2023-06-09 ENCOUNTER — Ambulatory Visit: Payer: Managed Care, Other (non HMO) | Admitting: Family Medicine

## 2023-06-09 VITALS — BP 134/76 | HR 74 | Temp 99.0°F | Ht 74.0 in | Wt 235.5 lb

## 2023-06-09 DIAGNOSIS — S46811A Strain of other muscles, fascia and tendons at shoulder and upper arm level, right arm, initial encounter: Secondary | ICD-10-CM

## 2023-06-09 NOTE — Patient Instructions (Addendum)
Heat (pad or rice pillow in microwave) over affected area, 10-15 minutes twice daily.   Ice/cold pack over area for 10-15 min twice daily.  OK to take Tylenol 1000 mg (2 extra strength tabs) or 975 mg (3 regular strength tabs) every 6 hours as needed.  Please contact the GI team at: [phone]  Let us know if you need anything.  Trapezius stretches/exercises Do exercises exactly as told by your health care provider and adjust them as directed. It is normal to feel mild stretching, pulling, tightness, or discomfort as you do these exercises, but you should stop right away if you feel sudden pain or your pain gets worse.   Stretching and range of motion exercises These exercises warm up your muscles and joints and improve the movement and flexibility of your shoulder. These exercises can also help to relieve pain, numbness, and tingling. If you are unable to do any of the following for any reason, do not further attempt to do it.   Exercise A: Flexion, standing     Stand and hold a broomstick, a cane, or a similar object. Place your hands a little more than shoulder-width apart on the object. Your left / right hand should be palm-up, and your other hand should be palm-down. Push the stick to raise your left / right arm out to your side and then over your head. Use your other hand to help move the stick. Stop when you feel a stretch in your shoulder, or when you reach the angle that is recommended by your health care provider. Avoid shrugging your shoulder while you raise your arm. Keep your shoulder blade tucked down toward your spine. Hold for 30 seconds. Slowly return to the starting position. Repeat 2 times. Complete this exercise 3 times per week.  Exercise B: Abduction, supine     Lie on your back and hold a broomstick, a cane, or a similar object. Place your hands a little more than shoulder-width apart on the object. Your left / right hand should be palm-up, and your other hand  should be palm-down. Push the stick to raise your left / right arm out to your side and then over your head. Use your other hand to help move the stick. Stop when you feel a stretch in your shoulder, or when you reach the angle that is recommended by your health care provider. Avoid shrugging your shoulder while you raise your arm. Keep your shoulder blade tucked down toward your spine. Hold for 30 seconds. Slowly return to the starting position. Repeat 2 times. Complete this exercise 3 times per week.  Exercise C: Flexion, active-assisted     Lie on your back. You may bend your knees for comfort. Hold a broomstick, a cane, or a similar object. Place your hands about shoulder-width apart on the object. Your palms should face toward your feet. Raise the stick and move your arms over your head and behind your head, toward the floor. Use your healthy arm to help your left / right arm move farther. Stop when you feel a gentle stretch in your shoulder, or when you reach the angle where your health care provider tells you to stop. Hold for 30 seconds. Slowly return to the starting position. Repeat 2 times. Complete this exercise 3 times per week.  Exercise D: External rotation and abduction     Stand in a door frame with one of your feet slightly in front of the other. This is called a staggered stance.  Choose one of the following positions as told by your health care provider: Place your hands and forearms on the door frame above your head. Place your hands and forearms on the door frame at the height of your head. Place your hands on the door frame at the height of your elbows. Slowly move your weight onto your front foot until you feel a stretch across your chest and in the front of your shoulders. Keep your head and chest upright and keep your abdominal muscles tight. Hold for 30 seconds. To release the stretch, shift your weight to your back foot. Repeat 2 times. Complete this stretch 3  times per week.  Strengthening exercises These exercises build strength and endurance in your shoulder. Endurance is the ability to use your muscles for a long time, even after your muscles get tired. Exercise E: Scapular depression and adduction  Sit on a stable chair. Support your arms in front of you with pillows, armrests, or a tabletop. Keep your elbows in line with the sides of your body. Gently move your shoulder blades down toward your middle back. Relax the muscles on the tops of your shoulders and in the back of your neck. Hold for 3 seconds. Slowly release the tension and relax your muscles completely before doing this exercise again. Repeat for a total of 10 repetitions. After you have practiced this exercise, try doing the exercise without the arm support. Then, try the exercise while standing instead of sitting. Repeat 2 times. Complete this exercise 3 times per week.  Exercise F: Shoulder abduction, isometric     Stand or sit about 4-6 inches (10-15 cm) from a wall with your left / right side facing the wall. Bend your left / right elbow and gently press your elbow against the wall. Increase the pressure slowly until you are pressing as hard as you can without shrugging your shoulder. Hold for 3 seconds. Slowly release the tension and relax your muscles completely. Repeat for a total of 10 repetitions. Repeat 2 times. Complete this exercise 3 times per week.  Exercise G: Shoulder flexion, isometric     Stand or sit about 4-6 inches (10-15 cm) away from a wall with your left / right side facing the wall. Keep your left / right elbow straight and gently press the top of your fist against the wall. Increase the pressure slowly until you are pressing as hard as you can without shrugging your shoulder. Hold for 10-15 seconds. Slowly release the tension and relax your muscles completely. Repeat for a total of 10 repetitions. Repeat 2 times. Complete this exercise 3 times per  week.  Exercise H: Internal rotation     Sit in a stable chair without armrests, or stand. Secure an exercise band at your left / right side, at elbow height. Place a soft object, such as a folded towel or a small pillow, under your left / right upper arm so your elbow is a few inches (about 8 cm) away from your side. Hold the end of the exercise band so the band stretches. Keeping your elbow pressed against the soft object under your arm, move your forearm across your body toward your abdomen. Keep your body steady so the movement is only coming from your shoulder. Hold for 3 seconds. Slowly return to the starting position. Repeat for a total of 10 repetitions. Repeat 2 times. Complete this exercise 3 times per week.  Exercise I: External rotation     Sit in a  stable chair without armrests, or stand. Secure an exercise band at your left / right side, at elbow height. Place a soft object, such as a folded towel or a small pillow, under your left / right upper arm so your elbow is a few inches (about 8 cm) away from your side. Hold the end of the exercise band so the band stretches. Keeping your elbow pressed against the soft object under your arm, move your forearm out, away from your abdomen. Keep your body steady so the movement is only coming from your shoulder. Hold for 3 seconds. Slowly return to the starting position. Repeat for a total of 10 repetitions. Repeat 2 times. Complete this exercise 3 times per week. Exercise J: Shoulder extension  Sit in a stable chair without armrests, or stand. Secure an exercise band to a stable object in front of you so the band is at shoulder height. Hold one end of the exercise band in each hand. Your palms should face each other. Straighten your elbows and lift your hands up to shoulder height. Step back, away from the secured end of the exercise band, until the band stretches. Squeeze your shoulder blades together and pull your hands down to the  sides of your thighs. Stop when your hands are straight down by your sides. Do not let your hands go behind your body. Hold for 3 seconds. Slowly return to the starting position. Repeat for a total of 10 repetitions. Repeat 2 times. Complete this exercise 3 times per week.  Exercise K: Shoulder extension, prone     Lie on your abdomen on a firm surface so your left / right arm hangs over the edge. Hold a 5 lb weight in your hand so your palm faces in toward your body. Your arm should be straight. Squeeze your shoulder blade down toward the middle of your back. Slowly raise your arm behind you, up to the height of the surface that you are lying on. Keep your arm straight. Hold for 3 seconds. Slowly return to the starting position and relax your muscles. Repeat for a total of 10 repetitions. Repeat 2 times. Complete this exercise 3 times per week.   Exercise L: Horizontal abduction, prone  Lie on your abdomen on a firm surface so your left / right arm hangs over the edge. Hold a 5 lb weight in your hand so your palm faces toward your feet. Your arm should be straight. Squeeze your shoulder blade down toward the middle of your back. Bend your elbow so your hand moves up, until your elbow is bent to an "L" shape (90 degrees). With your elbow bent, slowly move your forearm forward and up. Raise your hand up to the height of the surface that you are lying on. Your upper arm should not move, and your elbow should stay bent. At the top of the movement, your palm should face the floor. Hold for 3 seconds. Slowly return to the starting position and relax your muscles. Repeat for a total of 10 repetitions. Repeat 2 times. Complete this exercise 3 times per week.  Exercise M: Horizontal abduction, standing  Sit on a stable chair, or stand. Secure an exercise band to a stable object in front of you so the band is at shoulder height. Hold one end of the exercise band in each hand. Straighten your  elbows and lift your hands straight in front of you, up to shoulder height. Your palms should face down, toward the floor. Step  back, away from the secured end of the exercise band, until the band stretches. Move your arms out to your sides, and keep your arms straight. Hold for 3 seconds. Slowly return to the starting position. Repeat for a total of 10 repetitions. Repeat 2 times. Complete this exercise 3 times per week.  Exercise N: Scapular retraction and elevation  Sit on a stable chair, or stand. Secure an exercise band to a stable object in front of you so the band is at shoulder height. Hold one end of the exercise band in each hand. Your palms should face each other. Sit in a stable chair without armrests, or stand. Step back, away from the secured end of the exercise band, until the band stretches. Squeeze your shoulder blades together and lift your hands over your head. Keep your elbows straight. Hold for 3 seconds. Slowly return to the starting position. Repeat for a total of 10 repetitions. Repeat 2 times. Complete this exercise 3 times per week.  This information is not intended to replace advice given to you by your health care provider. Make sure you discuss any questions you have with your health care provider. Document Released: 10/17/2005 Document Revised: 06/23/2016 Document Reviewed: 09/03/2015 Elsevier Interactive Patient Education  2017 [employer].

## 2023-06-09 NOTE — Progress Notes (Signed)
Musculoskeletal Exam  Patient: Allen Sawyer DOB: [birth year]  DOS: 06/09/2023  SUBJECTIVE:  Chief Complaint:   Chief Complaint  Patient presents with   Neck Pain    Allen Sawyer is a 62 y.o.  male for evaluation and treatment of neck pain.   Onset:  1 day ago. Fell asleep on a bad position Location: R lower neck Character:  aching  Progression of issue:  has improved Associated symptoms: pain w ROM No bruising,  Treatment: to date has been: [employer]  Neurovascular symptoms: no  Asthma-patient is under the care of the allergy team.  He is breathing much better.  He has been off of work for the past month.  He is interested in going back on Monday if possible.  He is requesting a letter.  Past Medical History:  Diagnosis Date   Sleep apnea     Objective: VITAL SIGNS: BP 134/76 (BP Location: Left Arm, Cuff Size: Normal)   Pulse 74   Temp 99 F (37.2 C) (Oral)   Ht 6\' 2"  (1.88 m)   Wt 235 lb 8 oz (106.8 kg)   SpO2 98%   BMI 30.24 kg/m  Constitutional: Well formed, well developed. No acute distress. Heart: RRR Thorax & Lungs: CTAB. No accessory muscle use Musculoskeletal: neck.   Normal active range of motion: yes.   Normal passive range of motion: yes Tenderness to palpation: mild ttp over the trap region on R Deformity: no Ecchymosis: no Tests positive: none Tests negative: Spurling's Neurologic: Normal sensory function. No focal deficits noted. DTR's equal and symmetric in UE's. No clonus. Psychiatric: Normal mood. Age appropriate judgment and insight. Alert & oriented x 3.    Assessment:  Strain of right trapezius muscle, initial encounter  Plan: Stretches/exercises, heat, ice, Tylenol.  Send message if no better. Letter allowing him to return to work on Monday provided. F/u as originally scheduled. The patient voiced understanding and agreement to the plan.   Jilda Roche [place], DO 06/09/23  11:51 AM

## 2023-07-07 ENCOUNTER — Ambulatory Visit: Payer: Managed Care, Other (non HMO) | Admitting: Family Medicine

## 2023-07-07 ENCOUNTER — Encounter: Payer: Self-pay | Admitting: Family Medicine

## 2023-07-07 VITALS — BP 132/82 | HR 66 | Temp 97.8°F | Ht 74.0 in | Wt 237.0 lb

## 2023-07-07 DIAGNOSIS — R7989 Other specified abnormal findings of blood chemistry: Secondary | ICD-10-CM | POA: Diagnosis not present

## 2023-07-07 DIAGNOSIS — J309 Allergic rhinitis, unspecified: Secondary | ICD-10-CM

## 2023-07-07 DIAGNOSIS — Z0000 Encounter for general adult medical examination without abnormal findings: Secondary | ICD-10-CM

## 2023-07-07 LAB — CBC WITH DIFFERENTIAL/PLATELET
Basophils Absolute: 0 10*3/uL (ref 0.0–0.1)
Basophils Relative: 1.2 % (ref 0.0–3.0)
Eosinophils Absolute: 0.1 10*3/uL (ref 0.0–0.7)
Eosinophils Relative: 3.2 % (ref 0.0–5.0)
HCT: 43 % (ref 39.0–52.0)
Hemoglobin: 14.2 g/dL (ref 13.0–17.0)
Lymphocytes Relative: 48.5 % — ABNORMAL HIGH (ref 12.0–46.0)
Lymphs Abs: 2.1 10*3/uL (ref 0.7–4.0)
MCHC: 33.1 g/dL (ref 30.0–36.0)
MCV: 100.8 fl — ABNORMAL HIGH (ref 78.0–100.0)
Monocytes Absolute: 0.4 10*3/uL (ref 0.1–1.0)
Monocytes Relative: 10.3 % (ref 3.0–12.0)
Neutro Abs: 1.6 10*3/uL (ref 1.4–7.7)
Neutrophils Relative %: 36.8 % — ABNORMAL LOW (ref 43.0–77.0)
Platelets: 190 10*3/uL (ref 150.0–400.0)
RBC: 4.27 Mil/uL (ref 4.22–5.81)
RDW: 13.6 % (ref 11.5–15.5)
WBC: 4.3 10*3/uL (ref 4.0–10.5)

## 2023-07-07 LAB — TESTOSTERONE: Testosterone: 338.67 ng/dL (ref 300.00–890.00)

## 2023-07-07 NOTE — Patient Instructions (Addendum)
Pick up your refills from the pharmacy and take all medications as prescribed. 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 You are due for regular follow-up with allergist - please schedule.

## 2023-07-07 NOTE — Progress Notes (Signed)
   Acute Office Visit  Subjective:     Patient ID: Allen Sawyer, male    DOB: Sep 01, 1961, 62 y.o.   MRN: [id number]  Chief Complaint  Patient presents with   Allergies    HPI Patient is in today for allergic rhinitis.   Discussed the use of AI scribe software for clinical note transcription with the patient, who gave verbal consent to proceed.  History of Present Illness   The patient, with a history of severe allergies and asthma, presents with a flare-up of allergy symptoms. They report running out of Singulair, which they had been taking in the evenings, and noticed a significant increase in symptoms within three days of discontinuing the medication. Symptoms include itchy eyes, headaches, and a sensation of discomfort in the chest, but no breathing issues. The patient denies any use of albuterol for symptom relief, indicating good control of their asthma with Symbicort, which they use BID as prescribed.   The patient's workplace is a known trigger for their allergies, but they report that wearing a mask has been helpful in managing symptoms. They also note that they have been using a nasal spray (Dymista) regularly, which provides immediate relief from symptoms.  The patient has a history of sweating and coughing up mucus, but these symptoms have resolved. They also report cessation of cigar use.         ROS All review of systems negative except what is listed in the HPI      Objective:    BP 132/82   Pulse 66   Temp 97.8 F (36.6 C) (Oral)   Ht 6\' 2"  (1.88 m)   Wt 237 lb (107.5 kg)   SpO2 97%   BMI 30.43 kg/m    Physical Exam Vitals reviewed.  Constitutional:      Appearance: Normal appearance.  HENT:     Nose: Rhinorrhea present.     Mouth/Throat:     Pharynx: No posterior oropharyngeal erythema.  Cardiovascular:     Rate and Rhythm: Normal rate and regular rhythm.     Pulses: Normal pulses.     Heart sounds: Normal heart sounds.  Pulmonary:     Effort:  Pulmonary effort is normal.     Breath sounds: Normal breath sounds.  Skin:    General: Skin is warm and dry.  Neurological:     Mental Status: He is alert and oriented to person, place, and time.  Psychiatric:        Mood and Affect: Mood normal.        Behavior: Behavior normal.        Thought Content: Thought content normal.        Judgment: Judgment normal.      No results found for any visits on 07/07/23.      Assessment & Plan:   Problem List Items Addressed This Visit   None Visit Diagnoses     Allergic rhinitis, unspecified seasonality, unspecified trigger    -  Primary     Pick up your refills from the pharmacy and take all medications as prescribed. 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 You are due for regular follow-up with allergist - please schedule.  No orders of the defined types were placed in this encounter.   Return if symptoms worsen or fail to improve.  Clayborne Dana, NP

## 2023-07-10 NOTE — Addendum Note (Signed)
Addended by: Hyman Hopes B on: 07/10/2023 08:19 AM   Modules accepted: Orders

## 2023-07-17 ENCOUNTER — Encounter: Payer: Self-pay | Admitting: Family

## 2023-07-17 ENCOUNTER — Ambulatory Visit (INDEPENDENT_AMBULATORY_CARE_PROVIDER_SITE_OTHER): Payer: Managed Care, Other (non HMO) | Admitting: Family

## 2023-07-17 ENCOUNTER — Other Ambulatory Visit: Payer: Self-pay

## 2023-07-17 VITALS — BP 128/72 | HR 69 | Temp 97.7°F | Resp 20 | Wt 242.2 lb

## 2023-07-17 DIAGNOSIS — H1013 Acute atopic conjunctivitis, bilateral: Secondary | ICD-10-CM | POA: Diagnosis not present

## 2023-07-17 DIAGNOSIS — J4530 Mild persistent asthma, uncomplicated: Secondary | ICD-10-CM | POA: Diagnosis not present

## 2023-07-17 DIAGNOSIS — J302 Other seasonal allergic rhinitis: Secondary | ICD-10-CM

## 2023-07-17 DIAGNOSIS — J3089 Other allergic rhinitis: Secondary | ICD-10-CM

## 2023-07-17 DIAGNOSIS — H1010 Acute atopic conjunctivitis, unspecified eye: Secondary | ICD-10-CM

## 2023-07-17 NOTE — Progress Notes (Signed)
[address] [place] [address] Dept: [phone]  FOLLOW UP NOTE  Patient ID: Allen Sawyer, male    DOB: Dec 25, 1960  Age: 62 y.o. MRN: [id number] Date of Office Visit: 07/17/2023  Assessment  Chief Complaint: Follow-up (Same day, Eye symptoms, ha, leg cramps for 1 week. )  HPI Allen Sawyer is a 62 year old male who presents today for an acute visit of sticky eyes, headache, weakness and leg cramps for 1 week.  He was last seen on April 27, 2023 by Dr. Maurine Minister.  He denies any new diagnosis or surgery since his last office visit.  He reports for the past week he has had watery eyes, itchy eyes, sticky eyes, headache, weakness, and leg cramps.  He feels like he may have flu.  He has not checked for COVID-19 or influenza.  He reports that he is able to taste and smell he denies fever, but reports for the past 2 weeks he has had chills every now and then.  He denies any drainage from his eyes, vision change, pain in his eyes, or double vision.  He reports that sometimes it will feel like something is in his eyes.  Allergic rhinitis: He reports that he does want to start immunotherapy.  Discussed the difference between [facility] immunotherapy and traditional immunotherapy.  He reports due to his busy schedule he would like to do traditional immunotherapy.  He reports clear rhinorrhea and occasional nasal congestion.  He also has a little bit of postnasal drip.  He has not been treated for any sinus infections since we last saw him.  He does report the Augmentin and prednisone he was given at the last office visit did help with his symptoms.  He does mention that at times his ears will feel clogged up and then he will yawn and his ears will pop.  He also at times will feel drainage in his ears.  He does continue to use Dymista 1 spray each nostril twice a day.  He is not currently taking Singulair 10 mg once a day and does not think that he is taking an antihistamine.  Allergic conjunctivitis: He reports  itchy watery eyes at times.  He reports that when his eyes are red he will use the Pataday eyedrops and they help.  His asthma is reported as "on point".  He is currently still taking Symbicort 160/4.5 mcg 2 puffs twice a day with a spacer and has Airsupra to use as needed.  He reports that he has not needed Airsupra, but keeps it on him at all times.  He is currently not taking Singulair 10 mg daily.  Discussed how Singulair helps with both asthma and allergies.  He denies cough, wheeze, tightness in chest, shortness of breath, and nocturnal awakenings due to breathing problems.  Since his last office visit he has not required any systemic steroids or made any trips to the emergency room or urgent care due to breathing problems.    Drug Allergies:  No Known Allergies  Review of Systems: Negative except as per HPI   Physical Exam: BP 128/72 (BP Location: Left Arm, Patient Position: Sitting, Cuff Size: Large)   Pulse 69   Temp 97.7 F (36.5 C) (Temporal)   Resp 20   Wt 242 lb 3.2 oz (109.9 kg)   SpO2 97%   BMI 31.10 kg/m    Physical Exam Constitutional:      Appearance: Normal appearance.  HENT:     Head: Normocephalic and  atraumatic.     Comments: Pharynx normal, eyes normal, ears normal, nose: Bilateral lower turbinates mildly edematous with no drainage noted    Right Ear: Tympanic membrane, ear canal and external ear normal.     Left Ear: Tympanic membrane, ear canal and external ear normal.     Mouth/Throat:     Mouth: Mucous membranes are moist.     Pharynx: Oropharynx is clear.  Eyes:     Conjunctiva/sclera: Conjunctivae normal.  Cardiovascular:     Rate and Rhythm: Regular rhythm.     Heart sounds: Normal heart sounds.  Pulmonary:     Effort: Pulmonary effort is normal.     Breath sounds: Normal breath sounds.     Comments: Lungs clear to auscultation Musculoskeletal:     Cervical back: Neck supple.  Skin:    General: Skin is warm.  Neurological:     Mental  Status: He is alert and oriented to person, place, and time.  Psychiatric:        Mood and Affect: Mood normal.        Behavior: Behavior normal.        Thought Content: Thought content normal.        Judgment: Judgment normal.     Diagnostics:  None  Assessment and Plan: 1. Seasonal and perennial allergic rhinitis   2. Seasonal allergic conjunctivitis   3. Mild persistent asthma without complication     No orders of the defined types were placed in this encounter.   Patient Instructions  Take Everyday:  Symbicort 160 mcg 2 puffs TWICE daily with a spacer and rinse mouth out after use Re-start Xyzal (levocetirizine) 5 mg in AM Re-start Singulair (montelukast) 10 mg in PM. Patient cautioned that rarely some children/adults can experience behavioral changes after beginning montelukast. These side effects are rare, however, if you notice any change, notify the clinic and discontinue montelukast. Dymista nasal spray 1 spray each nostril (point out towards ear) TWICE daily.   2. As needed:    A. Airsupra 2 puffs as needed for shortness of breath, constant cough, or wheeze. Maximum 12 puffs/day.   B. Pataday eye drops - 1 drop each eye daily as needed    Others: CPT codes given to call insurance to find out cost of traditional allergy injections. After finding out the cost please call our office to set up an appointment to start allergy injections  Your symptoms do not sound like Covid-19 or influenza.  Your symptoms are likely viral. Verbally instructed him to let us know if he is not getting any  better.  Schedule an appointment with your primary care physician to discuss muscle weakness and leg cramps. These symptoms are not due to your allergies.   Follow up: 3 months, sooner if needed   Return in about 3 months (around 10/16/2023), or if symptoms worsen or fail to improve.    Thank you for the opportunity to care for this patient.  Please do not hesitate to contact me  with questions.  Nehemiah Settle, FNP [facility]

## 2023-07-17 NOTE — Patient Instructions (Addendum)
Take Everyday:  Symbicort 160 mcg 2 puffs TWICE daily with a spacer and rinse mouth out after use Re-start Xyzal (levocetirizine) 5 mg in AM Re-start Singulair (montelukast) 10 mg in PM. Patient cautioned that rarely some children/adults can experience behavioral changes after beginning montelukast. These side effects are rare, however, if you notice any change, notify the clinic and discontinue montelukast. Dymista nasal spray 1 spray each nostril (point out towards ear) TWICE daily.   2. As needed:    A. Airsupra 2 puffs as needed for shortness of breath, constant cough, or wheeze. Maximum 12 puffs/day.   B. Pataday eye drops - 1 drop each eye daily as needed    Others: CPT codes given to call insurance to find out cost of traditional allergy injections. After finding out the cost please call our office to set up an appointment to start allergy injections  Your symptoms do not sound like Covid-19 or influenza.  Your symptoms are likely viral. Verbally instructed him to let us know if he is not getting any  better.  Schedule an appointment with your primary care physician to discuss muscle weakness and leg cramps. These symptoms are not due to your allergies.   Follow up: 3 months, sooner if needed

## 2023-07-19 ENCOUNTER — Encounter: Payer: Self-pay | Admitting: Family

## 2023-07-19 ENCOUNTER — Encounter: Payer: Self-pay | Admitting: Family Medicine

## 2023-07-19 ENCOUNTER — Ambulatory Visit (INDEPENDENT_AMBULATORY_CARE_PROVIDER_SITE_OTHER): Payer: Managed Care, Other (non HMO) | Admitting: Family Medicine

## 2023-07-19 VITALS — BP 143/84 | HR 66 | Temp 98.1°F | Ht 74.0 in | Wt 237.0 lb

## 2023-07-19 DIAGNOSIS — J069 Acute upper respiratory infection, unspecified: Secondary | ICD-10-CM | POA: Diagnosis not present

## 2023-07-19 NOTE — Patient Instructions (Signed)
The following information is provided as a Counsellor for ADULT patients only and does NOT take into account PREGNANCY, ALLERGIES, LIVER CONDITIONS, KIDNEY CONDITIONS, GASTROINTESTINAL CONDITIONS, OR PRESCRIPTION MEDICATION INTERACTIONS. Please be sure to ask your provider if the following are safe to take with your specific medical history, conditions, or current medication regimen if you are unsure.   Adult Basic Symptom Management  Congestion: Guaifenesin (Mucinex)- follow directions on packaging with a maximum dose of 2400mg  in a 24 hour period.  Pain/Fever: Ibuprofen 200mg  - 400mg  every 4-6 hours as needed (MAX 1200mg  in a 24 hour period) Pain/Fever: Tylenol 500mg  -1000mg  every 6-8 hours as needed (MAX 3000mg  in a 24 hour period)  Cough: Dextromethorphan (Delsym)- follow directions on packing with a maximum dose of 120mg  in a 24 hour period.  Nasal Stuffiness: Saline nasal spray and/or Nettie Pot with sterile saline solution  Runny Nose: Fluticasone nasal spray (Flonase) OR Mometasone nasal spray (Nasonex) OR Triamcinolone Acetonide nasal spray (Nasacort)- follow directions on the packaging  Pain/Pressure: Warm washcloth to the face  Sore Throat: Warm salt water gargles  If you have allergies, you may also consider taking an oral antihistamine (like Zyrtec or Claritin) as these may also help with your symptoms.  **Many medications will have more than one ingredient, be sure you are reading the packaging carefully and not taking more than one dose of the same kind of medication at the same time or too close together. It is OK to use formulas that have all of the ingredients you want, but do not take them in a combined medication and as separate dose too close together. If you have any questions, the pharmacist will be happy to help you decide what is safe.

## 2023-07-19 NOTE — Progress Notes (Signed)
Acute Office Visit  Subjective:     Patient ID: Allen Sawyer, male    DOB: 07-Sep-1961, 62 y.o.   MRN: [id number]  Chief Complaint  Patient presents with   Weakness     Patient is in today for URI.   Discussed the use of AI scribe software for clinical note transcription with the patient, who gave verbal consent to proceed.  History of Present Illness   The patient, with a history of allergies, presented with 5-day illness that began with severe headaches, leg weakness, and sweating. The symptoms started last Thursday and were most severe for the first three days. By Sunday, the patient was able to work, but still felt sluggish. The headaches have since subsided but not completely resolved. The patient also reported experiencing diarrhea, which has since resolved.  The patient was seen by an allergist who suggested that the symptoms might be due to a viral infection, as a coworker in the same department had similar symptoms. The patient also reported waking up with sticky eyes, which was attributed to allergies.  In addition to these symptoms, the patient reported a transient sore throat last week, which has since resolved. The patient also noted an increase in appetite recently.  The patient has been managing his allergies with Symbicort, Xyzal, Singulair, nasal sprays, inhalers, and eye drops. The patient reported no need for rescue inhalers, indicating no current breathing issues. Reports he is feeling mostly back to normal now.         All review of systems negative except what is listed in the HPI      Objective:    BP (!) 143/84   Pulse 66   Temp 98.1 F (36.7 C) (Oral)   Ht 6\' 2"  (1.88 m)   Wt 237 lb (107.5 kg)   SpO2 98%   BMI 30.43 kg/m    Physical Exam Vitals reviewed.  Constitutional:      Appearance: Normal appearance.  HENT:     Head: Normocephalic and atraumatic.     Right Ear: Tympanic membrane normal.     Left Ear: Tympanic membrane normal.      Nose: Congestion present.     Mouth/Throat:     Pharynx: No posterior oropharyngeal erythema.  Eyes:     Extraocular Movements: Extraocular movements intact.     Conjunctiva/sclera: Conjunctivae normal.  Cardiovascular:     Rate and Rhythm: Normal rate and regular rhythm.     Heart sounds: Normal heart sounds.  Pulmonary:     Effort: Pulmonary effort is normal.     Breath sounds: Normal breath sounds.  Musculoskeletal:     Cervical back: Normal range of motion and neck supple. No tenderness.  Lymphadenopathy:     Cervical: No cervical adenopathy.  Skin:    General: Skin is warm and dry.  Neurological:     Mental Status: He is alert and oriented to person, place, and time.  Psychiatric:        Mood and Affect: Mood normal.        Behavior: Behavior normal.        Thought Content: Thought content normal.        Judgment: Judgment normal.     No results found for any visits on 07/19/23.      Assessment & Plan:   Problem List Items Addressed This Visit   None Visit Diagnoses     Viral URI    -  Primary  Viral Syndrome Recent onset of headaches, weakness, and sweating, improved over the past few days. Likely viral etiology given similar symptoms in a coworker and the self-limited course. No current indication for antibiotics. -Continue symptomatic management. -Flush nasal passages with saline multiple times a day.   Hypertension Blood pressure elevated at today's visit (142/78), but patient reports recent normal readings. Possible transient elevation due to recent illness or pain. -Schedule nurse visit in 2-4 weeks to recheck blood pressure.      No orders of the defined types were placed in this encounter.   Return in about 2 weeks (around 08/02/2023) for BP check with nurse.  Clayborne Dana, NP

## 2023-08-08 ENCOUNTER — Other Ambulatory Visit: Payer: Self-pay | Admitting: Internal Medicine

## 2023-08-09 ENCOUNTER — Other Ambulatory Visit: Payer: Managed Care, Other (non HMO)

## 2023-09-01 ENCOUNTER — Encounter: Payer: Self-pay | Admitting: Family Medicine

## 2023-09-01 ENCOUNTER — Ambulatory Visit: Payer: Managed Care, Other (non HMO) | Admitting: Family Medicine

## 2023-09-01 VITALS — BP 149/87 | HR 66 | Temp 98.1°F | Ht 74.0 in | Wt 233.0 lb

## 2023-09-01 DIAGNOSIS — J302 Other seasonal allergic rhinitis: Secondary | ICD-10-CM | POA: Diagnosis not present

## 2023-09-01 DIAGNOSIS — J4530 Mild persistent asthma, uncomplicated: Secondary | ICD-10-CM

## 2023-09-01 MED ORDER — MONTELUKAST SODIUM 10 MG PO TABS
10.0000 mg | ORAL_TABLET | Freq: Every day | ORAL | 3 refills | Status: DC
Start: 2023-09-01 — End: 2024-08-05

## 2023-09-01 MED ORDER — LEVOCETIRIZINE DIHYDROCHLORIDE 5 MG PO TABS: 5.0000 mg | ORAL_TABLET | Freq: Every evening | ORAL | 3 refills | Status: DC

## 2023-09-01 NOTE — Progress Notes (Signed)
Acute Office Visit  Subjective:     Patient ID: Allen Sawyer, male    DOB: 28-Nov-1960, 62 y.o.   MRN: [id number]  Chief Complaint  Patient presents with   Allergies    HPI Patient is in today for allergy flare.  Discussed the use of AI scribe software for clinical note transcription with the patient, who gave verbal consent to proceed.  History of Present Illness   The patient, with a history of allergies and respiratory issues, reports a recent exacerbation of symptoms after running out of their regular allergy medication and switching to Claritin D. They describe a sensation of itching in their lung, which subsequently developed into wheezing and sinus discomfort. The patient also reports an itchy throat and general malaise, which has disrupted their sleep and elevated their blood pressure.  Despite these issues, the patient has been attempting to maintain their work schedule, although they acknowledge the potential need for a change in their work environment due to the recurrent exacerbations of their symptoms.  The patient has been managing their symptoms with a regimen of inhalers and nasal sprays, which they report as being generally effective. However, they have had to use their rescue inhaler on one occasion when they experienced a sensation of difficulty breathing. The patient is also on Symbicort, which they take twice daily, and Singulair, which they had run out of.           ROS All review of systems negative except what is listed in the HPI      Objective:    BP (!) 149/87   Pulse 66   Temp 98.1 F (36.7 C) (Oral)   Ht 6\' 2"  (1.88 m)   Wt 233 lb (105.7 kg)   SpO2 98%   BMI 29.92 kg/m    Physical Exam Vitals reviewed.  Constitutional:      Appearance: Normal appearance.  HENT:     Head: Normocephalic and atraumatic.     Right Ear: Tympanic membrane normal.     Left Ear: Tympanic membrane normal.     Nose: Congestion present.     Mouth/Throat:      Pharynx: No posterior oropharyngeal erythema.  Eyes:     Extraocular Movements: Extraocular movements intact.     Conjunctiva/sclera: Conjunctivae normal.  Cardiovascular:     Rate and Rhythm: Normal rate and regular rhythm.     Heart sounds: Normal heart sounds.  Pulmonary:     Effort: Pulmonary effort is normal.     Breath sounds: Normal breath sounds.  Musculoskeletal:     Cervical back: Normal range of motion and neck supple. No tenderness.  Lymphadenopathy:     Cervical: No cervical adenopathy.  Skin:    General: Skin is warm and dry.  Neurological:     Mental Status: He is alert and oriented to person, place, and time.  Psychiatric:        Mood and Affect: Mood normal.        Behavior: Behavior normal.        Thought Content: Thought content normal.        Judgment: Judgment normal.        No results found for any visits on 09/01/23.      Assessment & Plan:   Problem List Items Addressed This Visit       Active Problems   Seasonal allergies   Relevant Medications   levocetirizine (XYZAL) 5 MG tablet   montelukast (SINGULAIR) 10 MG tablet  Mild persistent asthma without complication - Primary   Relevant Medications   montelukast (SINGULAIR) 10 MG tablet       Allergic Rhinitis Increased symptoms after discontinuing Singulair and Xyzal, with subsequent development of wheezing, sinus congestion, and pruritus. Stable exam today. -Resume Singulair and Xyzal with refills for a year to prevent running out. -Continue daily use of nasal spray. -Ensure follow-up with allergist.  Asthma Experiencing shortness of breath and wheezing, relieved by Albuterol. No wheezing on exam. -Continue daily use of Symbicort twice a day. -Use Albuterol as a rescue inhaler as needed. 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 -Discuss potential job role change with employer to reduce exposure to allergens.  Hypertension -Schedule a nurse visit in two weeks to monitor blood pressure.          Meds ordered this encounter  Medications   levocetirizine (XYZAL) 5 MG tablet    Sig: Take 1 tablet (5 mg total) by mouth every evening.    Dispense:  90 tablet    Refill:  3    Order Specific Question:   Supervising Provider    Answer:   Danise Edge A [[id number]]   montelukast (SINGULAIR) 10 MG tablet    Sig: Take 1 tablet (10 mg total) by mouth at bedtime.    Dispense:  90 tablet    Refill:  3    Order Specific Question:   Supervising Provider    Answer:   Danise Edge A [[id number]]    Return in about 2 weeks (around 09/15/2023) for BP check with nurse.  Clayborne Dana, NP

## 2023-10-30 ENCOUNTER — Ambulatory Visit: Payer: Managed Care, Other (non HMO) | Admitting: Family Medicine

## 2023-10-30 ENCOUNTER — Encounter: Payer: Self-pay | Admitting: Family Medicine

## 2023-10-30 VITALS — BP 136/84 | HR 83 | Temp 98.0°F | Resp 16 | Ht 74.0 in | Wt 233.0 lb

## 2023-10-30 DIAGNOSIS — J4531 Mild persistent asthma with (acute) exacerbation: Secondary | ICD-10-CM | POA: Diagnosis not present

## 2023-10-30 MED ORDER — PREDNISONE 20 MG PO TABS
40.0000 mg | ORAL_TABLET | Freq: Every day | ORAL | 0 refills | Status: AC
Start: 2023-10-30 — End: 2023-11-04

## 2023-10-30 MED ORDER — AZITHROMYCIN 250 MG PO TABS: ORAL_TABLET | ORAL | 0 refills | Status: DC

## 2023-10-30 NOTE — Progress Notes (Signed)
Chief Complaint  Patient presents with   Nasal Congestion    Nasal Congestion    Allen Sawyer here for URI complaints.  Duration:  2.5  weeks  Associated symptoms: sinus headache, sinus congestion, rhinorrhea, myalgias, wheezing, and coughing, fatigue Denies: sinus pain, itchy watery eyes, ear pain, ear drainage, sore throat, shortness of breath, and fevers Treatment to date: INCS Sick contacts: No  Past Medical History:  Diagnosis Date   Sleep apnea     Objective BP 136/84   Pulse 83   Temp 98 F (36.7 C) (Oral)   Resp 16   Ht 6\' 2"  (1.88 m)   Wt 233 lb (105.7 kg)   SpO2 96%   BMI 29.92 kg/m  General: Awake, alert, appears stated age HEENT: AT, [place], ears patent b/l and TM's neg, nares patent w/o discharge, pharynx pink and without exudates, MMM Neck: No masses or asymmetry Heart: RRR Lungs: CTAB, no accessory muscle use Psych: Age appropriate judgment and insight, normal mood and affect  Mild persistent asthma with exacerbation - Plan: predniSONE (DELTASONE) 20 MG tablet  5 d pred burst 40 mg/d. If no improvement in Continue to push fluids, practice good hand hygiene, cover mouth when coughing. F/u prn. If starting to experience fevers, shaking, or shortness of breath, seek immediate care. Pt voiced understanding and agreement to the plan.  Jilda Roche [place], DO 10/30/23 4:26 PM

## 2023-10-30 NOTE — Patient Instructions (Addendum)
Continue to push fluids, practice good hand hygiene, and cover your mouth if you cough.  If you start having fevers, shaking or shortness of breath, seek immediate care.  Start with the prednisone. If no better in 2 days, take the antibiotic azithromycin.   Please reach out to Dr. Durene Fruits office: [phone]  Let us know if you need anything.

## 2023-12-01 ENCOUNTER — Ambulatory Visit: Payer: Managed Care, Other (non HMO) | Admitting: Family Medicine

## 2023-12-01 ENCOUNTER — Encounter: Payer: Self-pay | Admitting: Family Medicine

## 2023-12-01 VITALS — BP 136/78 | HR 78 | Temp 98.0°F | Resp 16 | Ht 74.0 in | Wt 238.0 lb

## 2023-12-01 DIAGNOSIS — G4733 Obstructive sleep apnea (adult) (pediatric): Secondary | ICD-10-CM | POA: Insufficient documentation

## 2023-12-01 DIAGNOSIS — R0683 Snoring: Secondary | ICD-10-CM

## 2023-12-01 MED ORDER — AZELASTINE-FLUTICASONE 137-50 MCG/ACT NA SUSP: 1.0000 | Freq: Two times a day (BID) | NASAL | 5 refills | Status: AC | PRN

## 2023-12-01 NOTE — Progress Notes (Signed)
Chief Complaint  Patient presents with   Referral    Discuss  referral     Subjective: Patient is a 63 y.o. male here for possible referral.  Hx of OSA on CPAP. Requesting referral to [facility] pulmonology.  His last sleep study was in 2021 with the [facility] medical team.  He does have a family history of sleep apnea.  He snores loudly without his CPAP.  Past Medical History:  Diagnosis Date   Sleep apnea     Objective: BP 136/78   Pulse 78   Temp 98 F (36.7 C) (Oral)   Resp 16   Ht 6\' 2"  (1.88 m)   Wt 238 lb (108 kg)   SpO2 96%   BMI 30.56 kg/m  General: Awake, appears stated age Heart: RRR, no LE edema Mouth: Mallampati 4, MMM Lungs: CTAB, no rales, wheezes or rhonchi. No accessory muscle use Psych: Age appropriate judgment and insight, normal affect and mood  Assessment and Plan: OSA on CPAP - Plan: Ambulatory referral to Pulmonology  Snoring - Plan: Ambulatory referral to Pulmonology  Referral to [facility] pulmonology/sleep team placed today.  Elevate head of bed if necessary.  Follow-up as originally scheduled. The patient voiced understanding and agreement to the plan.  Jilda Roche [place], DO 12/01/23  2:20 PM

## 2023-12-01 NOTE — Patient Instructions (Addendum)
 If you do not hear anything about your referral in the next 1-2 weeks, call our office and ask for an update.  Let us know if you need anything.

## 2023-12-08 ENCOUNTER — Other Ambulatory Visit (HOSPITAL_BASED_OUTPATIENT_CLINIC_OR_DEPARTMENT_OTHER): Payer: Self-pay

## 2023-12-08 ENCOUNTER — Emergency Department (HOSPITAL_BASED_OUTPATIENT_CLINIC_OR_DEPARTMENT_OTHER): Payer: Managed Care, Other (non HMO)

## 2023-12-08 ENCOUNTER — Other Ambulatory Visit: Payer: Self-pay

## 2023-12-08 ENCOUNTER — Emergency Department (HOSPITAL_BASED_OUTPATIENT_CLINIC_OR_DEPARTMENT_OTHER)
Admission: EM | Admit: 2023-12-08 | Discharge: 2023-12-08 | Disposition: A | Discharge status: Home / Self Care | Payer: Managed Care, Other (non HMO) | Attending: Emergency Medicine | Admitting: Emergency Medicine

## 2023-12-08 ENCOUNTER — Encounter (HOSPITAL_BASED_OUTPATIENT_CLINIC_OR_DEPARTMENT_OTHER): Payer: Self-pay | Admitting: Emergency Medicine

## 2023-12-08 DIAGNOSIS — Z20822 Contact with and (suspected) exposure to covid-19: Secondary | ICD-10-CM | POA: Insufficient documentation

## 2023-12-08 DIAGNOSIS — Z720 Tobacco use: Secondary | ICD-10-CM | POA: Diagnosis not present

## 2023-12-08 DIAGNOSIS — J101 Influenza due to other identified influenza virus with other respiratory manifestations: Secondary | ICD-10-CM | POA: Diagnosis not present

## 2023-12-08 DIAGNOSIS — J45909 Unspecified asthma, uncomplicated: Secondary | ICD-10-CM | POA: Diagnosis not present

## 2023-12-08 DIAGNOSIS — Z7951 Long term (current) use of inhaled steroids: Secondary | ICD-10-CM | POA: Diagnosis not present

## 2023-12-08 DIAGNOSIS — J181 Lobar pneumonia, unspecified organism: Secondary | ICD-10-CM | POA: Insufficient documentation

## 2023-12-08 DIAGNOSIS — J189 Pneumonia, unspecified organism: Secondary | ICD-10-CM

## 2023-12-08 DIAGNOSIS — R509 Fever, unspecified: Secondary | ICD-10-CM | POA: Diagnosis present

## 2023-12-08 HISTORY — DX: Unspecified asthma, uncomplicated: J45.909

## 2023-12-08 LAB — RESP PANEL BY RT-PCR (RSV, FLU A&B, COVID)  RVPGX2
Influenza A by PCR: POSITIVE — AB
Influenza B by PCR: NEGATIVE
Resp Syncytial Virus by PCR: NEGATIVE
SARS Coronavirus 2 by RT PCR: NEGATIVE

## 2023-12-08 MED ORDER — AMOXICILLIN 500 MG PO CAPS
500.0000 mg | ORAL_CAPSULE | Freq: Three times a day (TID) | ORAL | 0 refills | Status: DC
  Filled 2023-12-08: qty 21, 7d supply, fill #0

## 2023-12-08 MED ORDER — ALBUTEROL SULFATE HFA 108 (90 BASE) MCG/ACT IN AERS
2.0000 | INHALATION_SPRAY | RESPIRATORY_TRACT | Status: DC | PRN
Start: 2023-12-08 — End: 2023-12-08

## 2023-12-08 MED ORDER — DOXYCYCLINE HYCLATE 100 MG PO CAPS
100.0000 mg | ORAL_CAPSULE | Freq: Two times a day (BID) | ORAL | 0 refills | Status: DC
  Filled 2023-12-08: qty 20, 10d supply, fill #0

## 2023-12-08 MED ORDER — ALBUTEROL SULFATE HFA 108 (90 BASE) MCG/ACT IN AERS: 2.0000 | INHALATION_SPRAY | RESPIRATORY_TRACT | Status: DC | PRN

## 2023-12-08 MED ORDER — BENZONATATE 100 MG PO CAPS
100.0000 mg | ORAL_CAPSULE | Freq: Three times a day (TID) | ORAL | 0 refills | Status: DC
  Filled 2023-12-08: qty 21, 7d supply, fill #0

## 2023-12-08 MED ORDER — IPRATROPIUM-ALBUTEROL 0.5-2.5 (3) MG/3ML IN SOLN
3.0000 mL | Freq: Once | RESPIRATORY_TRACT | Status: AC
  Administered 2023-12-08: 3 mL via RESPIRATORY_TRACT
  Filled 2023-12-08: qty 3

## 2023-12-08 NOTE — ED Provider Notes (Signed)
 [place] [facility] Provider Note   CSN: [id number] Arrival date & time: 12/08/23  9077     History  Chief Complaint  Patient presents with   URI    Allen Sawyer is a 63 y.o. male.  The history is provided by the patient and medical records. No language interpreter was used.  URI    63 year old male history of asthma, obstructive sleep apnea on CPAP, tobacco use presenting with cold symptoms.  Patient report for nearly 2 weeks he has had fever, chills, body aches, congestion, productive cough, shortness of breath, wheezing and overall not feeling well.  He was seen by his PCP fairly recently and was given steroids which did help however within the past few days he has more sinus congestion and drainage with increased trouble breathing prompting this ER visit.  He denies active tobacco use, no prior history of PE DVT.  Home Medications Prior to Admission medications   Medication Sig Start Date End Date Taking? Authorizing Provider  Albuterol -Budesonide  (AIRSUPRA ) 90-80 MCG/ACT AERO Inhale 2 puffs into the lungs every 4 (four) hours as needed. 04/27/23   Marinda Rocky SAILOR, MD  Azelastine -Fluticasone  (DYMISTA ) 137-50 MCG/ACT SUSP Place 1 spray into both nostrils 2 (two) times daily as needed. 12/01/23   Frann Mabel Mt, DO  budesonide -formoterol  (SYMBICORT ) 160-4.5 MCG/ACT inhaler Inhale 2 puffs into the lungs 2 (two) times daily. 04/27/23   Marinda Rocky SAILOR, MD  hydrOXYzine  (VISTARIL ) 25 MG capsule Take 1 capsule (25 mg total) by mouth every 8 (eight) hours as needed for anxiety. 03/10/23   Almarie Waddell NOVAK, NP  levocetirizine (XYZAL ) 5 MG tablet Take 1 tablet (5 mg total) by mouth every evening. 09/01/23   Almarie Waddell NOVAK, NP  montelukast  (SINGULAIR ) 10 MG tablet Take 1 tablet (10 mg total) by mouth at bedtime. 09/01/23   Almarie Waddell NOVAK, NP  Olopatadine  HCl 0.2 % SOLN Apply 1 drop to eye as needed. 04/27/23   Marinda Rocky SAILOR, MD      Allergies     Patient has no known allergies.    Review of Systems   Review of Systems  All other systems reviewed and are negative.   Physical Exam Updated Vital Signs BP (!) 156/94 (BP Location: Left Arm)   Pulse 79   Temp 98 F (36.7 C) (Oral)   Resp 19   Ht 6' 2 (1.88 m)   Wt 108 kg   SpO2 97%   BMI 30.56 kg/m  Physical Exam Vitals and nursing note reviewed.  Constitutional:      General: He is not in acute distress.    Appearance: He is well-developed.     Comments: Overall well-appearing resting comfortably in bed appears to be in no acute discomfort.  Able to speak in complete sentences.  HENT:     Head: Atraumatic.  Eyes:     Conjunctiva/sclera: Conjunctivae normal.  Cardiovascular:     Rate and Rhythm: Normal rate and regular rhythm.  Pulmonary:     Breath sounds: Wheezing (Mild scattered wheezes heard no rales or rhonchi) present.  Musculoskeletal:     Cervical back: Neck supple.  Skin:    Findings: No rash.  Neurological:     Mental Status: He is alert.     ED Results / Procedures / Treatments   Labs (all labs ordered are listed, but only abnormal results are displayed) Labs Reviewed  RESP PANEL BY RT-PCR (RSV, FLU A&B, COVID)  RVPGX2 - Abnormal;  Notable for the following components:      Result Value   Influenza A by PCR POSITIVE (*)    All other components within normal limits    EKG None  Radiology DG Chest 2 View Result Date: 12/08/2023 CLINICAL DATA:  Congestion, cough, and fever EXAM: CHEST - 2 VIEW COMPARISON:  Chest radiograph dated 12/27/2022 FINDINGS: Normal lung volumes. Patchy lingular opacity. No pleural effusion or pneumothorax. The heart size and mediastinal contours are within normal limits. No acute osseous abnormality. IMPRESSION: Patchy lingular opacity, which may represent atelectasis or pneumonia. Electronically Signed   By: Limin  Xu M.D.   On: 12/08/2023 11:08    Procedures Procedures    Medications Ordered in ED Medications   ipratropium-albuterol  (DUONEB) 0.5-2.5 (3) MG/3ML nebulizer solution 3 mL (3 mLs Nebulization Given 12/08/23 1010)    ED Course/ Medical Decision Making/ A&P                                 Medical Decision Making Amount and/or Complexity of Data Reviewed Radiology: ordered.  Risk Prescription drug management.   BP (!) 156/94 (BP Location: Left Arm)   Pulse 79   Temp 98 F (36.7 C) (Oral)   Resp 19   Ht 6' 2 (1.88 m)   Wt 108 kg   SpO2 98%   BMI 30.56 kg/m   92:22 AM  63 year old male history of asthma, obstructive sleep apnea on CPAP, tobacco use presenting with cold symptoms.  Patient report for nearly 2 weeks he has had fever, chills, body aches, congestion, productive cough, shortness of breath, wheezing and overall not feeling well.  He was seen by his PCP fairly recently and was given steroids which did help however within the past few days he has more sinus congestion and drainage with increased trouble breathing prompting this ER visit.  He denies active tobacco use, no prior history of PE DVT.  On exam, patient is resting comfortably in no acute discomfort.  He does have some scattered wheezes on exam.  He is able to speak in complete sentences.  Vital sign overall reassuring no fever no hypoxia.  -Labs ordered, independently viewed and interpreted by me.  Labs remarkable for influenza A positive -The patient was maintained on a cardiac monitor.  I personally viewed and interpreted the cardiac monitored which showed an underlying rhythm of: NSR -Imaging independently viewed and interpreted by me and I agree with radiologist's interpretation.  Result remarkable for CXR showing pneumonia to lingular region -This patient presents to the ED for concern of cold sxs, this involves an extensive number of treatment options, and is a complaint that carries with it a high risk of complications and morbidity.  The differential diagnosis includes covid, flu, rsv, pneumonia, viral  illness, asthma exacerbation -Co morbidities that complicate the patient evaluation includes OSA, asthma, tobacco use -Treatment includes duonebs -Reevaluation of the patient after these medicines showed that the patient improved -PCP office notes or outside notes reviewed -Escalation to admission/observation considered: patients feels much better, is comfortable with discharge, and will follow up with PCP -Prescription medication considered, patient comfortable with amox and doxy, along with tessalon  and albuterol  inhaler -Social Determinant of Health considered which includes tobacco use         Final Clinical Impression(s) / ED Diagnoses Final diagnoses:  Lingular pneumonia  Influenza A    Rx / DC Orders ED Discharge Orders  Ordered    amoxicillin  (AMOXIL ) 500 MG capsule  3 times daily        12/08/23 1124    doxycycline  (VIBRAMYCIN ) 100 MG capsule  2 times daily        12/08/23 1124    benzonatate  (TESSALON ) 100 MG capsule  Every 8 hours        12/08/23 1124              Nivia Colon, PA-C 12/08/23 1126    Franklyn Sid SAILOR, MD 12/08/23 1216

## 2023-12-08 NOTE — ED Notes (Signed)

## 2023-12-08 NOTE — ED Triage Notes (Signed)
 Nasal congestion, productive cough, clear phlegm for over one week.  Pt admits to fever.  Pt was seen by pcp and was given steriods.

## 2023-12-08 NOTE — Discharge Instructions (Signed)
 You have been diagnosed with influenza A as well as pneumonia.  Please take antibiotics as prescribed.  Take Tessalon  as needed for cough.  Use albuterol  inhaler 2 puffs every 4 hours as needed for shortness of breath.  Return if your symptoms worsen.

## 2023-12-08 NOTE — ED Notes (Signed)
 Pt advised he's been sick since Monday, sudden onset. Mostly over it but has residual wheezing and cough. Productive at times, white mucuous. Takes inhaler 1x per month usually, for asthma, but had to use it more frequently in last week. 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

## 2024-02-06 ENCOUNTER — Other Ambulatory Visit: Payer: Self-pay

## 2024-02-06 ENCOUNTER — Emergency Department (HOSPITAL_BASED_OUTPATIENT_CLINIC_OR_DEPARTMENT_OTHER)
Admission: EM | Admit: 2024-02-06 | Discharge: 2024-02-06 | Disposition: A | Discharge status: Home / Self Care | Attending: Emergency Medicine | Admitting: Emergency Medicine

## 2024-02-06 ENCOUNTER — Encounter (HOSPITAL_BASED_OUTPATIENT_CLINIC_OR_DEPARTMENT_OTHER): Payer: Self-pay | Admitting: Emergency Medicine

## 2024-02-06 DIAGNOSIS — E860 Dehydration: Secondary | ICD-10-CM | POA: Insufficient documentation

## 2024-02-06 DIAGNOSIS — F17290 Nicotine dependence, other tobacco product, uncomplicated: Secondary | ICD-10-CM | POA: Insufficient documentation

## 2024-02-06 DIAGNOSIS — R197 Diarrhea, unspecified: Secondary | ICD-10-CM | POA: Diagnosis present

## 2024-02-06 DIAGNOSIS — F17210 Nicotine dependence, cigarettes, uncomplicated: Secondary | ICD-10-CM | POA: Diagnosis not present

## 2024-02-06 DIAGNOSIS — J45909 Unspecified asthma, uncomplicated: Secondary | ICD-10-CM | POA: Insufficient documentation

## 2024-02-06 LAB — CBC WITH DIFFERENTIAL/PLATELET
Abs Immature Granulocytes: 0 10*3/uL (ref 0.00–0.07)
Basophils Absolute: 0 10*3/uL (ref 0.0–0.1)
Basophils Relative: 1 %
Eosinophils Absolute: 0.1 10*3/uL (ref 0.0–0.5)
Eosinophils Relative: 1 %
HCT: 43.5 % (ref 39.0–52.0)
Hemoglobin: 14.8 g/dL (ref 13.0–17.0)
Immature Granulocytes: 0 %
Lymphocytes Relative: 36 %
Lymphs Abs: 1.6 10*3/uL (ref 0.7–4.0)
MCH: 33.1 pg (ref 26.0–34.0)
MCHC: 34 g/dL (ref 30.0–36.0)
MCV: 97.3 fL (ref 80.0–100.0)
Monocytes Absolute: 0.5 10*3/uL (ref 0.1–1.0)
Monocytes Relative: 10 %
Neutro Abs: 2.3 10*3/uL (ref 1.7–7.7)
Neutrophils Relative %: 52 %
Platelets: 176 10*3/uL (ref 150–400)
RBC: 4.47 MIL/uL (ref 4.22–5.81)
RDW: 13 % (ref 11.5–15.5)
WBC: 4.5 10*3/uL (ref 4.0–10.5)
nRBC: 0 % (ref 0.0–0.2)

## 2024-02-06 LAB — URINALYSIS, ROUTINE W REFLEX MICROSCOPIC
Bilirubin Urine: NEGATIVE
Glucose, UA: NEGATIVE mg/dL
Hgb urine dipstick: NEGATIVE
Ketones, ur: NEGATIVE mg/dL
Leukocytes,Ua: NEGATIVE
Nitrite: NEGATIVE
Protein, ur: NEGATIVE mg/dL
Specific Gravity, Urine: 1.015 (ref 1.005–1.030)
pH: 6.5 (ref 5.0–8.0)

## 2024-02-06 LAB — COMPREHENSIVE METABOLIC PANEL WITH GFR
ALT: 25 U/L (ref 0–44)
AST: 26 U/L (ref 15–41)
Albumin: 3.8 g/dL (ref 3.5–5.0)
Alkaline Phosphatase: 53 U/L (ref 38–126)
Anion gap: 10 (ref 5–15)
BUN: 17 mg/dL (ref 6–20)
CO2: 23 mmol/L (ref 22–32)
Calcium: 9.6 mg/dL (ref 8.9–10.3)
Chloride: 102 mmol/L (ref 98–111)
Creatinine, Ser: 1.08 mg/dL (ref 0.61–1.24)
GFR, Estimated: >60 mL/min (ref >60)
Glucose, Bld: 100 mg/dL — ABNORMAL HIGH (ref 70–99)
Potassium: 4.8 mmol/L (ref 3.5–5.1)
Sodium: 135 mmol/L (ref 135–145)
Total Bilirubin: 0.8 mg/dL (ref 0.0–1.2)
Total Protein: 7.6 g/dL (ref 6.5–8.1)

## 2024-02-06 LAB — RESP PANEL BY RT-PCR (RSV, FLU A&B, COVID)  RVPGX2
Influenza A by PCR: NEGATIVE
Influenza B by PCR: NEGATIVE
Resp Syncytial Virus by PCR: NEGATIVE
SARS Coronavirus 2 by RT PCR: NEGATIVE

## 2024-02-06 LAB — LIPASE, BLOOD: Lipase: 34 U/L (ref 11–51)

## 2024-02-06 LAB — MAGNESIUM: Magnesium: 2.1 mg/dL (ref 1.7–2.4)

## 2024-02-06 LAB — TROPONIN I (HIGH SENSITIVITY): Troponin I (High Sensitivity): 16 ng/L (ref <18)

## 2024-02-06 MED ORDER — SODIUM CHLORIDE 0.9 % IV BOLUS
1000.0000 mL | Freq: Once | INTRAVENOUS | Status: AC
  Administered 2024-02-06: 1000 mL via INTRAVENOUS

## 2024-02-06 MED ORDER — ONDANSETRON HCL 4 MG/2ML IJ SOLN: 4.0000 mg | Freq: Once | INTRAMUSCULAR | Status: DC

## 2024-02-06 NOTE — Discharge Instructions (Signed)
 It was a pleasure caring for you today in the emergency department.  Be sure to drink plenty of fluids over the next few days.  Eat a bland diet.  Follow-up with your PCP for recheck.  Can take Imodium for the diarrhea if needed.  Please return to the emergency department for any worsening or worrisome symptoms.

## 2024-02-06 NOTE — ED Provider Notes (Signed)
 [place] [facility] Provider Note  CSN: [id number] Arrival date & time: 02/06/24 0944  Chief Complaint(s) Diarrhea  HPI Allen Sawyer is a 63 y.o. male with past medical history as below, significant for asthma, OSA on CPAP, tobacco use, rhinitis who presents to the ED with complaint of diarrhea x 3 days, generalized fatigue  Patient reports diarrhea since Saturday, nausea without vomiting.  No BRBPR or melena.  Diarrhea seems to be improving over the past 24 hours.  He is having generalized weakness, fatigue.  Difficulty exercising due to fatigue.  Does report reduced p.o. intake last few days secondary to nausea.  Nausea has improved.  No abdominal pain.  No change urination.  No fevers or chills.  No chest pain or dyspnea.  Past Medical History Past Medical History:  Diagnosis Date   Asthma    Sleep apnea    Patient Active Problem List   Diagnosis Date Noted   OSA on CPAP 12/01/2023   Preventative health care 03/22/2023   Mild persistent asthma without complication 01/11/2023   Seasonal allergic conjunctivitis 01/11/2023   Seasonal and perennial allergic rhinitis 01/11/2023   Tobacco use 01/11/2023   Degenerative tear of medial meniscus of right knee 07/27/2022   Synovial cyst of right popliteal space 06/29/2022   Capsulitis of left shoulder 04/13/2022   Cervical radiculopathy 07/29/2021   Seasonal allergies 03/08/2021   Acute midline thoracic back pain 08/19/2020   Low back pain 08/22/2011   Home Medication(s) Prior to Admission medications   Medication Sig Start Date End Date Taking? Authorizing Provider  Albuterol-Budesonide (AIRSUPRA) 90-80 MCG/ACT AERO Inhale 2 puffs into the lungs every 4 (four) hours as needed. 04/27/23   Verlee Monte, MD  amoxicillin (AMOXIL) 500 MG capsule Take 1 capsule (500 mg total) by mouth 3 (three) times daily. 12/08/23   Fayrene Helper, PA-C  Azelastine-Fluticasone [facility]) 137-50 MCG/ACT SUSP Place 1 spray  into both nostrils 2 (two) times daily as needed. 12/01/23   Sharlene Dory, DO  benzonatate (TESSALON) 100 MG capsule Take 1 capsule (100 mg total) by mouth every 8 (eight) hours. 12/08/23   Fayrene Helper, PA-C  budesonide-formoterol [facility]) 160-4.5 MCG/ACT inhaler Inhale 2 puffs into the lungs 2 (two) times daily. 04/27/23   Verlee Monte, MD  doxycycline (VIBRAMYCIN) 100 MG capsule Take 1 capsule (100 mg total) by mouth 2 (two) times daily. 12/08/23   Fayrene Helper, PA-C  hydrOXYzine (VISTARIL) 25 MG capsule Take 1 capsule (25 mg total) by mouth every 8 (eight) hours as needed for anxiety. 03/10/23   Clayborne Dana, NP  levocetirizine (XYZAL) 5 MG tablet Take 1 tablet (5 mg total) by mouth every evening. 09/01/23   Clayborne Dana, NP  montelukast (SINGULAIR) 10 MG tablet Take 1 tablet (10 mg total) by mouth at bedtime. 09/01/23   Clayborne Dana, NP  Olopatadine HCl 0.2 % SOLN Apply 1 drop to eye as needed. 04/27/23   Verlee Monte, MD  Past Surgical History Past Surgical History:  Procedure Laterality Date   APPENDECTOMY     Family History Family History  Problem Relation Age of Onset   Hyperlipidemia Mother    Hypertension Mother    Hyperlipidemia Father    Hypertension Father    Heart attack Father    Eczema Sister    Hyperlipidemia Sister    Hypertension Sister    Hypertension Brother    Hyperlipidemia Brother    Diabetes Brother    Heart attack Paternal Uncle    Sudden death Neg Hx     Social History Social History   Tobacco Use   Smoking status: Some Days    Current packs/day: 0.25    Types: Cigarettes, Cigars    Passive exposure: Never   Smokeless tobacco: Never  Vaping Use   Vaping status: Never Used  Substance Use Topics   Alcohol use: Yes    Alcohol/week: 1.0 - 2.0 standard drink of alcohol    Types: 1 - 2 Shots of liquor per week     Comment: occ   Drug use: No   Allergies Patient has no known allergies.  Review of Systems A thorough review of systems was obtained and all systems are negative except as noted in the HPI and PMH.   Physical Exam Vital Signs  I have reviewed the triage vital signs BP (!) 166/103   Pulse 75   Temp 98.4 F (36.9 C)   Resp 16   Wt 106.6 kg   SpO2 98%   BMI 30.17 kg/m  Physical Exam Vitals and nursing note reviewed.  Constitutional:      General: He is not in acute distress.    Appearance: Normal appearance. He is well-developed. He is not ill-appearing.  HENT:     Head: Normocephalic and atraumatic.     Right Ear: External ear normal.     Left Ear: External ear normal.     Nose: Nose normal.     Mouth/Throat:     Mouth: Mucous membranes are moist.  Eyes:     General: No scleral icterus.       Right eye: No discharge.        Left eye: No discharge.  Cardiovascular:     Rate and Rhythm: Normal rate.  Pulmonary:     Effort: Pulmonary effort is normal. No respiratory distress.     Breath sounds: No stridor.  Abdominal:     General: Abdomen is flat. There is no distension.     Palpations: Abdomen is soft.     Tenderness: There is no abdominal tenderness. There is no guarding.  Musculoskeletal:        General: No deformity.     Cervical back: No rigidity.  Skin:    General: Skin is warm and dry.     Coloration: Skin is not cyanotic, jaundiced or pale.  Neurological:     Mental Status: He is alert and oriented to person, place, and time.     GCS: GCS eye subscore is 4. GCS verbal subscore is 5. GCS motor subscore is 6.  Psychiatric:        Speech: Speech normal.        Behavior: Behavior normal. Behavior is cooperative.     ED Results and Treatments Labs (all labs ordered are listed, but only abnormal results are displayed) Labs Reviewed  COMPREHENSIVE METABOLIC PANEL WITH GFR - Abnormal; Notable for the following components:      Result Value  Glucose,  Bld 100 (*)    All other components within normal limits  RESP PANEL BY RT-PCR (RSV, FLU A&B, COVID)  RVPGX2  CBC WITH DIFFERENTIAL/PLATELET  LIPASE, BLOOD  URINALYSIS, ROUTINE W REFLEX MICROSCOPIC  MAGNESIUM  TROPONIN I (HIGH SENSITIVITY)                                                                                                                          Radiology No results found.  Pertinent labs & imaging results that were available during my care of the patient were reviewed by me and considered in my medical decision making (see MDM for details).  Medications Ordered in ED Medications  ondansetron (ZOFRAN) injection 4 mg (0 mg Intravenous Hold 02/06/24 1030)  sodium chloride 0.9 % bolus 1,000 mL (1,000 mLs Intravenous New Bag/Given 02/06/24 1029)                                                                                                                                     Procedures Procedures  (including critical care time)  Medical Decision Making / ED Course    Medical Decision Making:    Key Cen is a 63 y.o. male with past medical history as below, significant for asthma, OSA on CPAP, tobacco use, rhinitis who presents to the ED with complaint of diarrhea x 3 days, generalized fatigue. The complaint involves an extensive differential diagnosis and also carries with it a high risk of complications and morbidity.  Serious etiology was considered. Ddx includes but is not limited to: Gastroenteritis, dehydration, electrolyte derangement, viral syndrome, ACS, UTI, foodborne illness, etc.  Complete initial physical exam performed, notably the patient was in no distress, sitting comfortably, abdomen benign.    Reviewed and confirmed nursing documentation for past medical history, family history, social history.  Vital signs reviewed.    Clinical Course as of 02/06/24 1154  Tue Feb 06, 2024  1116 Symptoms improved  [SG]    Clinical Course User Index [SG] Sloan Leiter, DO    Brief summary: 63 year old male here with history as above here complaining of diarrhea, fatigue/generalized weakness. Diarrhea seems to be improving, nausea improving.  Been feeling weak generalized past couple days in conjunction with the diarrhea.  Reduced p.o. intake. Exam is reassuring, abdomen is benign, will check screening labs give fluids and antiemetic and plan recheck    Labs reviewed,  these are stable.  Symptoms greatly improved following IV fluids.  Possibly mild dehydration.  His diarrhea is greatly improved over the past 24 hours.  Recommend brat diet, Imodium as needed.  Encourage rehydration, follow-up with PCP.  The patient improved significantly and was discharged in stable condition. Detailed discussions were had with the patient/guardian regarding current findings, and need for close f/u with PCP or on call doctor. The patient/guardian has been instructed to return immediately if the symptoms worsen in any way for re-evaluation. Patient/guardian verbalized understanding and is in agreement with current care plan. All questions answered prior to discharge.              Additional history obtained: -Additional history obtained from na -External records from outside source obtained and reviewed including: Chart review including previous notes, labs, imaging, consultation notes including  Prior ED evaluation, primary care documentation, prior labs and imaging   Lab Tests: -I ordered, reviewed, and interpreted labs.   The pertinent results include:   Labs Reviewed  COMPREHENSIVE METABOLIC PANEL WITH GFR - Abnormal; Notable for the following components:      Result Value   Glucose, Bld 100 (*)    All other components within normal limits  RESP PANEL BY RT-PCR (RSV, FLU A&B, COVID)  RVPGX2  CBC WITH DIFFERENTIAL/PLATELET  LIPASE, BLOOD  URINALYSIS, ROUTINE W REFLEX MICROSCOPIC  MAGNESIUM  TROPONIN I (HIGH SENSITIVITY)    Notable for labs  stable  EKG   EKG Interpretation Date/Time:    Ventricular Rate:    PR Interval:    QRS Duration:    QT Interval:    QTC Calculation:   R Axis:      Text Interpretation:           Imaging Studies ordered: na   Medicines ordered and prescription drug management: Meds ordered this encounter  Medications   sodium chloride 0.9 % bolus 1,000 mL   ondansetron (ZOFRAN) injection 4 mg    -I have reviewed the patients home medicines and have made adjustments as needed   Consultations Obtained: na   Cardiac Monitoring: Continuous pulse oximetry interpreted by myself, 100% on RA.    Social Determinants of Health:  Diagnosis or treatment significantly limited by social determinants of health: current smoker   Reevaluation: After the interventions noted above, I reevaluated the patient and found that they have improved  Co morbidities that complicate the patient evaluation  Past Medical History:  Diagnosis Date   Asthma    Sleep apnea       Dispostion: Disposition decision including need for hospitalization was considered, and patient discharged from emergency department.    Final Clinical Impression(s) / ED Diagnoses Final diagnoses:  Diarrhea, unspecified type  Dehydration        Sloan Leiter, DO 02/06/24 1154

## 2024-02-06 NOTE — ED Triage Notes (Signed)
 Diarrhea x 5 days , denies abd pain . No emesis .   Also reports pollen allergies , headache .

## 2024-03-22 ENCOUNTER — Ambulatory Visit: Admitting: Family Medicine

## 2024-03-22 ENCOUNTER — Encounter: Payer: Self-pay | Admitting: Family Medicine

## 2024-03-22 VITALS — BP 134/82 | HR 87 | Temp 98.0°F | Resp 16 | Ht 74.0 in | Wt 238.0 lb

## 2024-03-22 DIAGNOSIS — A084 Viral intestinal infection, unspecified: Secondary | ICD-10-CM | POA: Diagnosis not present

## 2024-03-22 MED ORDER — ONDANSETRON 4 MG PO TBDP: 4.0000 mg | ORAL_TABLET | Freq: Three times a day (TID) | ORAL | 0 refills | Status: DC | PRN

## 2024-03-22 NOTE — Patient Instructions (Signed)
 As long as you are vomiting or having diarrhea, please drink fluids with electrolytes like Gatorade, Powerade, or Pedialyte (if you can stomach the taste). Drink these along with water mainly.   Stay hydrated.  Let us  know if you need anything.

## 2024-03-22 NOTE — Progress Notes (Signed)
 Chief Complaint  Patient presents with   Diarrhea    Diarrhea      Subjective Allen Sawyer is a 63 y.o. male who presents with vomiting and diarrhea Symptoms began after he at some bad cabbage yesterday.  Patient has vomiting, diarrhea, and nausea Patient denies abdominal pain, fever, irritability, URI symptoms, and fevers Treatment to date: no Sick contacts: none known  Past Medical History:  Diagnosis Date   Asthma    Sleep apnea     Exam BP 134/82 (BP Location: Left Arm, Patient Position: Sitting)   Pulse 87   Temp 98 F (36.7 C) (Oral)   Resp 16   Ht 6\' 2"  (1.88 m)   Wt 238 lb (108 kg)   SpO2 98%   BMI 30.56 kg/m  General:  well developed, well hydrated, in no apparent distress Skin:  warm, no pallor or diaphoresis, no rashes Throat/Pharynx: MMM Lungs:  clear to auscultation, breath sounds equal bilaterally, no respiratory distress, no wheezes Cardio:  RRR Abdomen:  abdomen soft, nontender; bowel sounds normal; no masses or organomegaly Psych: Appropriate judgement/insight  Assessment and Plan  Viral gastroenteritis - Plan: ondansetron  (ZOFRAN -ODT) 4 MG disintegrating tablet  Letter for work today given excusing for today, yesterday, and through Sunday.  Zofran  as needed. Avoid aggravating foods, discussed advancing diet. F/u as originally scheduled or as needed. The patient voiced understanding and agreement to the plan.  Shellie Dials [place], DO 03/22/24  2:54 PM

## 2024-05-13 ENCOUNTER — Encounter: Payer: Self-pay | Admitting: Family Medicine

## 2024-05-13 ENCOUNTER — Ambulatory Visit: Admitting: Family Medicine

## 2024-05-13 VITALS — BP 136/84 | HR 97 | Temp 98.0°F | Resp 16 | Ht 74.0 in | Wt 238.0 lb

## 2024-05-13 DIAGNOSIS — M7712 Lateral epicondylitis, left elbow: Secondary | ICD-10-CM

## 2024-05-13 NOTE — Progress Notes (Signed)
 Musculoskeletal Exam  Patient: Allen Sawyer DOB: May 10, 1961  DOS: 05/13/2024  SUBJECTIVE:  Chief Complaint:   Chief Complaint  Patient presents with   Follow-up    Follow Up    Allen Sawyer is a 62 y.o.  male for evaluation and treatment of L elbow pain.   Onset:  2 days ago. No inj. Was driving a stand up forklift. Location: lateral elbow Character:  sharp  Progression of issue:  has improved Associated symptoms: no bruising, redness, swelling, decreased ROM Treatment: to date has been rest.   Neurovascular symptoms: no  Past Medical History:  Diagnosis Date   Asthma    Sleep apnea     Objective: VITAL SIGNS: BP 136/84 (BP Location: Left Arm, Patient Position: Sitting)   Pulse 97   Temp 98 F (36.7 C) (Oral)   Resp 16   Ht 6' 2 (1.88 m)   Wt 238 lb (108 kg)   BMI 30.56 kg/m  Constitutional: Well formed, well developed. No acute distress. Thorax & Lungs: No accessory muscle use Musculoskeletal: Elbow.   Normal active range of motion: yes.   Normal passive range of motion: yes Tenderness to palpation: yes, lateral epicondyle Deformity: no Ecchymosis: no +reproduction of pain with resisted wrist extension Neurologic: Normal sensory function. Psychiatric: Normal mood. Age appropriate judgment and insight. Alert & oriented x 3.    Assessment:  Lateral epicondylitis of left elbow  Plan: Stretches/exercises, heat, ice, Tylenol . Consider forearm strap while working. Letter for work given.  F/u as originally scheduled. The patient voiced understanding and agreement to the plan.   Mabel Mt [place], DO 05/13/24  1:48 PM

## 2024-05-13 NOTE — Patient Instructions (Addendum)
 Ice/cold pack over area for 10-15 min twice daily.  Consider a forearm strap (Band-IT) to help with your elbow. This can give your elbow a break and allow it to heal faster.  OK to take Tylenol  1000 mg (2 extra strength tabs) or 975 mg (3 regular strength tabs) every 6 hours as needed.  Please contact the GI team at: [phone]  Let us  know if you need anything.  Elbow and Forearm Exercises It is normal to feel mild stretching, pulling, tightness, or discomfort as you do these exercises, but you should stop right away if you feel sudden pain or your pain gets worse.  RANGE OF MOTION EXERCISES These exercises warm up your muscles and joints and improve the movement and flexibility of your injured elbow and forearm. These exercises also help to relieve pain, numbness, and tingling. These exercises are done using the muscles in your injured elbow and forearm. Exercise A: Elbow Flexion, Active Hold your left / right arm at your side, and bend your elbow as far as you can using your left / right arm muscles. Hold this position for 30 seconds. Slowly return to the starting position. Repeat 2 times. Complete this exercise 3 times per week. Exercise B: Elbow Extension, Active Hold your left / right arm at your side, and straighten your elbow as much as you can using your left / right arm muscles. Hold this position for 30 seconds. Slowly return to the starting position. Repeat 2 times. Complete this exercise 3 times per week. Exercise C: Forearm Rotation, Supination, Active Stand or sit with your elbows at your sides. Bend your left / right elbow to an L shape (90 degrees). Turn your palm upward until you feel a gentle stretch on the inside of your forearm. Hold this position for 30 seconds. Slowly release and return to the starting position. Repeat 2 times. Complete this exercise 3 times per week. Exercise D: Forearm Rotation, Pronation, Active Stand or sit with your elbows at your  side. Bend your left / right elbow to an L shape (90 degrees). Turn your left / right palm downward until you feel a gentle stretch on the top of your forearm. Hold this position for 30 seconds. Slowly release and return to the starting position. Repeat 2 times. Complete this exercise 3 times per week. STRETCHING EXERCISES These exercises warm up your muscles and joints and improve the movement and flexibility of your injured elbow and forearm. These exercises also help to relieve pain, numbness, and tingling. These exercises are done using your healthy elbow and forearm to help stretch the muscles in your injured elbow and forearm. Exercise E: Elbow Flexion, Active-Assisted  Hold your left / right arm at your side, and bend your elbow as much as you can using your left / right arm muscles. Use your other hand to bend your left / right elbow farther. To do this, gently push up on your forearm until you feel a gentle stretch on the back of your elbow. Hold this position for 30 seconds. Slowly return to the starting position. Repeat 2 times. Complete this exercise 3 times per week. Exercise F: Elbow Extension, Active-Assisted  Hold your left / right arm at your side, and straighten your elbow as much as you can using your left / right arm muscles. Use your other hand to straighten the left / right elbow farther. To do this, gently push down on your forearm until you feel a gentle stretch on the inside of  your elbow. Hold this position for 30 seconds. Slowly return to the starting position. Repeat 2 times. Complete this exercise 3 times per weeky. Exercise G: Forearm Rotation, Supination, Active-Assisted  Sit with your left / right elbow bent in an L shape (90 degrees) with your forearm resting on a table. Keeping your upper body and shoulder still, rotate your forearm so your left / right palm faces upward. Use your other hand to help rotate your forearm further until you feel a gentle to  moderate stretch. Hold this position for 30 seconds. Slowly release the stretch and return to the starting position. Repeat 2 times. Complete this exercise 3 times per week. Exercise H: Forearm Rotation, Pronation, Active-Assisted  Sit with your left / right elbow bent in an L shape (90 degrees) with your forearm resting on a table. Keeping your upper body and shoulder still, rotate your forearm so your palm faces the tabletop. Use your other hand to help rotate your forearm further until you feel a gentle to moderate stretch. Hold this position for 30 seconds. Slowly release the stretch and return to the starting position. Repeat 2 times. Complete this exercise 3 times per week. Exercise I: Elbow Flexion, Supine, Passive Lie on your back. Extend your left / right arm up in the air, bracing it with your other hand. Let your left / right your hand slowly lower toward your shoulder, while your elbow stays pointed toward the ceiling. You should feel a gentle stretch along the back of your upper arm and elbow. If instructed by your health care provider, you may increase the intensity of your stretch by adding a small wrist weight or hand weight. Hold this position for 3 seconds. Slowly return to the starting position. Repeat 2 times. Complete this exercise 3 times per week. Exercise J: Elbow Extension, Supine, Passive  Lie on your back. Make sure that you are in a comfortable position that lets you relax your arm muscles. Place a folded towel under your left / right upper arm so your elbow and shoulder are at the same height. Straighten your left / right arm so your elbow does not rest on the bed or towel. Let the weight of your hand stretch your elbow. Keep your arm and chest muscles relaxed. You should feel a stretch on the inside of your elbow. If told by your health care provider, you may increase the intensity of your stretch by adding a small wrist weight or hand weight. Hold this  position for 30 seconds. Slowly release the stretch. Repeat 2 times. Complete this exercise 3 times per week. STRENGTHENING EXERCISES These exercises build strength and endurance in your elbow and forearm. Endurance is the ability to use your muscles for a long time, even after they get tired. Exercise K: Elbow Flexion, Isometric  Stand or sit up straight. Bend your left / right elbow in an L shape (90 degrees) and turn your palm up so your forearm is at the height of your waist. Place your other hand on top of your forearm. Gently push down as your left / right arm resists. Push as hard as you can with both arms without causing any pain or movement at your left / right elbow. Hold this position for 3 seconds. Slowly release the tension in both arms. Let your muscles relax completely before repeating. Repeat 2 times. Complete this exercise 3 times per week. Exercise L: Elbow Extensors, Isometric  Stand or sit up straight. Place your left /  right arm so your palm faces your abdomen and it is at the height of your waist. Place your other hand on the underside of your forearm. Gently push up as your left / right arm resists. Push as hard as you can with both arms, without causing any pain or movement at your left / right elbow. Hold this position for 3 seconds. Slowly release the tension in both arms. Let your muscles relax completely before repeating. Repeat _______2___ times. Complete this exercise 3 times per week. Exercise M: Elbow Flexion With Forearm Palm Up  Sit upright on a firm chair without armrests, or stand. Place your left / right arm at your side with your palm facing forward. Holding a 5 lbweight or gripping a rubber exercise band or tubing, bend your elbow to bring your hand toward your shoulder. Hold this position for 3 seconds. Slowly return to the starting position. Repeat 2 times. Complete this exercise 3 times per week. Exercise N: Elbow Extension  Sit on a firm  chair without armrests, or stand. Keeping your upper arms at your sides, bring both hands up toward your left / right shoulder while you grip a rubber exercise band or tubing. Your left / right hand should be just below the other hand. Straighten your left / right elbow. Hold this position for 3 seconds. Control the resistance of the band or tubing as your hand returns to your side. Repeat 2 times. Complete this exercise 3 times per week. Exercise O: Forearm Rotation, Supination  Sit with your left / right forearm supported on a table. Keep your elbow at waist height. Rest your hand over the edge of the table with your palm facing down. Gently hold a lightweight hammer. Without moving your elbow, slowly rotate your forearm to turn your palm and hand upward to a "thumbs-up" position. Hold this position for 3 seconds. Slowly return to the starting position. Repeat 2 times. Complete this exercise 3 times per week. Exercise P: Forearm Rotation, Pronation  Sit with your left / right forearm supported on a table. Keep your elbow below shoulder height. Rest your hand over the edge of the table with your palm facing up. Gently hold a lightweight hammer. Without moving your elbow, slowly rotate your forearm to turn your palm and hand upward to a "thumbs-up" position. Hold this position for 3 seconds. Slowly return to the starting position. Repeat 2 times. Complete this exercise 3 times per week.  Make sure you discuss any questions you have with your health care provider. Document Released: 08/31/2005 Document Revised: 02/25/2016 Document Reviewed: 07/12/2015 Elsevier Interactive Patient Education  [employer].

## 2024-06-13 ENCOUNTER — Ambulatory Visit: Admitting: Family

## 2024-06-13 ENCOUNTER — Other Ambulatory Visit (HOSPITAL_BASED_OUTPATIENT_CLINIC_OR_DEPARTMENT_OTHER): Payer: Self-pay

## 2024-06-13 ENCOUNTER — Other Ambulatory Visit: Payer: Self-pay | Admitting: Family

## 2024-06-13 ENCOUNTER — Encounter: Payer: Self-pay | Admitting: Family

## 2024-06-13 ENCOUNTER — Encounter (HOSPITAL_BASED_OUTPATIENT_CLINIC_OR_DEPARTMENT_OTHER): Payer: Self-pay

## 2024-06-13 VITALS — BP 152/84 | HR 76 | Temp 98.3°F | Ht 74.0 in | Wt 239.2 lb

## 2024-06-13 DIAGNOSIS — R030 Elevated blood-pressure reading, without diagnosis of hypertension: Secondary | ICD-10-CM | POA: Diagnosis not present

## 2024-06-13 DIAGNOSIS — J019 Acute sinusitis, unspecified: Secondary | ICD-10-CM | POA: Diagnosis not present

## 2024-06-13 MED ORDER — PREDNISONE 20 MG PO TABS
ORAL_TABLET | ORAL | 0 refills | Status: DC
Start: 2024-06-13 — End: 2024-08-05
  Filled 2024-06-13: qty 10, 7d supply, fill #0

## 2024-06-13 MED ORDER — AMOXICILLIN-POT CLAVULANATE 875-125 MG PO TABS
1.0000 | ORAL_TABLET | Freq: Two times a day (BID) | ORAL | 0 refills | Status: AC
  Filled 2024-06-13: qty 14, 7d supply, fill #0

## 2024-06-13 NOTE — Progress Notes (Signed)
 Allen Sawyer is a 63 y.o. male with the following history as recorded in [employer]:  Patient Active Problem List   Diagnosis Date Noted   OSA on CPAP 12/01/2023   Preventative health care 03/22/2023   Mild persistent asthma without complication 01/11/2023   Seasonal allergic conjunctivitis 01/11/2023   Seasonal and perennial allergic rhinitis 01/11/2023   Tobacco use 01/11/2023   Degenerative tear of medial meniscus of right knee 07/27/2022   Synovial cyst of right popliteal space 06/29/2022   Capsulitis of left shoulder 04/13/2022   Cervical radiculopathy 07/29/2021   Seasonal allergies 03/08/2021   Acute midline thoracic back pain 08/19/2020   Low back pain 08/22/2011    Current Outpatient Medications  Medication Sig Dispense Refill   Albuterol -Budesonide  (AIRSUPRA ) 90-80 MCG/ACT AERO Inhale 2 puffs into the lungs every 4 (four) hours as needed. 10.7 g 2   amoxicillin -clavulanate (AUGMENTIN ) 875-125 MG tablet Take 1 tablet by mouth 2 (two) times daily for 7 days. 14 tablet 0   Azelastine -Fluticasone  (DYMISTA ) 137-50 MCG/ACT SUSP Place 1 spray into both nostrils 2 (two) times daily as needed. 1 g 5   hydrOXYzine  (VISTARIL ) 25 MG capsule Take 1 capsule (25 mg total) by mouth every 8 (eight) hours as needed for anxiety. 30 capsule 0   levocetirizine (XYZAL ) 5 MG tablet Take 1 tablet (5 mg total) by mouth every evening. 90 tablet 3   Olopatadine  HCl 0.2 % SOLN Apply 1 drop to eye as needed. 2.5 mL 0   ondansetron  (ZOFRAN -ODT) 4 MG disintegrating tablet Take 1 tablet (4 mg total) by mouth every 8 (eight) hours as needed for nausea or vomiting. 20 tablet 0   predniSONE  (DELTASONE ) 20 MG tablet Take 2 tablets by mouth daily for 2 days, then take 1 tablet daily for 5 days 10 tablet 0   budesonide -formoterol  (SYMBICORT ) 160-4.5 MCG/ACT inhaler Inhale 2 puffs into the lungs 2 (two) times daily. (Patient not taking: Reported on 06/13/2024) 1 each 5   montelukast  (SINGULAIR ) 10 MG tablet Take 1  tablet (10 mg total) by mouth at bedtime. (Patient not taking: Reported on 06/13/2024) 90 tablet 3   No current facility-administered medications for this visit.    Allergies: Patient has no known allergies.  Past Medical History:  Diagnosis Date   Asthma    Sleep apnea     Past Surgical History:  Procedure Laterality Date   APPENDECTOMY      Family History  Problem Relation Age of Onset   Hyperlipidemia Mother    Hypertension Mother    Hyperlipidemia Father    Hypertension Father    Heart attack Father    Eczema Sister    Hyperlipidemia Sister    Hypertension Sister    Hypertension Brother    Hyperlipidemia Brother    Diabetes Brother    Heart attack Paternal Uncle    Sudden death Neg Hx     Social History   Tobacco Use   Smoking status: Some Days    Current packs/day: 0.25    Types: Cigarettes, Cigars    Passive exposure: Never   Smokeless tobacco: Never  Substance Use Topics   Alcohol use: Yes    Alcohol/week: 1.0 - 2.0 standard drink of alcohol    Types: 1 - 2 Shots of liquor per week    Comment: occ    Subjective:   2 week history of sinus pain/ pressure; does have underlying allergies- notes he is not taking any medications prescribed by his allergist; only using Claritin   OTC daily as needed; asking for work note for today;   Objective:  Vitals:   06/13/24 1544 06/13/24 1553  BP: (!) 152/84 (!) 152/84  Pulse: 76   Temp: 98.3 F (36.8 C)   TempSrc: Oral   SpO2: 95%   Weight: 239 lb 3.2 oz (108.5 kg)   Height: 6' 2 (1.88 m)     General: Well developed, well nourished, in no acute distress  Skin : Warm and dry.  Head: Normocephalic and atraumatic  Eyes: Sclera and conjunctiva clear; pupils round and reactive to light; extraocular movements intact  Ears: External normal; canals clear; tympanic membranes normal  Oropharynx: Pink, supple. No suspicious lesions  Neck: Supple without thyromegaly, adenopathy  Lungs: Respirations unlabored; clear to  auscultation bilaterally without wheeze, rales, rhonchi  CVS exam: normal rate and regular rhythm.  Neurologic: Alert and oriented; speech intact; face symmetrical; moves all extremities well; CNII-XII intact without focal deficit   Assessment:  1. Acute sinusitis, recurrence not specified, unspecified location   2. Elevated blood pressure reading     Plan:  Suspect allergy  component; not currently taking his prescribed allergy  medications; encouraged to take as prescribed; Rx for Prednisone  and Augmentin ; work note given today as requested;  ? Related to illness; encouraged to start checking his blood pressure regularly and follow up with his PCP if consistently above 140/ 90;   No follow-ups on file.  No orders of the defined types were placed in this encounter.   Requested Prescriptions   Signed Prescriptions Disp Refills   predniSONE  (DELTASONE ) 20 MG tablet 10 tablet 0    Sig: Take 2 tablets by mouth daily for 2 days, then take 1 tablet daily for 5 days   amoxicillin -clavulanate (AUGMENTIN ) 875-125 MG tablet 14 tablet 0    Sig: Take 1 tablet by mouth 2 (two) times daily for 7 days.

## 2024-07-03 ENCOUNTER — Other Ambulatory Visit (HOSPITAL_BASED_OUTPATIENT_CLINIC_OR_DEPARTMENT_OTHER): Payer: Self-pay

## 2024-07-03 ENCOUNTER — Other Ambulatory Visit: Payer: Self-pay

## 2024-07-03 ENCOUNTER — Encounter (HOSPITAL_BASED_OUTPATIENT_CLINIC_OR_DEPARTMENT_OTHER): Payer: Self-pay | Admitting: Emergency Medicine

## 2024-07-03 ENCOUNTER — Emergency Department (HOSPITAL_BASED_OUTPATIENT_CLINIC_OR_DEPARTMENT_OTHER)
Admission: EM | Admit: 2024-07-03 | Discharge: 2024-07-03 | Disposition: A | Discharge status: Home / Self Care | Attending: Emergency Medicine | Admitting: Emergency Medicine

## 2024-07-03 DIAGNOSIS — R7309 Other abnormal glucose: Secondary | ICD-10-CM | POA: Diagnosis not present

## 2024-07-03 DIAGNOSIS — N481 Balanitis: Secondary | ICD-10-CM | POA: Diagnosis not present

## 2024-07-03 DIAGNOSIS — N4889 Other specified disorders of penis: Secondary | ICD-10-CM | POA: Diagnosis present

## 2024-07-03 LAB — CBG MONITORING, ED: Glucose-Capillary: 111 mg/dL — ABNORMAL HIGH (ref 70–99)

## 2024-07-03 MED ORDER — CLOTRIMAZOLE 1 % EX CREA
TOPICAL_CREAM | Freq: Two times a day (BID) | CUTANEOUS | 0 refills | Status: AC
  Filled 2024-07-03: qty 28, 10d supply, fill #0

## 2024-07-03 NOTE — ED Provider Notes (Signed)
 [place] [facility] Provider Note   CSN: [id number] Arrival date & time: 07/03/24  1321     Patient presents with: Dizziness   Allen Sawyer is a 63 y.o. male.   63 year old male with a history of sinusitis presents emergency department with penile discomfort.  Patient reports he recently completed a course of Augmentin  and prednisone  for sinusitis.  Says that several days ago he started feeling some burning on the foreskin of his penis when he felt the back is cracked and irritated.  No concern for STIs.  No penile discharge.  No testicular pain.  Says that this is caused him to get quite anxious and he has not been sleeping and thinks he developed a headache because of that.        Prior to Admission medications   Medication Sig Start Date End Date Taking? Authorizing Provider  clotrimazole  (LOTRIMIN ) 1 % cream Apply topically 2 (two) times daily for 10 days. Apply to affected area 2 times daily 07/03/24 07/13/24 Yes Yolande Lamar BROCKS, MD  Albuterol -Budesonide  (AIRSUPRA ) 90-80 MCG/ACT AERO Inhale 2 puffs into the lungs every 4 (four) hours as needed. 04/27/23   Marinda Rocky SAILOR, MD  Azelastine -Fluticasone  (DYMISTA ) 137-50 MCG/ACT SUSP Place 1 spray into both nostrils 2 (two) times daily as needed. 12/01/23   Frann Mabel Mt, DO  budesonide -formoterol  (SYMBICORT ) 160-4.5 MCG/ACT inhaler Inhale 2 puffs into the lungs 2 (two) times daily. Patient not taking: Reported on 06/13/2024 04/27/23   Marinda Rocky SAILOR, MD  hydrOXYzine  (VISTARIL ) 25 MG capsule Take 1 capsule (25 mg total) by mouth every 8 (eight) hours as needed for anxiety. 03/10/23   Almarie Waddell NOVAK, NP  levocetirizine (XYZAL ) 5 MG tablet Take 1 tablet (5 mg total) by mouth every evening. 09/01/23   Almarie Waddell NOVAK, NP  montelukast  (SINGULAIR ) 10 MG tablet Take 1 tablet (10 mg total) by mouth at bedtime. Patient not taking: Reported on 06/13/2024 09/01/23   Almarie Waddell B, NP  Olopatadine  HCl 0.2 %  SOLN Apply 1 drop to eye as needed. 04/27/23   Marinda Rocky SAILOR, MD  ondansetron  (ZOFRAN -ODT) 4 MG disintegrating tablet Take 1 tablet (4 mg total) by mouth every 8 (eight) hours as needed for nausea or vomiting. 03/22/24   Frann, Mabel Mt, DO  predniSONE  (DELTASONE ) 20 MG tablet Take 2 tablets by mouth daily for 2 days, then take 1 tablet daily for 5 days 06/13/24   Jason Leita Repine, FNP    Allergies: Patient has no known allergies.    Review of Systems  Updated Vital Signs BP (!) 145/71   Pulse 82   Temp 98 F (36.7 C) (Oral)   Resp 18   Wt 108.9 kg   SpO2 100%   BMI 30.81 kg/m   Physical Exam Genitourinary:    Comments: Mild amount of erythema and cracked skin of the foreskin when retracted.  Glans also appears somewhat erythematous.  No penile discharge.  No other rashes.  No erythema in the groin otherwise.    (all labs ordered are listed, but only abnormal results are displayed) Labs Reviewed  CBG MONITORING, ED - Abnormal; Notable for the following components:      Result Value   Glucose-Capillary 111 (*)    All other components within normal limits    EKG: EKG Interpretation Date/Time:  Wednesday July 03 2024 13:39:19 EDT Ventricular Rate:  72 PR Interval:  159 QRS Duration:  103 QT Interval:  390 QTC Calculation:  427 R Axis:   44  Text Interpretation: Sinus rhythm Confirmed by Yolande Charleston [phone]) on 07/03/2024 2:10:13 PM  Radiology: No results found.   Procedures   Medications Ordered in the ED - No data to display                                  Medical Decision Making  Allen Sawyer is a 63 year old male with a history of sinusitis who presents to the emergency department with penile discomfort  Initial Ddx:  Balanitis, UTI, STI  MDM/Course:  Patient presents emergency department penile discomfort.  Appears to have balanitis on exam.  No concern for STIs per the patient. Not having any other cellulitis or signs of fournier's  gangrene or other concerning findings.  He tells me that the other symptoms he thinks is because of the balanitis and he is not very concerned about his headache right now and the dizziness that he has been having.  He attributes this to not being able to sleep well bc of anxiety and his balanitis.  Will treat his balanitis and have him follow-up with his primary doctor in several days regarding his symptoms.  He would like to hold off on initiating blood pressure medication in time but would benefit from continued monitoring his blood pressure  This patient presents to the ED for concern of complaints listed in HPI, this involves an extensive number of treatment options, and is a complaint that carries with it a high risk of complications and morbidity. Disposition including potential need for admission considered.   Dispo: DC Home. Return precautions discussed including, but not limited to, those listed in the AVS. Allowed pt time to ask questions which were answered fully prior to dc.  Records reviewed Outpatient Clinic Notes I have reviewed the patients home medications and made adjustments as needed  Portions of this note were generated with Dragon dictation software. Dictation errors may occur despite best attempts at proofreading.     Final diagnoses:  Balanitis    ED Discharge Orders          Ordered    clotrimazole  (LOTRIMIN ) 1 % cream  2 times daily       Note to Pharmacy: package size   07/03/24 1414               Yolande Charleston BROCKS, MD 07/03/24 1615

## 2024-07-03 NOTE — ED Triage Notes (Signed)
 Reports possible allergic reaction to antibiotics that he was prescribed 2 weeks ago .  His symptoms are dizziness , diaphoretic and light headaches x 1 week .  Denies chest pain or shortness of breath. No Hx HTN .  Alert and oriented x 4 , ambulatory with steady gait .  Occipital headache .

## 2024-07-03 NOTE — Discharge Instructions (Addendum)
 You were seen for your balanitis in the emergency department.   At home, please perform sitz bath's occasionally on your penis and apply the clotrimazole  cream twice a day.    Check your MyChart online for the results of any tests that had not resulted by the time you left the emergency department.   Follow-up with your primary doctor in 2-3 days regarding your visit.  Follow-up with urology as soon as possible.  Return immediately to the emergency department if you experience any of the following: Worsening pain, or any other concerning symptoms.    Thank you for visiting our Emergency Department. It was a pleasure taking care of you today.

## 2024-08-05 ENCOUNTER — Ambulatory Visit: Admitting: Family Medicine

## 2024-08-05 ENCOUNTER — Encounter: Payer: Self-pay | Admitting: Family Medicine

## 2024-08-05 VITALS — BP 130/80 | HR 75 | Temp 97.5°F | Resp 16 | Ht 74.0 in | Wt 241.4 lb

## 2024-08-05 DIAGNOSIS — J302 Other seasonal allergic rhinitis: Secondary | ICD-10-CM | POA: Diagnosis not present

## 2024-08-05 DIAGNOSIS — Z1211 Encounter for screening for malignant neoplasm of colon: Secondary | ICD-10-CM

## 2024-08-05 DIAGNOSIS — Z23 Encounter for immunization: Secondary | ICD-10-CM

## 2024-08-05 DIAGNOSIS — J4530 Mild persistent asthma, uncomplicated: Secondary | ICD-10-CM | POA: Diagnosis not present

## 2024-08-05 MED ORDER — BUDESONIDE-FORMOTEROL FUMARATE 160-4.5 MCG/ACT IN AERO: 2.0000 | INHALATION_SPRAY | Freq: Two times a day (BID) | RESPIRATORY_TRACT | 5 refills | Status: AC

## 2024-08-05 MED ORDER — LEVOCETIRIZINE DIHYDROCHLORIDE 5 MG PO TABS: 5.0000 mg | ORAL_TABLET | Freq: Every evening | ORAL | 3 refills | Status: DC

## 2024-08-05 MED ORDER — MONTELUKAST SODIUM 10 MG PO TABS: 10.0000 mg | ORAL_TABLET | Freq: Every day | ORAL | 3 refills | Status: DC

## 2024-08-05 NOTE — Patient Instructions (Signed)
 The Singulair /montelukast  and Xyzal /levocetirizine, there are daily as needed for allergies.   Continue your inhalers and nasal sprays.   Let us  know if you need anything.

## 2024-08-05 NOTE — Progress Notes (Signed)
 Chief Complaint  Patient presents with   Allergies    Allergies     Allen Sawyer here for URI complaints.  Duration: a few months  Associated symptoms: sinus congestion, rhinorrhea, itchy watery eyes, wheezing, shortness of breath, and sneezing Denies: sinus pain, ear pain, ear drainage, sore throat, myalgia, and fevers Treatment to date: Steroid nasal spray, inhaler does help He has not been taking his levocetirizine or montelukast . Sick contacts: No  Past Medical History:  Diagnosis Date   Asthma    Sleep apnea     Objective BP 130/80 (BP Location: Left Arm, Patient Position: Sitting)   Pulse 75   Temp (!) 97.5 F (36.4 C) (Temporal)   Resp 16   Ht 6' 2 (1.88 m)   Wt 241 lb 6.4 oz (109.5 kg)   SpO2 98%   BMI 30.99 kg/m  General: Awake, alert, appears stated age HEENT: AT, [place], ears patent b/l and TM's neg, nares patent w/o discharge, pharynx pink and without exudates, MMM, no sinus TTP Neck: No masses or asymmetry Heart: RRR Lungs: CTAB, no accessory muscle use Psych: Age appropriate judgment and insight, normal mood and affect  Seasonal allergies - Plan: levocetirizine (XYZAL ) 5 MG tablet, montelukast  (SINGULAIR ) 10 MG tablet  Screen for colon cancer - Plan: Ambulatory referral to Gastroenterology  Mild persistent asthma without complication - Plan: montelukast  (SINGULAIR ) 10 MG tablet  Pneumococcal vaccination given - Plan: Pneumococcal conjugate vaccine 20-valent (Prevnar 20)  Exacerbation of chronic issue.  Add back montelukast  10 mg daily as needed, Xyzal  5 mg daily as needed.  Continue Dymista  daily.   Refer to GI again. Refill ICS/LABA.  Rinse mouth out after use. PCV 20 today. F/u as originally scheduled. Pt voiced understanding and agreement to the plan.  Allen [place], DO 08/05/24 4:32 PM

## 2024-09-19 ENCOUNTER — Encounter: Payer: Self-pay | Admitting: Internal Medicine

## 2024-10-01 ENCOUNTER — Ambulatory Visit: Admitting: Family

## 2024-10-01 VITALS — BP 137/81 | HR 71 | Temp 98.1°F | Resp 16 | Ht 74.0 in | Wt 245.0 lb

## 2024-10-01 DIAGNOSIS — J302 Other seasonal allergic rhinitis: Secondary | ICD-10-CM | POA: Diagnosis not present

## 2024-10-01 DIAGNOSIS — J3089 Other allergic rhinitis: Secondary | ICD-10-CM

## 2024-10-01 NOTE — Assessment & Plan Note (Addendum)
 Restart xyzal , restart singulair .  Advised pt to wear a mask while working in the [employer].

## 2024-10-01 NOTE — Patient Instructions (Addendum)
  VISIT SUMMARY: Allen Sawyer, during your visit, we discussed your recent allergy  symptoms, which started after you lost your prescription medication. You have been experiencing watering and burning eyes, a dry sensation, mild headache, weakness, and excessive sleepiness. 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  YOUR PLAN: -ALLERGIC RHINITIS: Allergic rhinitis is an allergic reaction that causes symptoms like a runny or stuffy nose, sneezing, and itching. Your symptoms have worsened due to the dusty environment at work and not taking your allergy  medication. To manage this, you should resume taking xyzal  and start taking Singulair  (montelukast ). Additionally, wearing a mask at work will help reduce exposure to dust. You should also take two days off work to rest and recover.  INSTRUCTIONS: Please resume taking Vyzal and start Singulair  (montelukast ) as prescribed. Wear a mask at work to minimize dust exposure and take two days off to rest. If symptoms persist or worsen, schedule a follow-up appointment.

## 2024-10-01 NOTE — Progress Notes (Signed)
 Subjective:     Patient ID: Allen Sawyer, male    DOB: [birth year], 63 y.o.   MRN: [id number]  Chief Complaint  Patient presents with   Nasal Congestion    Patient reports nasal congestion for about 3 days   Fatigue    Patient complains feeling very tired for about 3 days    HPI  Discussed the use of AI scribe software for clinical note transcription with the patient, who gave verbal consent to proceed.  History of Present Illness Allen Sawyer is a 63 year old male who presents with symptoms of allergy  exacerbation after losing his prescription medication.  Symptoms began approximately three days ago, including watering and burning eyes, a dry sensation, mild headache, weakness, and excessive sleepiness. He has felt excessively sleepy and weak, which has impacted his ability to work out for the past three to four days.  He attributes these symptoms to losing his prescription allergy  medication. 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 No fever is present, and he describes himself as generally healthy.  He mentions having a prescription for allergy  medication with refills available at his pharmacy. He does not currently take Singulair  (montelukast ) and is unsure about its use. He also uses a Symbicort  inhaler, which has refills available at the pharmacy.  No fever.      Health Maintenance Due  Topic Date Due   Colonoscopy  Never done   Zoster Vaccines- Shingrix (1 of 2) Never done   Influenza Vaccine  Never done   COVID-19 Vaccine (4 - 2025-26 season) 07/01/2024    Past Medical History:  Diagnosis Date   Asthma    Sleep apnea     Past Surgical History:  Procedure Laterality Date   APPENDECTOMY      Family History  Problem Relation Age of Onset   Hyperlipidemia Mother    Hypertension Mother    Hyperlipidemia Father    Hypertension Father    Heart attack Father    Eczema Sister    Hyperlipidemia  Sister    Hypertension Sister    Hypertension Brother    Hyperlipidemia Brother    Diabetes Brother    Heart attack Paternal Uncle    Sudden death Neg Hx     Social History   Socioeconomic History   Marital status: Single    Spouse name: Not on file   Number of children: Not on file   Years of education: Not on file   Highest education level: Not on file  Occupational History   Not on file  Tobacco Use   Smoking status: Some Days    Current packs/day: 0.25    Types: Cigarettes, Cigars    Passive exposure: Never   Smokeless tobacco: Never  Vaping Use   Vaping status: Never Used  Substance and Sexual Activity   Alcohol use: Yes    Alcohol/week: 1.0 - 2.0 standard drink of alcohol    Types: 1 - 2 Shots of liquor per week    Comment: occ   Drug use: No   Sexual activity: Not Currently  Other Topics Concern   Not on file  Social History Narrative   Not on file   Social Drivers of Health   Financial Resource Strain: Not on file  Food Insecurity: Not on file  Transportation Needs: Not on file  Physical Activity: Not on file  Stress: Not on file  Social Connections: Not on file  Intimate Partner Violence: Not on file    Outpatient Medications Prior to Visit  Medication Sig Dispense Refill   Albuterol -Budesonide  (AIRSUPRA ) 90-80 MCG/ACT AERO Inhale 2 puffs into the lungs every 4 (four) hours as needed. 10.7 g 2   Azelastine -Fluticasone  (DYMISTA ) 137-50 MCG/ACT SUSP Place 1 spray into both nostrils 2 (two) times daily as needed. 1 g 5   budesonide -formoterol  (SYMBICORT ) 160-4.5 MCG/ACT inhaler Inhale 2 puffs into the lungs 2 (two) times daily. Rinse mouth out after use. 1 each 5   levocetirizine (XYZAL ) 5 MG tablet Take 1 tablet (5 mg total) by mouth every evening. 90 tablet 3   montelukast  (SINGULAIR ) 10 MG tablet Take 1 tablet (10 mg total) by mouth at bedtime. 90 tablet 3   Olopatadine  HCl 0.2 % SOLN Apply 1 drop to eye as needed. 2.5 mL 0   No  facility-administered medications prior to visit.    No Known Allergies  ROS    See HPI Objective:    Physical Exam Constitutional:      General: He is not in acute distress.    Appearance: He is well-developed.  HENT:     Head: Normocephalic and atraumatic.     Comments: Mild voice hoarseness    Right Ear: Tympanic membrane and ear canal normal.     Left Ear: Tympanic membrane and ear canal normal.  Cardiovascular:     Rate and Rhythm: Normal rate and regular rhythm.     Heart sounds: No murmur heard. Pulmonary:     Effort: Pulmonary effort is normal. No respiratory distress.     Breath sounds: Normal breath sounds. No wheezing or rales.  Skin:    General: Skin is warm and dry.  Neurological:     Mental Status: He is alert and oriented to person, place, and time.  Psychiatric:        Behavior: Behavior normal.        Thought Content: Thought content normal.      BP 137/81 (BP Location: Right Arm, Patient Position: Sitting, Cuff Size: Large)   Pulse 71   Temp 98.1 F (36.7 C) (Oral)   Resp 16   Ht 6' 2 (1.88 m)   Wt 245 lb (111.1 kg)   SpO2 99%   BMI 31.46 kg/m  Wt Readings from Last 3 Encounters:  10/01/24 245 lb (111.1 kg)  08/05/24 241 lb 6.4 oz (109.5 kg)  07/03/24 240 lb (108.9 kg)      Assessment & Plan:   Problem List Items Addressed This Visit       Unprioritized   Seasonal and perennial allergic rhinitis - Primary   Restart xyzal , restart singulair .  Advised pt to wear a mask while working in the [employer].       I am having Norleen Antigua maintain his Airsupra , Olopatadine  HCl, Azelastine -Fluticasone , levocetirizine, montelukast , and budesonide -formoterol .  No orders of the defined types were placed in this encounter.

## 2024-10-29 ENCOUNTER — Encounter (HOSPITAL_BASED_OUTPATIENT_CLINIC_OR_DEPARTMENT_OTHER): Payer: Self-pay

## 2024-10-29 ENCOUNTER — Emergency Department (HOSPITAL_BASED_OUTPATIENT_CLINIC_OR_DEPARTMENT_OTHER): Admission: EM | Admit: 2024-10-29 | Discharge: 2024-10-29 | Disposition: A | Discharge status: Home / Self Care

## 2024-10-29 ENCOUNTER — Other Ambulatory Visit: Payer: Self-pay

## 2024-10-29 ENCOUNTER — Emergency Department (HOSPITAL_BASED_OUTPATIENT_CLINIC_OR_DEPARTMENT_OTHER)

## 2024-10-29 DIAGNOSIS — R0981 Nasal congestion: Secondary | ICD-10-CM | POA: Diagnosis not present

## 2024-10-29 DIAGNOSIS — R197 Diarrhea, unspecified: Secondary | ICD-10-CM | POA: Diagnosis not present

## 2024-10-29 DIAGNOSIS — J45909 Unspecified asthma, uncomplicated: Secondary | ICD-10-CM | POA: Diagnosis not present

## 2024-10-29 DIAGNOSIS — R059 Cough, unspecified: Secondary | ICD-10-CM | POA: Insufficient documentation

## 2024-10-29 DIAGNOSIS — R5381 Other malaise: Secondary | ICD-10-CM | POA: Diagnosis not present

## 2024-10-29 DIAGNOSIS — R5383 Other fatigue: Secondary | ICD-10-CM | POA: Diagnosis not present

## 2024-10-29 LAB — CBC
HCT: 46.1 % (ref 39.0–52.0)
Hemoglobin: 15.4 g/dL (ref 13.0–17.0)
MCH: 32.4 pg (ref 26.0–34.0)
MCHC: 33.4 g/dL (ref 30.0–36.0)
MCV: 96.8 fL (ref 80.0–100.0)
Platelets: 164 K/uL (ref 150–400)
RBC: 4.76 MIL/uL (ref 4.22–5.81)
RDW: 12.8 % (ref 11.5–15.5)
WBC: 5.1 K/uL (ref 4.0–10.5)
nRBC: 0 % (ref 0.0–0.2)

## 2024-10-29 LAB — BASIC METABOLIC PANEL WITH GFR
Anion gap: 10 (ref 5–15)
BUN: 10 mg/dL (ref 8–23)
CO2: 25 mmol/L (ref 22–32)
Calcium: 9 mg/dL (ref 8.9–10.3)
Chloride: 105 mmol/L (ref 98–111)
Creatinine, Ser: 1.07 mg/dL (ref 0.61–1.24)
GFR, Estimated: >60 mL/min
Glucose, Bld: 105 mg/dL — ABNORMAL HIGH (ref 70–99)
Potassium: 4.5 mmol/L (ref 3.5–5.1)
Sodium: 140 mmol/L (ref 135–145)

## 2024-10-29 NOTE — ED Provider Notes (Signed)
 " [place] [facility] Provider Note   CSN: [id number] Arrival date & time: 10/29/24  9144     Patient presents with: Cough   Allen Sawyer is a 63 y.o. male.   This is a 63 year old male presenting the emergency department with fatigue.  Reports viral stomach bug Christmas, generalized malaise, watery diarrhea, some congestion and has had a dry cough as well.  Reports no energy today and feels more sleepy.  Does have history of asthma not been using his inhalers also obstructive sleep apnea, not using his CPAP machine.  Denies fever, chest pain, shortness of breath, dyspnea on exertion, orthopnea, no blood in the stools.   Cough      Prior to Admission medications  Medication Sig Start Date End Date Taking? Authorizing Provider  Albuterol -Budesonide  (AIRSUPRA ) 90-80 MCG/ACT AERO Inhale 2 puffs into the lungs every 4 (four) hours as needed. 04/27/23   Marinda Rocky SAILOR, MD  Azelastine -Fluticasone  (DYMISTA ) 137-50 MCG/ACT SUSP Place 1 spray into both nostrils 2 (two) times daily as needed. 12/01/23   Frann Mabel Mt, DO  budesonide -formoterol  (SYMBICORT ) 160-4.5 MCG/ACT inhaler Inhale 2 puffs into the lungs 2 (two) times daily. Rinse mouth out after use. 08/05/24   Frann Mabel Mt, DO  levocetirizine (XYZAL ) 5 MG tablet Take 1 tablet (5 mg total) by mouth every evening. 08/05/24   Frann Mabel Mt, DO  montelukast  (SINGULAIR ) 10 MG tablet Take 1 tablet (10 mg total) by mouth at bedtime. 08/05/24   Frann Mabel Mt, DO  Olopatadine  HCl 0.2 % SOLN Apply 1 drop to eye as needed. 04/27/23   Marinda Rocky SAILOR, MD    Allergies: Patient has no known allergies.    Review of Systems  Respiratory:  Positive for cough.     Updated Vital Signs BP (!) 148/86   Pulse 70   Temp 97.8 F (36.6 C) (Oral)   Resp 16   Wt 108.9 kg   SpO2 99%   BMI 30.81 kg/m   Physical Exam Vitals and nursing note reviewed.  Constitutional:       General: He is not in acute distress.    Appearance: He is obese.  HENT:     Mouth/Throat:     Mouth: Mucous membranes are moist.  Eyes:     Conjunctiva/sclera: Conjunctivae normal.  Cardiovascular:     Rate and Rhythm: Normal rate and regular rhythm.  Pulmonary:     Effort: Pulmonary effort is normal.     Breath sounds: Normal breath sounds. No wheezing, rhonchi or rales.  Abdominal:     General: Abdomen is flat.  Musculoskeletal:     Right lower leg: No edema.     Left lower leg: No edema.  Skin:    General: Skin is warm.     Capillary Refill: Capillary refill takes less than 2 seconds.  Neurological:     Mental Status: He is alert and oriented to person, place, and time.  Psychiatric:        Mood and Affect: Mood normal.        Behavior: Behavior normal.     (all labs ordered are listed, but only abnormal results are displayed) Labs Reviewed  BASIC METABOLIC PANEL WITH GFR - Abnormal; Notable for the following components:      Result Value   Glucose, Bld 105 (*)    All other components within normal limits  CBC    EKG: EKG Interpretation Date/Time:  Tuesday October 29 2024  11:39:45 EST Ventricular Rate:  58 PR Interval:  170 QRS Duration:  98 QT Interval:  470 QTC Calculation: 462 R Axis:   74  Text Interpretation: Sinus rhythm Confirmed by Neysa Clap [phone]) on 10/29/2024 11:49:27 AM  Radiology: DG Chest 2 View Result Date: 10/29/2024 CLINICAL DATA:  Productive cough EXAM: DG CHEST 2V COMPARISON:  December 08, 2023 FINDINGS: The heart size and mediastinal contours are within normal limits. Both lungs are clear. The visualized skeletal structures are unremarkable. IMPRESSION: No active cardiopulmonary disease. Electronically Signed   By: Lynwood Landy Raddle M.D.   On: 10/29/2024 11:46     Procedures   Medications Ordered in the ED - No data to display  Clinical Course as of 10/29/24 1517  Tue Oct 29, 2024  1149 DG Chest 2 View IMPRESSION: No active  cardiopulmonary disease.   Electronically Signed   [TY]  1149 CBC No anemia to explain his fatigue..  No leukocytosis either [TY]  1155 Basic metabolic panel(!) No electrolyte abnormalities to explain his fatigue.  Normal kidney function. [TY]    Clinical Course User Index [TY] Neysa Clap PARAS, DO                                 Medical Decision Making This is a 63 year old male presenting emergency department with generalized malaise/fatigue.  He had what sounds like viral GI bug, possibly flu and has had residual fatigue and decreased energy since that time.  He is afebrile here nontachycardic, slightly hypertensive.  He is complaining of a cough.  Chest x-ray ordered.  Given his vague symptoms we will get basic screening labs to evaluate for anemia or metabolic derangements.  However, patient is clinically well-appearing.  If negative labs and x-ray will have him follow-up with PCP.  Labs and x-ray negative.  Discharged in stable condition  Amount and/or Complexity of Data Reviewed External Data Reviewed:     Details: History of asthma and sleep apnea.  No prior cardiac testing on file Labs: ordered. Decision-making details documented in ED Course.    Details: See ED course Radiology: ordered and independent interpretation performed. Decision-making details documented in ED Course.    Details: Chest x-ray without pneumothorax, no widened mediastinum on my independent review ECG/medicine tests: ordered and independent interpretation performed. Decision-making details documented in ED Course.  Risk Decision regarding hospitalization. Diagnosis or treatment significantly limited by social determinants of health.       Final diagnoses:  Other fatigue    ED Discharge Orders     None          Neysa Clap PARAS, DO 10/29/24 1517  "

## 2024-10-29 NOTE — Discharge Instructions (Addendum)
 Your lab work and imaging are all reassuring.  Does not appear to be a life-threatening or emergent cause for your symptoms today.  Please follow-up with your primary doctor soon as possible.  He may take over-the-counter medication such as Tylenol  ibuprofen , this may help with your symptoms.  Try to increase your fluid intake.  Use your CPAP machine and inhalers.  Return for fevers, chills, severe headache, facial droop, unilateral weakness, chest pain, shortness of breath, pass out, uncontrolled nausea vomiting or severe pain.  Or, please return if develop any new or worsening symptoms that are concerning to you.

## 2024-10-29 NOTE — Progress Notes (Signed)
 Ordered EKG completed and hand delivered to MD Young at approximately 1142- Charge RN aware.

## 2024-10-29 NOTE — ED Triage Notes (Signed)
 Pt reports cough since Christmas. Productive cough with clear mucous. Feels SOB when leaning over, but has not felt he has needed inhaler. Ears feel congested Feels weak and sleepy.Unable to work today due to feeling to sleepy and weak. Normally works out, but unable to work out

## 2024-11-08 ENCOUNTER — Encounter: Payer: Self-pay | Admitting: Family Medicine

## 2024-11-08 ENCOUNTER — Other Ambulatory Visit (HOSPITAL_BASED_OUTPATIENT_CLINIC_OR_DEPARTMENT_OTHER): Payer: Self-pay

## 2024-11-08 ENCOUNTER — Ambulatory Visit: Payer: Self-pay | Admitting: Family Medicine

## 2024-11-08 ENCOUNTER — Ambulatory Visit: Payer: Self-pay | Admitting: *Deleted

## 2024-11-08 VITALS — BP 132/73 | HR 71 | Ht 74.0 in | Wt 244.0 lb

## 2024-11-08 DIAGNOSIS — J4530 Mild persistent asthma, uncomplicated: Secondary | ICD-10-CM | POA: Diagnosis not present

## 2024-11-08 DIAGNOSIS — S39011A Strain of muscle, fascia and tendon of abdomen, initial encounter: Secondary | ICD-10-CM | POA: Diagnosis not present

## 2024-11-08 DIAGNOSIS — J302 Other seasonal allergic rhinitis: Secondary | ICD-10-CM | POA: Diagnosis not present

## 2024-11-08 MED ORDER — MONTELUKAST SODIUM 10 MG PO TABS
10.0000 mg | ORAL_TABLET | Freq: Every day | ORAL | 3 refills | Status: AC
  Filled 2024-11-08: qty 30, 30d supply, fill #0

## 2024-11-08 MED ORDER — LEVOCETIRIZINE DIHYDROCHLORIDE 5 MG PO TABS
5.0000 mg | ORAL_TABLET | Freq: Every evening | ORAL | 3 refills | Status: AC
  Filled 2024-11-08: qty 30, 30d supply, fill #0

## 2024-11-08 NOTE — Assessment & Plan Note (Signed)
 Stable. Refills provided.

## 2024-11-08 NOTE — Progress Notes (Signed)
 "  Acute Office Visit  Subjective:  Patient ID: Allen Sawyer, male    DOB: 12/26/1960  Age: 64 y.o. MRN: [id number]  CC:  Chief Complaint  Patient presents with   Flank Pain      HPI Allen Sawyer is here for left groin pain.     Discussed the use of AI scribe software for clinical note transcription with the patient, who gave verbal consent to proceed.  History of Present Illness Allen Sawyer is a 64 year old male who presents with groin pain after performing sit-ups.  He has groin/abdominal pain localized to the LLQ described as sore and achy. The pain began after performing sit-ups last week and is most pronounced when transitioning from sitting to standing, but it loosens up after walking around. No back pain, and urination is normal with no changes in urine color.  He experiences leg cramps, which he attributes to sweating a lot during the day. He reports not hydrating as much as he should. Recent blood work was normal.  He is currently out of his allergy  medications, Xyzal  and Singulair , and requests refills.      Past Medical History:  Diagnosis Date   Asthma    Sleep apnea     Past Surgical History:  Procedure Laterality Date   APPENDECTOMY      Family History  Problem Relation Age of Onset   Hyperlipidemia Mother    Hypertension Mother    Hyperlipidemia Father    Hypertension Father    Heart attack Father    Eczema Sister    Hyperlipidemia Sister    Hypertension Sister    Hypertension Brother    Hyperlipidemia Brother    Diabetes Brother    Heart attack Paternal Uncle    Sudden death Neg Hx     Social History   Socioeconomic History   Marital status: Single    Spouse name: Not on file   Number of children: Not on file   Years of education: Not on file   Highest education level: Not on file  Occupational History   Not on file  Tobacco Use   Smoking status: Some Days    Current packs/day: 0.25    Types: Cigarettes, Cigars    Passive  exposure: Never   Smokeless tobacco: Never  Vaping Use   Vaping status: Never Used  Substance and Sexual Activity   Alcohol use: Yes    Alcohol/week: 1.0 - 2.0 standard drink of alcohol    Types: 1 - 2 Shots of liquor per week    Comment: occ   Drug use: No   Sexual activity: Not Currently  Other Topics Concern   Not on file  Social History Narrative   Not on file   Social Drivers of Health   Tobacco Use: High Risk (11/08/2024)   Patient History    Smoking Tobacco Use: Some Days    Smokeless Tobacco Use: Never    Passive Exposure: Never  Financial Resource Strain: Not on file  Food Insecurity: Not on file  Transportation Needs: Not on file  Physical Activity: Not on file  Stress: Not on file  Social Connections: Not on file  Intimate Partner Violence: Not on file  Depression (EYV7-0): Medium Risk (08/05/2024)   Depression (PHQ2-9)    PHQ-2 Score: 8  Alcohol Screen: Not on file  Housing: Not on file  Utilities: Not on file  Health Literacy: Not on file    ROS All ROS negative except what is  listed in the HPI.   Objective:   Today's Vitals: BP 132/73   Pulse 71   Ht 6' 2 (1.88 m)   Wt 244 lb (110.7 kg)   SpO2 98%   BMI 31.33 kg/m   Physical Exam Vitals reviewed.  Constitutional:      Appearance: Normal appearance.  Abdominal:     General: There is no distension.     Palpations: There is no mass.     Tenderness: There is no guarding.  Musculoskeletal:     Comments: Left lower abdominal muscle discomfort with position changes, otherwise no abdominal pain  Skin:    General: Skin is warm and dry.  Neurological:     Mental Status: He is alert and oriented to person, place, and time.  Psychiatric:        Mood and Affect: Mood normal.        Behavior: Behavior normal.        Thought Content: Thought content normal.        Judgment: Judgment normal.          Assessment & Plan:   Problem List Items Addressed This Visit       Active Problems    Seasonal allergies   Stable. Refills provided.       Relevant Medications   levocetirizine (XYZAL ) 5 MG tablet   montelukast  (SINGULAIR ) 10 MG tablet   Mild persistent asthma without complication   Stable. Refills provided.       Relevant Medications   montelukast  (SINGULAIR ) 10 MG tablet   Other Visit Diagnoses       Strain of abdominal muscle, initial encounter    -  Primary      No GI or GU symptoms; declined urinary testing today. Aware of symptoms to monitor for.  Discussed treatment for strain - heating pad, gentle stretching (handout provided), rest, no heavy lifting, as needed tylenol /ibuprofen . Requesting work note for today and tomorrow's shifts.   Patient aware of signs/symptoms requiring further/urgent evaluation.     Follow-up: Return if symptoms worsen or fail to improve.   Waddell FURY Almarie, DNP, FNP-C  I,Emily Lagle,acting as a neurosurgeon for Waddell KATHEE Almarie, NP.,have documented all relevant documentation on the behalf of Waddell KATHEE Almarie, NP.   I, Waddell KATHEE Almarie, NP, have reviewed all documentation for this visit. The documentation on 11/08/2024 for the exam, diagnosis, procedures, and orders are all accurate and complete. "

## 2024-11-08 NOTE — Telephone Encounter (Signed)
 Patient requesting appt today due to requesting return to work note or note for work.      FYI Only or Action Required?: FYI only for provider: appointment scheduled on 11/08/24.  Patient was last seen in primary care on 10/01/2024 by Daryl Setter, NP.  Called Nurse Triage reporting Flank Pain.  Symptoms began a week ago.  Interventions attempted: Rest, hydration, or home remedies.  Symptoms are: unchanged.  Triage Disposition: See Physician Within 24 Hours  Patient/caregiver understands and will follow disposition?: Yes            Copied from CRM [id number]. Topic: Clinical - Red Word Triage >> Nov 08, 2024  7:39 AM Carlyon D wrote: Red Word that prompted transfer to Nurse Triage: Pain in the left lower back, pain level right now is a 5 pt states the pain is worse when standing up. Reason for Disposition  MODERATE pain (e.g., interferes with normal activities or awakens from sleep)  Answer Assessment - Initial Assessment Questions 1. LOCATION: Where does it hurt? (e.g., left, right)     Left flank low abdominal area 2. ONSET: When did the pain start?     1 week ago  3. SEVERITY: How bad is the pain? (e.g., Scale 1-10; mild, moderate, or severe)     5/10 4. PATTERN: Does the pain come and go, or is it constant?      Comes and goes with standing  5. CAUSE: What do you think is causing the pain?     Possible from working out  6. OTHER SYMPTOMS:  Do you have any other symptoms? (e.g., fever, abdomen pain, vomiting, leg weakness, burning with urination, blood in urine)     Left side low abdominal area. When standing. No fever no N/V no pain with urination , no difficulty breathing reported.  7. PREGNANCY:  Is there any chance you are pregnant? When was your last menstrual period?     na  Protocols used: Flank Pain-A-AH

## 2024-11-20 ENCOUNTER — Other Ambulatory Visit (HOSPITAL_BASED_OUTPATIENT_CLINIC_OR_DEPARTMENT_OTHER): Payer: Self-pay
